# Patient Record
Sex: Female | Born: 1955 | Race: White | Hispanic: No | Smoking: Former smoker
Health system: Southern US, Community
[De-identification: ages and names within clinical notes are randomized; demographics above are authoritative.]

## PROBLEM LIST (undated history)

## (undated) DIAGNOSIS — R112 Nausea with vomiting, unspecified: Secondary | ICD-10-CM

## (undated) DIAGNOSIS — D649 Anemia, unspecified: Secondary | ICD-10-CM

## (undated) DIAGNOSIS — E039 Hypothyroidism, unspecified: Secondary | ICD-10-CM

## (undated) DIAGNOSIS — G4730 Sleep apnea, unspecified: Secondary | ICD-10-CM

## (undated) DIAGNOSIS — M1990 Unspecified osteoarthritis, unspecified site: Secondary | ICD-10-CM

## (undated) DIAGNOSIS — F329 Major depressive disorder, single episode, unspecified: Secondary | ICD-10-CM

## (undated) DIAGNOSIS — F32A Depression, unspecified: Secondary | ICD-10-CM

## (undated) DIAGNOSIS — Z9889 Other specified postprocedural states: Secondary | ICD-10-CM

## (undated) DIAGNOSIS — M5126 Other intervertebral disc displacement, lumbar region: Secondary | ICD-10-CM

## (undated) DIAGNOSIS — T4145XA Adverse effect of unspecified anesthetic, initial encounter: Secondary | ICD-10-CM

## (undated) DIAGNOSIS — Z8719 Personal history of other diseases of the digestive system: Secondary | ICD-10-CM

## (undated) DIAGNOSIS — E079 Disorder of thyroid, unspecified: Secondary | ICD-10-CM

## (undated) DIAGNOSIS — Z9289 Personal history of other medical treatment: Secondary | ICD-10-CM

## (undated) DIAGNOSIS — E785 Hyperlipidemia, unspecified: Secondary | ICD-10-CM

## (undated) DIAGNOSIS — T8859XA Other complications of anesthesia, initial encounter: Secondary | ICD-10-CM

## (undated) DIAGNOSIS — T7840XA Allergy, unspecified, initial encounter: Secondary | ICD-10-CM

## (undated) DIAGNOSIS — K219 Gastro-esophageal reflux disease without esophagitis: Secondary | ICD-10-CM

## (undated) DIAGNOSIS — R251 Tremor, unspecified: Secondary | ICD-10-CM

## (undated) DIAGNOSIS — F419 Anxiety disorder, unspecified: Secondary | ICD-10-CM

## (undated) HISTORY — PX: ABDOMINAL HYSTERECTOMY: SHX81

## (undated) HISTORY — DX: Allergy, unspecified, initial encounter: T78.40XA

## (undated) HISTORY — DX: Hyperlipidemia, unspecified: E78.5

## (undated) HISTORY — DX: Disorder of thyroid, unspecified: E07.9

## (undated) HISTORY — PX: CHOLECYSTECTOMY: SHX55

## (undated) HISTORY — PX: TUBAL LIGATION: SHX77

## (undated) HISTORY — DX: Sleep apnea, unspecified: G47.30

## (undated) HISTORY — DX: Tremor, unspecified: R25.1

## (undated) HISTORY — DX: Depression, unspecified: F32.A

## (undated) HISTORY — DX: Major depressive disorder, single episode, unspecified: F32.9

## (undated) HISTORY — PX: LIPOMA EXCISION: SHX5283

## (undated) HISTORY — PX: OOPHORECTOMY: SHX86

## (undated) HISTORY — DX: Anemia, unspecified: D64.9

## (undated) HISTORY — DX: Anxiety disorder, unspecified: F41.9

## (undated) HISTORY — DX: Personal history of other medical treatment: Z92.89

## (undated) HISTORY — DX: Other intervertebral disc displacement, lumbar region: M51.26

## (undated) HISTORY — PX: EYE SURGERY: SHX253

---

## 1998-01-22 ENCOUNTER — Other Ambulatory Visit: Admission: RE | Admit: 1998-01-22 | Discharge: 1998-01-22 | Payer: Self-pay | Admitting: Obstetrics and Gynecology

## 1998-06-26 ENCOUNTER — Ambulatory Visit (HOSPITAL_COMMUNITY): Admission: RE | Admit: 1998-06-26 | Discharge: 1998-06-26 | Payer: Self-pay | Admitting: Family Medicine

## 1998-06-26 ENCOUNTER — Encounter: Payer: Self-pay | Admitting: Family Medicine

## 1998-11-26 ENCOUNTER — Ambulatory Visit (HOSPITAL_COMMUNITY): Admission: RE | Admit: 1998-11-26 | Discharge: 1998-11-26 | Payer: Self-pay | Admitting: Gastroenterology

## 1998-11-26 ENCOUNTER — Encounter: Payer: Self-pay | Admitting: Gastroenterology

## 1998-11-28 ENCOUNTER — Other Ambulatory Visit: Admission: RE | Admit: 1998-11-28 | Discharge: 1998-11-28 | Payer: Self-pay | Admitting: Obstetrics and Gynecology

## 1998-12-16 ENCOUNTER — Ambulatory Visit (HOSPITAL_COMMUNITY): Admission: RE | Admit: 1998-12-16 | Discharge: 1998-12-17 | Payer: Self-pay | Admitting: General Surgery

## 1998-12-16 ENCOUNTER — Encounter: Payer: Self-pay | Admitting: General Surgery

## 1999-05-29 ENCOUNTER — Encounter: Admission: RE | Admit: 1999-05-29 | Discharge: 1999-05-29 | Payer: Self-pay | Admitting: Obstetrics and Gynecology

## 1999-09-02 ENCOUNTER — Ambulatory Visit (HOSPITAL_COMMUNITY): Admission: RE | Admit: 1999-09-02 | Discharge: 1999-09-02 | Payer: Self-pay | Admitting: Family Medicine

## 1999-09-02 ENCOUNTER — Encounter: Payer: Self-pay | Admitting: Family Medicine

## 1999-11-06 ENCOUNTER — Encounter: Payer: Self-pay | Admitting: Family Medicine

## 1999-11-06 ENCOUNTER — Ambulatory Visit: Admission: RE | Admit: 1999-11-06 | Discharge: 1999-11-06 | Payer: Self-pay | Admitting: Family Medicine

## 2000-02-27 ENCOUNTER — Other Ambulatory Visit: Admission: RE | Admit: 2000-02-27 | Discharge: 2000-02-27 | Payer: Self-pay | Admitting: Obstetrics and Gynecology

## 2000-05-31 ENCOUNTER — Encounter: Payer: Self-pay | Admitting: Obstetrics and Gynecology

## 2000-05-31 ENCOUNTER — Encounter: Admission: RE | Admit: 2000-05-31 | Discharge: 2000-05-31 | Payer: Self-pay | Admitting: Obstetrics and Gynecology

## 2000-10-10 ENCOUNTER — Ambulatory Visit (HOSPITAL_COMMUNITY): Admission: RE | Admit: 2000-10-10 | Discharge: 2000-10-10 | Payer: Self-pay | Admitting: Family Medicine

## 2000-10-10 ENCOUNTER — Encounter: Payer: Self-pay | Admitting: Family Medicine

## 2000-10-15 DIAGNOSIS — M5126 Other intervertebral disc displacement, lumbar region: Secondary | ICD-10-CM

## 2000-10-15 HISTORY — DX: Other intervertebral disc displacement, lumbar region: M51.26

## 2000-11-08 ENCOUNTER — Ambulatory Visit (HOSPITAL_COMMUNITY): Admission: RE | Admit: 2000-11-08 | Discharge: 2000-11-08 | Payer: Self-pay | Admitting: Neurosurgery

## 2000-11-08 ENCOUNTER — Encounter: Payer: Self-pay | Admitting: Neurosurgery

## 2000-11-29 ENCOUNTER — Encounter: Payer: Self-pay | Admitting: Neurosurgery

## 2000-11-29 ENCOUNTER — Encounter: Admission: RE | Admit: 2000-11-29 | Discharge: 2000-11-29 | Payer: Self-pay | Admitting: Neurosurgery

## 2000-12-13 ENCOUNTER — Encounter: Admission: RE | Admit: 2000-12-13 | Discharge: 2000-12-13 | Payer: Self-pay | Admitting: Neurosurgery

## 2000-12-13 ENCOUNTER — Encounter: Payer: Self-pay | Admitting: Neurosurgery

## 2001-04-08 ENCOUNTER — Other Ambulatory Visit: Admission: RE | Admit: 2001-04-08 | Discharge: 2001-04-08 | Payer: Self-pay | Admitting: Obstetrics and Gynecology

## 2001-05-31 ENCOUNTER — Encounter: Admission: RE | Admit: 2001-05-31 | Discharge: 2001-05-31 | Payer: Self-pay | Admitting: Obstetrics and Gynecology

## 2001-05-31 ENCOUNTER — Encounter: Payer: Self-pay | Admitting: Obstetrics and Gynecology

## 2002-05-12 ENCOUNTER — Other Ambulatory Visit: Admission: RE | Admit: 2002-05-12 | Discharge: 2002-05-12 | Payer: Self-pay | Admitting: Obstetrics and Gynecology

## 2002-06-02 ENCOUNTER — Encounter: Admission: RE | Admit: 2002-06-02 | Discharge: 2002-06-02 | Payer: Self-pay | Admitting: Obstetrics and Gynecology

## 2002-06-02 ENCOUNTER — Encounter: Payer: Self-pay | Admitting: Obstetrics and Gynecology

## 2002-09-07 ENCOUNTER — Emergency Department (HOSPITAL_COMMUNITY): Admission: EM | Admit: 2002-09-07 | Discharge: 2002-09-07 | Payer: Self-pay | Admitting: Emergency Medicine

## 2003-03-03 ENCOUNTER — Inpatient Hospital Stay (HOSPITAL_COMMUNITY): Admission: EM | Admit: 2003-03-03 | Discharge: 2003-03-05 | Payer: Self-pay | Admitting: Podiatry

## 2003-03-03 ENCOUNTER — Encounter: Payer: Self-pay | Admitting: *Deleted

## 2003-04-18 ENCOUNTER — Encounter: Payer: Self-pay | Admitting: Family Medicine

## 2003-04-18 LAB — CONVERTED CEMR LAB

## 2003-04-30 ENCOUNTER — Other Ambulatory Visit: Admission: RE | Admit: 2003-04-30 | Discharge: 2003-04-30 | Payer: Self-pay | Admitting: Obstetrics and Gynecology

## 2003-06-04 ENCOUNTER — Encounter: Admission: RE | Admit: 2003-06-04 | Discharge: 2003-06-04 | Payer: Self-pay | Admitting: Obstetrics and Gynecology

## 2003-06-04 ENCOUNTER — Encounter: Payer: Self-pay | Admitting: Obstetrics and Gynecology

## 2004-06-13 ENCOUNTER — Ambulatory Visit (HOSPITAL_COMMUNITY): Admission: RE | Admit: 2004-06-13 | Discharge: 2004-06-13 | Payer: Self-pay | Admitting: Obstetrics and Gynecology

## 2004-06-16 ENCOUNTER — Ambulatory Visit: Payer: Self-pay | Admitting: Family Medicine

## 2004-06-23 ENCOUNTER — Ambulatory Visit: Payer: Self-pay | Admitting: Family Medicine

## 2004-07-22 ENCOUNTER — Ambulatory Visit: Payer: Self-pay | Admitting: Family Medicine

## 2004-07-23 ENCOUNTER — Ambulatory Visit: Payer: Self-pay | Admitting: Internal Medicine

## 2004-08-05 ENCOUNTER — Ambulatory Visit: Payer: Self-pay | Admitting: Family Medicine

## 2004-11-11 ENCOUNTER — Other Ambulatory Visit: Admission: RE | Admit: 2004-11-11 | Discharge: 2004-11-11 | Payer: Self-pay | Admitting: Obstetrics and Gynecology

## 2005-02-05 ENCOUNTER — Ambulatory Visit: Payer: Self-pay | Admitting: Family Medicine

## 2005-03-17 HISTORY — PX: BLADDER SUSPENSION: SHX72

## 2005-04-01 ENCOUNTER — Observation Stay (HOSPITAL_COMMUNITY): Admission: RE | Admit: 2005-04-01 | Discharge: 2005-04-02 | Payer: Self-pay | Admitting: Obstetrics and Gynecology

## 2005-05-27 ENCOUNTER — Ambulatory Visit: Payer: Self-pay | Admitting: Family Medicine

## 2005-07-01 ENCOUNTER — Ambulatory Visit (HOSPITAL_COMMUNITY): Admission: RE | Admit: 2005-07-01 | Discharge: 2005-07-01 | Payer: Self-pay | Admitting: Obstetrics and Gynecology

## 2005-08-20 ENCOUNTER — Ambulatory Visit: Payer: Self-pay | Admitting: Family Medicine

## 2005-09-11 ENCOUNTER — Ambulatory Visit: Payer: Self-pay | Admitting: Family Medicine

## 2005-09-14 ENCOUNTER — Encounter: Admission: RE | Admit: 2005-09-14 | Discharge: 2005-09-14 | Payer: Self-pay | Admitting: General Surgery

## 2005-10-02 ENCOUNTER — Ambulatory Visit: Payer: Self-pay | Admitting: Family Medicine

## 2005-10-20 ENCOUNTER — Ambulatory Visit: Payer: Self-pay | Admitting: Family Medicine

## 2006-03-08 ENCOUNTER — Ambulatory Visit: Payer: Self-pay | Admitting: Family Medicine

## 2006-04-21 ENCOUNTER — Ambulatory Visit: Payer: Self-pay | Admitting: Family Medicine

## 2006-07-15 ENCOUNTER — Encounter: Admission: RE | Admit: 2006-07-15 | Discharge: 2006-07-15 | Payer: Self-pay | Admitting: Obstetrics and Gynecology

## 2007-02-02 ENCOUNTER — Telehealth: Payer: Self-pay | Admitting: Family Medicine

## 2007-02-03 ENCOUNTER — Encounter: Payer: Self-pay | Admitting: Family Medicine

## 2007-02-03 DIAGNOSIS — F329 Major depressive disorder, single episode, unspecified: Secondary | ICD-10-CM

## 2007-02-03 DIAGNOSIS — E039 Hypothyroidism, unspecified: Secondary | ICD-10-CM

## 2007-02-03 DIAGNOSIS — E785 Hyperlipidemia, unspecified: Secondary | ICD-10-CM

## 2007-02-03 DIAGNOSIS — G4700 Insomnia, unspecified: Secondary | ICD-10-CM

## 2007-02-03 DIAGNOSIS — K589 Irritable bowel syndrome without diarrhea: Secondary | ICD-10-CM

## 2007-02-15 ENCOUNTER — Ambulatory Visit: Payer: Self-pay | Admitting: Family Medicine

## 2007-02-16 ENCOUNTER — Encounter: Payer: Self-pay | Admitting: Family Medicine

## 2007-04-01 ENCOUNTER — Ambulatory Visit: Payer: Self-pay | Admitting: Internal Medicine

## 2007-04-01 DIAGNOSIS — B029 Zoster without complications: Secondary | ICD-10-CM | POA: Insufficient documentation

## 2007-04-05 ENCOUNTER — Telehealth (INDEPENDENT_AMBULATORY_CARE_PROVIDER_SITE_OTHER): Payer: Self-pay | Admitting: *Deleted

## 2007-04-27 ENCOUNTER — Ambulatory Visit: Payer: Self-pay | Admitting: Family Medicine

## 2007-04-29 ENCOUNTER — Encounter (INDEPENDENT_AMBULATORY_CARE_PROVIDER_SITE_OTHER): Payer: Self-pay | Admitting: *Deleted

## 2007-05-04 ENCOUNTER — Telehealth: Payer: Self-pay | Admitting: Family Medicine

## 2007-05-04 LAB — CONVERTED CEMR LAB
LDL Cholesterol: 98 mg/dL (ref 0–99)
TSH: 7.63 microintl units/mL — ABNORMAL HIGH (ref 0.35–5.50)
Total CHOL/HDL Ratio: 3.3

## 2007-06-13 ENCOUNTER — Encounter: Payer: Self-pay | Admitting: Family Medicine

## 2007-07-18 ENCOUNTER — Encounter: Admission: RE | Admit: 2007-07-18 | Discharge: 2007-07-18 | Payer: Self-pay | Admitting: Obstetrics and Gynecology

## 2007-07-21 ENCOUNTER — Ambulatory Visit: Payer: Self-pay | Admitting: Internal Medicine

## 2007-08-24 ENCOUNTER — Ambulatory Visit: Payer: Self-pay | Admitting: Family Medicine

## 2007-08-24 DIAGNOSIS — M79609 Pain in unspecified limb: Secondary | ICD-10-CM

## 2007-08-25 ENCOUNTER — Ambulatory Visit: Payer: Self-pay | Admitting: Family Medicine

## 2007-09-27 ENCOUNTER — Encounter: Payer: Self-pay | Admitting: Family Medicine

## 2007-09-28 ENCOUNTER — Ambulatory Visit: Payer: Self-pay | Admitting: Family Medicine

## 2007-09-28 DIAGNOSIS — H02409 Unspecified ptosis of unspecified eyelid: Secondary | ICD-10-CM | POA: Insufficient documentation

## 2007-10-31 ENCOUNTER — Telehealth: Payer: Self-pay | Admitting: Family Medicine

## 2007-11-11 ENCOUNTER — Encounter: Payer: Self-pay | Admitting: Family Medicine

## 2007-12-29 ENCOUNTER — Ambulatory Visit: Payer: Self-pay | Admitting: Family Medicine

## 2008-01-02 LAB — CONVERTED CEMR LAB
Free T4: 1.22 ng/dL (ref 0.89–1.80)
T3 Uptake Ratio: 33.6 % (ref 22.5–37.0)

## 2008-01-18 ENCOUNTER — Encounter: Payer: Self-pay | Admitting: Family Medicine

## 2008-01-23 ENCOUNTER — Encounter: Payer: Self-pay | Admitting: Family Medicine

## 2008-01-30 ENCOUNTER — Encounter: Payer: Self-pay | Admitting: Family Medicine

## 2008-02-10 ENCOUNTER — Telehealth: Payer: Self-pay | Admitting: Family Medicine

## 2008-02-14 ENCOUNTER — Ambulatory Visit: Payer: Self-pay | Admitting: Family Medicine

## 2008-03-13 ENCOUNTER — Telehealth (INDEPENDENT_AMBULATORY_CARE_PROVIDER_SITE_OTHER): Payer: Self-pay | Admitting: *Deleted

## 2008-03-14 ENCOUNTER — Ambulatory Visit: Payer: Self-pay | Admitting: Family Medicine

## 2008-03-14 DIAGNOSIS — M549 Dorsalgia, unspecified: Secondary | ICD-10-CM | POA: Insufficient documentation

## 2008-03-14 LAB — CONVERTED CEMR LAB
Bilirubin Urine: NEGATIVE
Ketones, urine, test strip: NEGATIVE
Mucus, UA: 0
Specific Gravity, Urine: 1.01
Urine crystals, microscopic: 0 /hpf
WBC, UA: 1 cells/hpf
Yeast, UA: 0
pH: 5

## 2008-03-19 ENCOUNTER — Encounter: Payer: Self-pay | Admitting: Family Medicine

## 2008-07-18 ENCOUNTER — Ambulatory Visit: Payer: Self-pay | Admitting: Family Medicine

## 2008-07-27 ENCOUNTER — Ambulatory Visit: Payer: Self-pay | Admitting: Licensed Clinical Social Worker

## 2008-08-07 ENCOUNTER — Ambulatory Visit: Payer: Self-pay | Admitting: Licensed Clinical Social Worker

## 2008-08-22 ENCOUNTER — Ambulatory Visit: Payer: Self-pay | Admitting: Family Medicine

## 2008-08-24 ENCOUNTER — Ambulatory Visit: Payer: Self-pay | Admitting: Licensed Clinical Social Worker

## 2008-08-30 ENCOUNTER — Telehealth (INDEPENDENT_AMBULATORY_CARE_PROVIDER_SITE_OTHER): Payer: Self-pay | Admitting: Internal Medicine

## 2008-09-07 ENCOUNTER — Ambulatory Visit: Payer: Self-pay | Admitting: Licensed Clinical Social Worker

## 2008-10-01 ENCOUNTER — Telehealth (INDEPENDENT_AMBULATORY_CARE_PROVIDER_SITE_OTHER): Payer: Self-pay | Admitting: *Deleted

## 2009-02-21 ENCOUNTER — Ambulatory Visit: Payer: Self-pay | Admitting: Family Medicine

## 2009-02-22 LAB — CONVERTED CEMR LAB
ALT: 17 units/L (ref 0–35)
BUN: 10 mg/dL (ref 6–23)
Calcium: 8.8 mg/dL (ref 8.4–10.5)
Chloride: 106 meq/L (ref 96–112)
Creatinine, Ser: 0.7 mg/dL (ref 0.4–1.2)
Glucose, Bld: 90 mg/dL (ref 70–99)
Potassium: 4.4 meq/L (ref 3.5–5.1)
Total CHOL/HDL Ratio: 5
VLDL: 27.2 mg/dL (ref 0.0–40.0)

## 2009-05-29 ENCOUNTER — Ambulatory Visit: Payer: Self-pay | Admitting: Family Medicine

## 2009-05-30 LAB — CONVERTED CEMR LAB
Total CHOL/HDL Ratio: 7
Triglycerides: 224 mg/dL — ABNORMAL HIGH (ref 0.0–149.0)

## 2009-06-11 ENCOUNTER — Ambulatory Visit: Payer: Self-pay | Admitting: Family Medicine

## 2009-06-12 ENCOUNTER — Telehealth (INDEPENDENT_AMBULATORY_CARE_PROVIDER_SITE_OTHER): Payer: Self-pay | Admitting: Internal Medicine

## 2009-07-16 ENCOUNTER — Ambulatory Visit: Payer: Self-pay | Admitting: Family Medicine

## 2009-07-16 ENCOUNTER — Encounter (INDEPENDENT_AMBULATORY_CARE_PROVIDER_SITE_OTHER): Payer: Self-pay | Admitting: Internal Medicine

## 2009-07-18 LAB — CONVERTED CEMR LAB
ALT: 19 units/L (ref 0–35)
AST: 19 units/L (ref 0–37)
HDL: 58 mg/dL (ref >39)
LDL Cholesterol: 93 mg/dL (ref 0–99)
Total CHOL/HDL Ratio: 2.9
VLDL: 15 mg/dL (ref 0–40)

## 2010-01-22 ENCOUNTER — Ambulatory Visit: Payer: Self-pay | Admitting: Family Medicine

## 2010-01-22 LAB — CONVERTED CEMR LAB
Cholesterol: 186 mg/dL (ref 0–200)
LDL Cholesterol: 110 mg/dL — ABNORMAL HIGH (ref 0–99)
Triglycerides: 106 mg/dL (ref 0.0–149.0)

## 2010-02-28 ENCOUNTER — Ambulatory Visit: Payer: Self-pay | Admitting: Family Medicine

## 2010-03-17 DIAGNOSIS — D649 Anemia, unspecified: Secondary | ICD-10-CM

## 2010-03-17 HISTORY — DX: Anemia, unspecified: D64.9

## 2010-03-31 ENCOUNTER — Encounter: Payer: Self-pay | Admitting: Family Medicine

## 2010-03-31 ENCOUNTER — Ambulatory Visit: Payer: Self-pay | Admitting: Family Medicine

## 2010-03-31 DIAGNOSIS — D649 Anemia, unspecified: Secondary | ICD-10-CM

## 2010-04-01 LAB — CONVERTED CEMR LAB
Basophils Relative: 1 % (ref 0–1)
Eosinophils Relative: 1 % (ref 0–5)
Lymphocytes Relative: 39 % (ref 12–46)
MCHC: 32.7 g/dL (ref 30.0–36.0)
Monocytes Absolute: 0.5 10*3/uL (ref 0.1–1.0)
Monocytes Relative: 8 % (ref 3–12)
Neutro Abs: 3.3 10*3/uL (ref 1.7–7.7)
Neutrophils Relative %: 52 % (ref 43–77)
Platelets: 300 10*3/uL (ref 150–400)
RDW: 12.3 % (ref 11.5–15.5)
WBC: 6.4 10*3/uL (ref 4.0–10.5)

## 2010-04-07 ENCOUNTER — Ambulatory Visit: Payer: Self-pay | Admitting: Family Medicine

## 2010-04-08 ENCOUNTER — Encounter (INDEPENDENT_AMBULATORY_CARE_PROVIDER_SITE_OTHER): Payer: Self-pay | Admitting: *Deleted

## 2010-04-29 ENCOUNTER — Encounter (INDEPENDENT_AMBULATORY_CARE_PROVIDER_SITE_OTHER): Payer: Self-pay | Admitting: *Deleted

## 2010-05-07 ENCOUNTER — Ambulatory Visit: Payer: Self-pay | Admitting: Family Medicine

## 2010-05-07 ENCOUNTER — Encounter: Payer: Self-pay | Admitting: Family Medicine

## 2010-05-08 ENCOUNTER — Ambulatory Visit: Payer: Self-pay | Admitting: Family Medicine

## 2010-05-22 ENCOUNTER — Encounter: Payer: Self-pay | Admitting: Internal Medicine

## 2010-05-27 ENCOUNTER — Encounter (INDEPENDENT_AMBULATORY_CARE_PROVIDER_SITE_OTHER): Payer: Self-pay | Admitting: *Deleted

## 2010-05-30 ENCOUNTER — Ambulatory Visit: Payer: Self-pay | Admitting: Internal Medicine

## 2010-06-11 ENCOUNTER — Telehealth: Payer: Self-pay | Admitting: Internal Medicine

## 2010-06-12 ENCOUNTER — Telehealth: Payer: Self-pay | Admitting: Family Medicine

## 2010-06-13 ENCOUNTER — Ambulatory Visit: Payer: Self-pay | Admitting: Internal Medicine

## 2010-06-13 LAB — HM COLONOSCOPY

## 2010-06-13 LAB — HM SIGMOIDOSCOPY

## 2010-06-16 ENCOUNTER — Encounter: Payer: Self-pay | Admitting: Internal Medicine

## 2010-09-07 ENCOUNTER — Encounter: Payer: Self-pay | Admitting: Obstetrics and Gynecology

## 2010-09-08 ENCOUNTER — Telehealth: Payer: Self-pay | Admitting: Family Medicine

## 2010-09-18 NOTE — Assessment & Plan Note (Signed)
Summary: ESTABH FROM BILLIE/DLO   Vital Signs:  Patient profile:   55 year old female Height:      63.25 inches Weight:      133.50 pounds BMI:     23.55 Temp:     98.3 degrees F oral Pulse rate:   80 / minute Pulse rhythm:   regular BP sitting:   104 / 60  (left arm) Cuff size:   large  Vitals Entered By: Delilah Shan CMA Kasheena Sambrano Dull) (February 28, 2010 11:25 AM) CC: Establish from BDB   History of Present Illness: Elevated Cholesterol: Using medications without problems:yes Muscle aches: no Other complaints: no  Hypothyroid.  needs refill of synthroid 112 micrograms.  due for labs.  No symptoms of overt under or over replacement.   depression/anxiety- stable on current meds.  symptoms increase when she has gone off the pristiq. Uses ativan as needed with good effect.  Doesn't need refills on these meds.   Problems Prior to Update: 1)  Back Pain  (ICD-724.5) 2)  Headache, L Sided Periorbital  (ICD-784.0) 3)  Unspecified Ptosis of Eyelid  (ICD-374.30) 4)  Leg Pain, Left  (ICD-729.5) 5)  Shingles  (ICD-053.9) 6)  Hx of Insomnia  (ICD-780.52) 7)  Hx of Ibs  (ICD-564.1) 8)  Hypothyroidism  (ICD-244.9) 9)  Hyperlipidemia  (ICD-272.4) 10)  Depression  (ICD-311)  Current Medications (verified): 1)  Synthroid 112 Mcg  Tabs (Levothyroxine Sodium) .... One By Mouth Every Day 2)  Ativan 2 Mg Tabs (Lorazepam) .... Take One By Mouth At Bedtime 3)  Pristiq 50 Mg Xr24h-Tab (Desvenlafaxine Succinate) .Marland Kitchen.. 1 Once Daily For Depresion By Mouth 4)  Calcium Carbonate-Vitamin D 600-400 Mg-Unit  Tabs (Calcium Carbonate-Vitamin D) .... Two Daily 5)  Multivitamins   Tabs (Multiple Vitamin) .... One Daily 6)  Flax   Oil (Flaxseed (Linseed)) .... Once Daily 7)  Simvastatin 20 Mg Tabs (Simvastatin) .... Take 1 Each Day  Allergies: 1)  ! * Evista 2)  Lipitor 3)  Paxil 4)  * Remeron 5)  Wellbutrin 6)  * Amitriptyline 7)  * Vytorin 8)  * Zegerid 9)  * Crestor  Past History:  Family  History: Last updated: 02/28/2010 Father: unknown Mother: HTN, DM, goiter, breast cancer, MI at age 29 Siblings: sister w/ breast ca  Social History: Last updated: 02/28/2010 Marital Status: Divorced Children: 2 Occupation  Research officer, political party at [employer] Former Smoker, quit  ~2006, was prev occ smoker From [place] Occ alcohol   Past Medical History: Depression Hyperlipidemia Hypothyroidism Gyn care per Dr. 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  Otherwise noncontributory.    Physical Exam  General:  GEN: nad, alert and oriented HEENT: mucous membranes moist NECK: supple w/o LA CV: rrr.  no murmur PULM: ctab, no inc wob ABD: soft, +bs EXT: no edema SKIN: no acute rash    Impression & Recommendations:  Problem # 1:  HYPOTHYROIDISM (ICD-244.9) Contact with labs.  Rx done today.   Her updated medication list for this problem includes:    Synthroid 112 Mcg Tabs (Levothyroxine sodium) ..... One by mouth every day  Orders: TLB-TSH (Thyroid Stimulating Hormone) (84443-TSH)  Problem #  2:  HYPERLIPIDEMIA (ICD-272.4) Stable,  continue current meds. Recent labs reviewed.  Her updated medication list for this problem includes:    Simvastatin 20 Mg Tabs (Simvastatin) .Marland Kitchen... Take 1 each day  Complete Medication List: 1)  Synthroid 112 Mcg Tabs (Levothyroxine sodium) .... One by mouth every day 2)  Ativan 2 Mg Tabs (Lorazepam) .... Take one by mouth at bedtime 3)  Pristiq 50 Mg Xr24h-tab (Desvenlafaxine succinate) .Marland Kitchen.. 1 once daily for depresion by mouth 4)  Calcium Carbonate-vitamin D 600-400 Mg-unit Tabs (Calcium carbonate-vitamin d) .... Two daily 5)   Multivitamins Tabs (Multiple vitamin) .... One daily 6)  Flax Oil (Flaxseed (linseed)) .... Once daily 7)  Simvastatin 20 Mg Tabs (Simvastatin) .... Take 1 each day  Patient Instructions: 1)  Please schedule a follow-up appointment as needed .  I would recheck your cholesterol and TSH in 12 months.  Don't change your meds.  I sent your synthroid to the pharmacy.  We'll contact you with your lab report.  Take care.  Prescriptions: SYNTHROID 112 MCG  TABS (LEVOTHYROXINE SODIUM) one by mouth every day  #90 x 3   Entered and Authorized by:   Crawford Givens MD   Signed by:   Crawford Givens MD on 02/28/2010   Method used:   Electronically to        [facility] [address] [phone].* (retail)       [address]       [place], [place]  [address]       Ph: [phone]       Fax: [phone]   RxID:   [id number]   Current Allergies (reviewed today): ! * EVISTA LIPITOR PAXIL * REMERON WELLBUTRIN * AMITRIPTYLINE * VYTORIN * ZEGERID * CRESTOR

## 2010-09-18 NOTE — Assessment & Plan Note (Signed)
Summary: DISCUSS ?LABWORK/CLE   Vital Signs:  Patient profile:   55 year old female Height:      63.25 inches Weight:      135 pounds BMI:     23.81 Temp:     98.1 degrees F oral Pulse rate:   76 / minute Pulse rhythm:   regular BP sitting:   112 / 60  (left arm) Cuff size:   regular  Vitals Entered By: Delilah Shan CMA Yaelis Scharfenberg Dull) (March 31, 2010 10:55 AM) CC: Discuss labs   History of Present Illness: Anemia- couldn't give blood last week due to anemia. Hgb 11.2 per patient.  Occ dizzy sensation, intermittent during last month.   H/o nosebleeds.  H/o hysterectomy.  No BRBPR.  No tar colored stools.  Fatigue noted by patient.  No other FH of anemia.   No aspirin, rare NSAIDS.  Never had colonoscopy.   Had been giving blood 2x/year.   Allergies: 1)  ! * Evista 2)  Lipitor 3)  Paxil 4)  * Remeron 5)  Wellbutrin 6)  * Amitriptyline 7)  * Vytorin 8)  * Zegerid 9)  * Crestor  Past History:  Past Medical History: Depression Hyperlipidemia Hypothyroidism Gyn care per Dr. Huntley Dec Anemia 03/2010  Review of Systems       See HPI.  Otherwise negative.    Physical Exam  General:  GEN: nad, alert and oriented HEENT: mucous membranes moist, tm wnl, nasal epithelium wnl NECK: supple w/o LA CV: rrr.  no murmur PULM: ctab, no inc wob ABD: soft, +bs EXT: no edema SKIN: no acute rash    Impression & Recommendations:  Problem # 1:  ANEMIA (ICD-285.9) New problem.  Will check labs and contact patient.  Check stool heme and refer to GI.  Would need colon CA screening even if she didn't have anemia.  Addendum- patient had mild episode of bleeding through the bandage after lab draw.  Quickly controlled and routine dressing applied with good effect.  tolerated well.  Orders: Specimen Handling ([address]) T-CBC w/Diff [phone]) T-Iron [phone]) T-Vitamin B12 [phone]) T- * Misc. Laboratory test [phone])  Complete Medication List: 1)  Synthroid 112 Mcg Tabs  (Levothyroxine sodium) .... One by mouth every day except 1/2 tab on sundays 2)  Ativan 2 Mg Tabs (Lorazepam) .... Take one by mouth at bedtime 3)  Pristiq 50 Mg Xr24h-tab (Desvenlafaxine succinate) .Marland Kitchen.. 1 once daily for depresion by mouth 4)  Calcium Carbonate-vitamin D 600-400 Mg-unit Tabs (Calcium carbonate-vitamin d) .... Two daily 5)  Multivitamins Tabs (Multiple vitamin) .... One daily 6)  Flax Oil (Flaxseed (linseed)) .... Once daily 7)  Simvastatin 20 Mg Tabs (Simvastatin) .... Take 1 each day  Other Orders: Gastroenterology Referral (GI)  Patient Instructions: 1)  See Shirlee Limerick about your referral before your leave today.   2)  We'll contact you with your lab report.   Current Allergies (reviewed today): ! * EVISTA LIPITOR PAXIL * REMERON WELLBUTRIN * AMITRIPTYLINE * VYTORIN * ZEGERID * CRESTOR

## 2010-09-18 NOTE — Letter (Signed)
Summary: [facility]: Immunoassay Fecal Occult Blood (iFOB) Order Form  [place] [facility]  [address] [place], [place] [address]   Phone: [phone]  Fax: [phone]      [facility]: Immunoassay Fecal Occult Blood (iFOB) Order Form   March 31, 2010 MRN: [id number]   Abigail Marks 08-13-1956   Physicican Name:_________duncan________________  Diagnosis Code:__________v76.49________________      Crawford Givens MD

## 2010-09-18 NOTE — Letter (Signed)
Summary: Patient Notice- Polyp Results  [facility]  [address] [place], [place] [address]   Phone: [phone]  Fax: [phone]        June 16, 2010 MRN: [id number]    Abigail Marks [address] [place], [place]  [address]    Dear Abigail Marks,  I am pleased to inform you that the colon polyp(s) removed during your recent colonoscopy was (were) found to be benign (no cancer detected) upon pathologic examination.The polyps were hyperplasctic ( not precancerous)  I recommend you have a repeat colonoscopy examination in 10_ years to look for recurrent polyps, as having colon polyps increases your risk for having recurrent polyps or even colon cancer in the future.  Should you develop new or worsening symptoms of abdominal pain, bowel habit changes or bleeding from the rectum or bowels, please schedule an evaluation with either your primary care physician or with me.  Additional information/recommendations:  __x No further action with gastroenterology is needed at this time. Please      follow-up with your primary care physician for your other healthcare      needs.  __ Please call [phone] to schedule a return visit to review your      situation.  __ Please keep your follow-up visit as already scheduled.  __ Continue treatment plan as outlined the day of your exam.  Please call us if you are having persistent problems or have questions about your condition that have not been fully answered at this time.  Sincerely,  Hart Carwin MD  This letter has been electronically signed by your physician.  Appended Document: Patient Notice- Polyp Results letter mailed

## 2010-09-18 NOTE — Letter (Signed)
Summary: [facility] Instructions  [facility]  [address] [place], [place] [address]   Phone: [phone]  Fax: [phone]       GLORIAJEAN OKUN    [birth year]    MRN: [id number]       Procedure Day /Date:  Friday 06/13/2010     Arrival Time: 8:00 am     Procedure Time: 9:00 am     Location of Procedure:                    _x _  [facility] (4th Floor)    PREPARATION FOR COLONOSCOPY WITH MIRALAX  Starting 5 days prior to your procedure Sunday 10/23 do not eat nuts, seeds, popcorn, corn, beans, peas,  salads, or any raw vegetables.  Do not take any fiber supplements (e.g. Metamucil, Citrucel, and Benefiber). ____________________________________________________________________________________________________   THE DAY BEFORE YOUR PROCEDURE         DATE: Thursday 10/27  1   Drink clear liquids the entire day-NO SOLID FOOD  2   Do not drink anything colored red or purple.  Avoid juices with pulp.  No orange juice.  3   Drink at least 64 oz. (8 glasses) of fluid/clear liquids during the day to prevent dehydration and help the prep work efficiently.  CLEAR LIQUIDS INCLUDE: Water Jello Ice Popsicles Tea (sugar ok, no milk/cream) Powdered fruit flavored drinks Coffee (sugar ok, no milk/cream) Gatorade Juice: apple, white grape, white cranberry  Lemonade Clear bullion, consomm, broth Carbonated beverages (any kind) Strained chicken noodle soup Hard Candy  4   Mix the entire bottle of Miralax with 64 oz. of Gatorade/Powerade in the morning and put in the refrigerator to chill.  5   At 3:00 pm take 2 Dulcolax/Bisacodyl tablets.  6   At 4:30 pm take one Reglan/Metoclopramide tablet.  7  Starting at 5:00 pm drink one 8 oz glass of the Miralax mixture every 15-20 minutes until you have finished drinking the entire 64 oz.  You should finish drinking prep around 7:30 or 8:00 pm.  8   If you are nauseated, you may take the 2nd Reglan/Metoclopramide  tablet at 6:30 pm.        9    At 8:00 pm take 2 more DULCOLAX/Bisacodyl tablets.     THE DAY OF YOUR PROCEDURE      DATE:  Friday 10/28  You may drink clear liquids until 7:00 am  (2 HOURS BEFORE PROCEDURE).   MEDICATION INSTRUCTIONS  Unless otherwise instructed, you should take regular prescription medications with a small sip of water as early as possible the morning of your procedure.   Additional medication instructions: n/a         OTHER INSTRUCTIONS  You will need a responsible adult at least 57 years of age to accompany you and drive you home.   This person must remain in the waiting room during your procedure.  Wear loose fitting clothing that is easily removed.  Leave jewelry and other valuables at home.  However, you may wish to bring a book to read or an iPod/MP3 player to listen to music as you wait for your procedure to start.  Remove all body piercing jewelry and leave at home.  Total time from sign-in until discharge is approximately 2-3 hours.  You should go home directly after your procedure and rest.  You can resume normal activities the day after your procedure.  The day of your procedure you should not:  Drive   Make legal decisions   Operate machinery   Drink alcohol   Return to work  You will receive specific instructions about eating, activities and medications before you leave.   The above instructions have been reviewed and explained to me by   Sherren Kerns RN  May 30, 2010 4:28 PM   I fully understand and can verbalize these instructions _____________________________ Date _______

## 2010-09-18 NOTE — Procedures (Signed)
Summary: Colonoscopy  Patient: [name] Note: All result statuses are Final unless otherwise noted.  Tests: (1) Colonoscopy (COL)   COL Colonoscopy           DONE     [facility]     [address]     [place], [place]  [address]           COLONOSCOPY PROCEDURE REPORT           PATIENT:  Abigail Marks, Abigail Marks  MR#:  [id number]     BIRTHDATE:  Jun 12, 1956, 53 yrs. old  GENDER:  female     ENDOSCOPIST:  Hedwig Morton. Juanda Chance, MD     REF. BY:  Crawford Givens, M.D.     PROCEDURE DATE:  06/13/2010     PROCEDURE:  Colonoscopy 19147     ASA CLASS:  Class I     INDICATIONS:  Routine Risk Screening     MEDICATIONS:   Versed 10 mg, Fentanyl 100 mcg           DESCRIPTION OF PROCEDURE:   After the risks benefits and     alternatives of the procedure were thoroughly explained, informed     consent was obtained.  Digital rectal exam was performed and     revealed no rectal masses.   The LB [id number] endoscope     was introduced through the anus and advanced to the cecum, which     was identified by both the appendix and ileocecal valve, without     limitations.  The quality of the prep was excellent, using     MiraLax.  The instrument was then slowly withdrawn as the colon     was fully examined.     <<PROCEDUREIMAGES>>           FINDINGS:  Two polyps were found. at 15 and 20 cm 2-3 mm sessile     polyps The polyps were removed using cold biopsy forceps (see     image5 and image6).  This was otherwise a normal examination of     the colon (see image7, image4, image3, image2, and image1).     Retroflexed views in the rectum revealed no abnormalities.    The     scope was then withdrawn from the patient and the procedure     completed.           COMPLICATIONS:  None     ENDOSCOPIC IMPRESSION:     1) Two polyps     2) Otherwise normal examination     RECOMMENDATIONS:     1) Await pathology results     2) High fiber diet.     REPEAT EXAM:  In 5 - 10 year(s) for.  recall  interval pending     polyp results           ______________________________     Hedwig Morton. Juanda Chance, MD           CC:           n.     eSIGNED:   Hedwig Morton. Brodie at 06/13/2010 09:29 AM           Berenice Bouton, [id number]  Note: An exclamation mark (!) indicates a result that was not dispersed into the flowsheet. Document Creation Date: 06/13/2010 9:30 AM _______________________________________________________________________  (1) Order result status: Final Collection or observation date-time: 06/13/2010 09:23 Requested date-time:  Receipt date-time:  Reported date-time:  Referring Physician:   Ordering Physician: Verlee Monte  Juanda Chance [phone]) Specimen Source:  Source: Abigail Marks Order Number: [id number] Lab site:   Appended Document: Colonoscopy     Procedures Next Due Date:    Colonoscopy: 06/2020

## 2010-09-18 NOTE — Miscellaneous (Signed)
Summary: previsit prep/rm  Clinical Lists Changes  Medications: Added new medication of MIRALAX   POWD (POLYETHYLENE GLYCOL 3350) As per prep  instructions. - Signed Added new medication of DULCOLAX 5 MG  TBEC (BISACODYL) Day before procedure take 2 at 3pm and 2 at 8pm. - Signed Added new medication of REGLAN 10 MG  TABS (METOCLOPRAMIDE HCL) As per prep instructions. - Signed Rx of MIRALAX   POWD (POLYETHYLENE GLYCOL 3350) As per prep  instructions.;  #255gm x 0;  Signed;  Entered by: Sherren Kerns RN;  Authorized by: Hart Carwin MD;  Method used: Electronically to [facility] [address] [phone].*, [address] [place], [place], [place]  [address], Ph: [phone], Fax: [phone] Rx of DULCOLAX 5 MG  TBEC (BISACODYL) Day before procedure take 2 at 3pm and 2 at 8pm.;  #4 x 0;  Signed;  Entered by: Sherren Kerns RN;  Authorized by: Hart Carwin MD;  Method used: Electronically to [facility] [address] [phone].*, [address] [place], [place], [place]  [address], Ph: [phone], Fax: [phone] Rx of REGLAN 10 MG  TABS (METOCLOPRAMIDE HCL) As per prep instructions.;  #2 x 0;  Signed;  Entered by: Sherren Kerns RN;  Authorized by: Hart Carwin MD;  Method used: Electronically to [facility] [address] [phone].*, [address] [place], [place], [place]  [address], Ph: [phone], Fax: [phone] Observations: Added new observation of ALLERGY REV: Done (05/30/2010 16:07)    Prescriptions: REGLAN 10 MG  TABS (METOCLOPRAMIDE HCL) As per prep instructions.  #2 x 0   Entered by:   Sherren Kerns RN   Authorized by:   Hart Carwin MD   Signed by:   Sherren Kerns RN on 05/30/2010   Method used:   Electronically to        [facility] [address] [phone].* (retail)       [address]       [place], [place]  [address]       Ph: [phone]       Fax: [phone]   RxID:   [id number] DULCOLAX 5 MG  TBEC (BISACODYL) Day before procedure take 2 at  3pm and 2 at 8pm.  #4 x 0   Entered by:   Sherren Kerns RN   Authorized by:   Hart Carwin MD   Signed by:   Sherren Kerns RN on 05/30/2010   Method used:   Electronically to        [facility] [address] [phone].* (retail)       [address]       [place], [place]  [address]       Ph: [phone]       Fax: [phone]   RxID:   [id number] MIRALAX   POWD (POLYETHYLENE GLYCOL 3350) As per prep  instructions.  #255gm x 0   Entered by:   Sherren Kerns RN   Authorized by:   Hart Carwin MD   Signed by:   Sherren Kerns RN on 05/30/2010   Method used:   Electronically to        [facility] [address] [phone].* (retail)       [address]       [place], [place]  [address]       Ph: [phone]       Fax: [phone]   RxID:   [id number]

## 2010-09-18 NOTE — Letter (Signed)
Summary: Pre Visit Letter Revised  [facility]  [address] [place], [place] [address]   Phone: [phone]  Fax: [phone]        05/22/2010 MRN: [id number]  Abigail Marks [address] [place], [place]  [address]                              Procedure Date:  10-28 9am Welcome to the Gastroenterology Division at [facility].    You are scheduled to see a nurse for your pre-procedure visit on 05-30-10 at 4:30pm on the 3rd floor at [facility], [address]  We ask that you try to arrive at our office 15 minutes prior to your appointment time to allow for check-in.  Please take a minute to review the attached form.  If you answer "Yes" to one or more of the questions on the first page, we ask that you call the person listed at your earliest opportunity.  If you answer "No" to all of the questions, please complete the rest of the form and bring it to your appointment.    Your nurse visit will consist of discussing your medical and surgical history, your immediate family medical history, and your medications.   If you are unable to list all of your medications on the form, please bring the medication bottles to your appointment and we will list them.  We will need to be aware of both prescribed and over the counter drugs.  We will need to know exact dosage information as well.    Please be prepared to read and sign documents such as consent forms, a financial agreement, and acknowledgement forms.  If necessary, and with your consent, a friend or relative is welcome to sit-in on the nurse visit with you.  Please bring your insurance card so that we may make a copy of it.  If your insurance requires a referral to see a specialist, please bring your referral form from your primary care physician.  No co-pay is required for this nurse visit.     If you cannot keep your appointment, please call [phone] to cancel or reschedule prior to your  appointment date.  This allows [place] the opportunity to schedule an appointment for another patient in need of care.    Thank you for choosing  Gastroenterology for your medical needs.  We appreciate the opportunity to care for you.  Please visit [place] at our website  to learn more about our practice.  Sincerely, The Gastroenterology Division

## 2010-09-18 NOTE — Letter (Signed)
Summary: [place] No Show Letter   at [facility]  [address] [place], [place] [address]   Phone: [phone]  Fax: [phone]    04/29/2010 MRN: [id number]  Abigail Marks [address] [place], [place]  [address]   Dear [name],   Our records indicate that you missed your scheduled appointment with ______Lab_______________ on ___9-9-11_________.  Please contact this office to reschedule your appointment as soon as possible.  It is important that you keep your scheduled appointments with your physician, so we can provide you the best care possible.  Please be advised that there may be a charge for "no show" appointments.    Sincerely,    at [facility]

## 2010-09-18 NOTE — Progress Notes (Signed)
Summary: RESEND PREP  ---- Converted from flag ---- ---- 06/11/2010 8:47 AM, Vallarie Mare wrote: please resend prep... pharmacy verified ------------------------------  Phone Note From Pharmacy   Caller: [facility] [address] [phone].* Call For: PREVISIT  Request: Resend Prescription Summary of Call: Miralax Rx sent Initial call taken by: Wyona Almas RN,  June 11, 2010 2:18 PM    New/Updated Medications: MIRALAX   POWD (POLYETHYLENE GLYCOL 3350) As per prep  instructions. METOCLOPRAMIDE HCL 10 MG  TABS (METOCLOPRAMIDE HCL) As per prep instructions. DULCOLAX 5 MG  TBEC (BISACODYL) Day before procedure take 2 at 3pm and 2 at 8pm. Prescriptions: DULCOLAX 5 MG  TBEC (BISACODYL) Day before procedure take 2 at 3pm and 2 at 8pm.  #4 x 0   Entered by:   Wyona Almas RN   Authorized by:   Hart Carwin MD   Signed by:   Wyona Almas RN on 06/11/2010   Method used:   Electronically to        [facility] [address] [phone].* (retail)       [address]       [place], [place]  [address]       Ph: [phone]       Fax: [phone]   RxID:   [id number] METOCLOPRAMIDE HCL 10 MG  TABS (METOCLOPRAMIDE HCL) As per prep instructions.  #2 x 0   Entered by:   Wyona Almas RN   Authorized by:   Hart Carwin MD   Signed by:   Wyona Almas RN on 06/11/2010   Method used:   Electronically to        [facility] [address] [phone].* (retail)       [address]       [place], [place]  [address]       Ph: [phone]       Fax: [phone]   RxID:   [id number] MIRALAX   POWD (POLYETHYLENE GLYCOL 3350) As per prep  instructions.  #255gm x 0   Entered by:   Wyona Almas RN   Authorized by:   Hart Carwin MD   Signed by:   Wyona Almas RN on 06/11/2010   Method used:   Electronically to        [facility] [address] [phone].* (retail)       [address]       [place], [place]  [address]      Ph: [phone]       Fax: [phone]   RxID:   [id number]

## 2010-09-18 NOTE — Assessment & Plan Note (Signed)
Summary: ??shingles/alc   Vital Signs:  Patient profile:   55 year old female Height:      63.25 inches Weight:      136 pounds BMI:     23.99 Temp:     97.5 degrees F oral Pulse rate:   80 / minute Pulse rhythm:   regular BP sitting:   112 / 80  (left arm) Cuff size:   regular  Vitals Entered ByMelody Comas (May 08, 2010 3:51 PM) CC: shingles   History of Present Illness: H/o singles, now with similar symptoms, itching and rash on dermatomal distribution on R arm.  No FCNAV currently.  antecedent uri symptoms.   Allergies: 1)  ! * Evista 2)  Lipitor 3)  Paxil 4)  * Remeron 5)  Wellbutrin 6)  * Amitriptyline 7)  * Vytorin 8)  * Zegerid 9)  * Crestor  Review of Systems       See HPI.  Otherwise negative.    Physical Exam  General:  GEN: nad, alert and oriented HEENT: mucous membranes moist, tm wnl NECK: supple w/o LA SKIN: red lesion w/o crust in dermatomal distribution on R posterior arm.    Impression & Recommendations:  Problem # 1:  SHINGLES (ICD-053.9) Presumed shingles.  tx with valtrex as before and follow up as needed.  She agrees.   Complete Medication List: 1)  Synthroid 112 Mcg Tabs (Levothyroxine sodium) .... One by mouth every day except 1/2 tab on "sundays 2)  Ativan 2 Mg Tabs (Lorazepam) .... Take one by mouth at bedtime 3)  Pristiq 50 Mg Xr24h-tab (Desvenlafaxine succinate) .... 1 once daily for depresion by mouth 4)  Calcium Carbonate-vitamin D 600-400 Mg-unit Tabs (Calcium carbonate-vitamin d) .... Two daily 5)  Multivitamins Tabs (Multiple vitamin) .... One daily 6)  Flax Oil (Flaxseed (linseed)) .... Once daily 7)  Simvastatin 20 Mg Tabs (Simvastatin) .... Take 1 each day 8)  Brain Boost  .... Take one by mouth daily 9)  Valtrex 1 Gm Tabs (Valacyclovir hcl) .... 1 by mouth three times a day x7d  Patient Instructions: 1)  Take the valtrex and let us know if you aren't getting better.  Take care.  Prescriptions: VALTREX 1 GM  TABS (VALACYCLOVIR HCL) 1 by mouth three times a day x7d  #21 x 1   Entered and Authorized by:   Graham Duncan MD   Signed by:   Graham Duncan MD on 05/08/2010   Method used:   Electronically to        [facility]  [id number] (retail)       [address]       [place], [place]  [address]       Ph: [phone]       Fax: [phone]   RxID:   [id number]   Current Allergies (reviewed today): ! * EVISTA LIPITOR PAXIL * REMERON WELLBUTRIN * AMITRIPTYLINE * VYTORIN * ZEGERID * CRESTOR

## 2010-09-18 NOTE — Letter (Signed)
Summary: Results Follow up Letter  [place] at [facility]  [address] [place], [place] [address]   Phone: [phone]  Fax: [phone]    04/08/2010 MRN: [id number]    Abigail Marks [address] [place], [place]  [address]    Dear [name],  The following are the results of your recent test(s):  Test         Result    Pap Smear:        Normal _____  Not Normal _____ Comments: ______________________________________________________ Cholesterol: LDL(Bad cholesterol):         Your goal is less than:         HDL (Good cholesterol):       Your goal is more than: Comments:  ______________________________________________________ Mammogram:        Normal _____  Not Normal _____ Comments:  ___________________________________________________________________ Hemoccult:        Normal ___X__  Not normal _______ Comments:    _____________________________________________________________________ Other Tests:    We routinely do not discuss normal results over the telephone.  If you desire a copy of the results, or you have any questions about this information we can discuss them at your next office visit.   Sincerely,   Dwana Curd. Para March, M.D.  [facility]

## 2010-09-18 NOTE — Progress Notes (Signed)
Summary: wants something to help with stress  Phone Note Call from Patient   Caller: Patient Call For: Crawford Givens MD Summary of Call: Patient says that she has alot of stress in her life right now. Her mother passed away 3 weeks ago and is having a hard time dealing with that. She is asking if she could get something called in to help her through this. [facility]ses [facility] [address].  Initial call taken by: Melody Comas,  September 08, 2010 11:26 AM  Follow-up for Phone Call        Please extend my concern re: patient and family.  See how much ativan she has.  She can take that two-three times a day as needed wtih sedation caution.  If she doesn't have any left, please call in 30, 1 rf.  thanks.  Follow-up by: Crawford Givens MD,  September 08, 2010 2:07 PM  Additional Follow-up for Phone Call Additional follow up Details #1::        Left message on voicemail  to return call.   Lugene Fuquay CMA (AAMA)  September 08, 2010 2:45 PM   Only has 2 left.  Needs new Rx. called in.  Medication phoned to pharmacy. Lugene Fuquay CMA (AAMA)  September 08, 2010 3:13 PM     New/Updated Medications: ATIVAN 2 MG TABS (LORAZEPAM) Take one by mouth  two or three times a day as needed wtih sedation caution. Prescriptions: ATIVAN 2 MG TABS (LORAZEPAM) Take one by mouth  two or three times a day as needed wtih sedation caution.  #30 x 1   Entered by:   Delilah Shan CMA (AAMA)   Authorized by:   Crawford Givens MD   Signed by:   Delilah Shan CMA (AAMA) on 09/08/2010   Method used:   Telephoned to ...       [facility]  [place] [id number] (retail)       [address]       [place], [place]  [address]       Ph: [phone]       Fax: [phone]   RxID:   [id number]

## 2010-09-18 NOTE — Progress Notes (Signed)
Summary: wants to go back to prozac  Phone Note Call from Patient Call back at Home Phone [phone]   Caller: Patient Call For: Dr. Para March Summary of Call: Pt is asking if she can change from pristiq back to prozac.  She says the pristiq is not really helping her and the prozac is less costly.  [facility] [address]. Initial call taken by: Lowella Petties CMA, AAMA,  June 12, 2010 9:15 AM  Follow-up for Phone Call        sent.  Follow-up by: Crawford Givens MD,  June 12, 2010 1:22 PM    New/Updated Medications: PROZAC 20 MG CAPS (FLUOXETINE HCL) 1 by mouth qday.  stop pristiq. Prescriptions: PROZAC 20 MG CAPS (FLUOXETINE HCL) 1 by mouth qday.  stop pristiq.  #90 x 3   Entered and Authorized by:   Crawford Givens MD   Signed by:   Crawford Givens MD on 06/12/2010   Method used:   Electronically to        [facility] [address] [phone].* (retail)       [address]       [place], [place]  [address]       Ph: [phone]       Fax: [phone]   RxID:   [id number]

## 2010-10-07 ENCOUNTER — Ambulatory Visit (INDEPENDENT_AMBULATORY_CARE_PROVIDER_SITE_OTHER): Payer: PRIVATE HEALTH INSURANCE | Admitting: Family Medicine

## 2010-10-07 ENCOUNTER — Encounter: Payer: Self-pay | Admitting: Family Medicine

## 2010-10-07 DIAGNOSIS — F329 Major depressive disorder, single episode, unspecified: Secondary | ICD-10-CM

## 2010-10-14 NOTE — Assessment & Plan Note (Signed)
Summary: DISCUS MEDICATION/CLE   [facility]   Vital Signs:  Patient profile:   55 year old female Height:      63.25 inches Weight:      140.50 pounds BMI:     24.78 Temp:     98.2 degrees F oral Pulse rate:   84 / minute Pulse rhythm:   regular BP sitting:   130 / 66  (left arm) Cuff size:   regular  Vitals Entered By: Delilah Shan CMA Duncan Dull) (October 07, 2010 11:53 AM) CC: Discuss medications   History of Present Illness: Mother died 2010-09-06.  Daughter moved in with patient.  2 recent deaths of friends. Pt's ex-son in law is trying to get custody of her grandkids.  Still trying to work.  Not sleeping, trouble staying asleep. Trouble concentrating.  Fatigue, wants to sleep. No SI/HI. +Hope.  Initially had some help with ativan, less so now.  Some help during the day.  Lack on interest in seeing friends.  Some days are better than others.  In counseling.  Crying at home and work.    Allergies: 1)  ! * Evista 2)  Lipitor 3)  Paxil 4)  * Remeron 5)  Wellbutrin 6)  * Amitriptyline 7)  * Vytorin 8)  * Zegerid 9)  * Crestor  Past History:  Past Medical History: Last updated: 03/31/2010 Depression Hyperlipidemia Hypothyroidism Gyn care per Dr. Huntley Dec Anemia 03/2010  Family History: Reviewed history from 02/28/2010 and no changes required. Father: unknown Mother: HTN, DM, goiter, breast cancer, MI at age 32, died 09/16/2010 Siblings: sister w/ breast ca  Social History: Reviewed history from 02/28/2010 and no changes required. Marital Status: Divorced Children: 2 Occupation  Research officer, political party at [employer] Former Smoker, quit  ~2006, was prev occ smoker From [facility] Occ alcohol   Physical Exam  General:  tearful but appropriate speech, able to regain composure no apparent distress mucous membranes moist regular rate and rhythm   Impression & Recommendations:  Problem # 1:  DEPRESSION (ICD-311) >25 min spent with patient, at least half of which was spent  on counseling ZO:XWRU.  MDD with grief component/adjustment reaction.  No SI/HI. Okay for outpatient follow up.  I would increase the SSRI for now, continue the ativan, and continue counseling.  She will call back is symptoms are progressive.  At this point she doesn't appear to need to be out on FLMA, but this may be a possibility in the future.  She said she would stay in contact as needed.   Her updated medication list for this problem includes:    Ativan 2 Mg Tabs (Lorazepam) .Marland Kitchen... Take one by mouth  two or three times a day as needed wtih sedation caution.    Prozac 20 Mg Caps (Fluoxetine hcl) .Marland Kitchen... 1-2 by mouth qday.  stop pristiq.  Complete Medication List: 1)  Synthroid 112 Mcg Tabs (Levothyroxine sodium) .... One by mouth every day except 1/2 tab on sundays 2)  Ativan 2 Mg Tabs (Lorazepam) .... Take one by mouth  two or three times a day as needed wtih sedation caution. 3)  Prozac 20 Mg Caps (Fluoxetine hcl) .Marland Kitchen.. 1-2 by mouth qday.  stop pristiq. 4)  Calcium Carbonate-vitamin D 600-400 Mg-unit Tabs (Calcium carbonate-vitamin d) .... Two daily 5)  Multivitamins Tabs (Multiple vitamin) .... One daily 6)  Flax Oil (Flaxseed (linseed)) .... Once daily 7)  Simvastatin 20 Mg Tabs (Simvastatin) .... Take 1 each day  Patient Instructions: 1)  Keep  going with counseling and let me know if you are not making progress.  Take 2 prozac a day for now and use the ativan as needed.  Take care.   Prescriptions: ATIVAN 2 MG TABS (LORAZEPAM) Take one by mouth  two or three times a day as needed wtih sedation caution.  #30 x 2   Entered and Authorized by:   Crawford Givens MD   Signed by:   Crawford Givens MD on 10/07/2010   Method used:   Print then Give to Patient   RxID:   [id number] PROZAC 20 MG CAPS (FLUOXETINE HCL) 1-2 by mouth qday.  stop pristiq.  #60 x 12   Entered and Authorized by:   Crawford Givens MD   Signed by:   Crawford Givens MD on 10/07/2010   Method used:   Print then Give to  Patient   RxID:   [id number]    Orders Added: 1)  Est. Patient Level IV [284[address]]    Current Allergies (reviewed today): ! * EVISTA LIPITOR PAXIL * REMERON WELLBUTRIN * AMITRIPTYLINE * VYTORIN * ZEGERID * CRESTOR

## 2010-10-20 ENCOUNTER — Telehealth: Payer: Self-pay | Admitting: Family Medicine

## 2010-10-28 NOTE — Progress Notes (Signed)
Summary: ??when to follow  up  Phone Note Call from Patient Call back at Home Phone [phone]   Caller: Patient Call For: Crawford Givens MD Summary of Call: Patient says that she spoke with you while she was in [place] going through her mothers things and she says that she was so upset that she doesn't remember when you told her to follow up with you. Please advise. Initial call taken by: Melody Comas,  October 20, 2010 10:30 AM  Follow-up for Phone Call        I would like her to either call back or follow up in about 2 weeks.  If she needs to talk between now and then, please let me know.  I appreciate her effort and I did not want to upset her on the phone.  thanks. Crawford Givens MD  October 20, 2010 10:34 AM   Left message on voicemail  in detail.  Personalized VM. Lugene Fuquay CMA (AAMA)  October 20, 2010 11:30 AM

## 2010-10-29 ENCOUNTER — Encounter: Payer: Self-pay | Admitting: Family Medicine

## 2010-10-29 ENCOUNTER — Telehealth: Payer: Self-pay | Admitting: Family Medicine

## 2010-10-30 ENCOUNTER — Telehealth: Payer: Self-pay | Admitting: Family Medicine

## 2010-11-04 ENCOUNTER — Encounter: Payer: Self-pay | Admitting: Family Medicine

## 2010-11-04 ENCOUNTER — Telehealth: Payer: Self-pay | Admitting: *Deleted

## 2010-11-04 NOTE — Telephone Encounter (Signed)
Patient notified.  Appointment made for 11/05/2010.  Patient has medication to last until appt but says it isn't working.  Will provide work note tomorrow at [employer]

## 2010-11-04 NOTE — Progress Notes (Signed)
Summary: wants letter to go back to work  Phone Note Call from Patient Call back at [employer] [phone]   Caller: Patient Summary of Call: Pt is asking if she can get a letter releasing her to go back to work on tuesday.  Please call her when ready and she will pick up, (will be out of town tomorrow through [place]). Initial call taken by: Lowella Petties CMA, AAMA,  October 30, 2010 12:45 PM  Follow-up for Phone Call        see prev note.  thanks. Crawford Givens MD  October 30, 2010 1:58 PM   Mailed 10/29/2010 per previous instructions from Dr. Para March.  Patient Advised.  Follow-up by: Delilah Shan CMA Niva Murren Dull),  October 30, 2010 2:45 PM

## 2010-11-04 NOTE — Progress Notes (Signed)
Summary: wants letter to go back to work  Phone Note Call from Patient Call back at [employer] [phone]   Caller: Patient Summary of Call: Pt has been out of work on [employer] and she would like to go back on 3/20.  She will need letter releasing her.  She is ok, as far as she can tell, to go back.           Lowella Petties CMA, AAMA  October 29, 2010 12:28 PM   Follow-up for Phone Call        see note. Please mail to patient.  Follow-up by: Crawford Givens MD,  October 29, 2010 12:31 PM     Appended Document: wants letter to go back to work Letter mailed 10/29/2010.

## 2010-11-04 NOTE — Letter (Signed)
Summary: Generic Letter  [place] at [facility]  [address] [place], [place] [address]   Phone: [phone]  Fax: [phone]    10/29/2010  Abigail Marks [address] [place], [place]  [address]  [place]  To whom it may concern,   Please allow this patient to go back to work on 11/04/10.  Thank you.    Sincerely,   Crawford Givens MD

## 2010-11-04 NOTE — Telephone Encounter (Signed)
Pt is asking to be seen either today or tomorrow, she was supposed to go back to work today but her anxiety is high at the thought of going back to work.  Please advise on what to do.

## 2010-11-05 ENCOUNTER — Ambulatory Visit (INDEPENDENT_AMBULATORY_CARE_PROVIDER_SITE_OTHER): Payer: PRIVATE HEALTH INSURANCE | Admitting: Family Medicine

## 2010-11-05 ENCOUNTER — Encounter: Payer: Self-pay | Admitting: Family Medicine

## 2010-11-05 VITALS — BP 118/90 | HR 80 | Temp 97.9°F | Ht 63.25 in | Wt 136.0 lb

## 2010-11-05 DIAGNOSIS — F329 Major depressive disorder, single episode, unspecified: Secondary | ICD-10-CM

## 2010-11-05 NOTE — Assessment & Plan Note (Addendum)
Cont current meds and fu in 2 weeks.  Out of work in meantime-extend the [employer].  Continue counseling.  No SI/HI, okay for outpatient fu. >25 min spent with patient, at least half of which was spent on counseling OZ:HYQM. I wouldn't change meds given prev intolerance to paxil, TCA, and wellbutrin.  She agrees with plan. Letter given to patient by MD.

## 2010-11-05 NOTE — Progress Notes (Deleted)
  Subjective:    Patient ID: Abigail Marks, female    DOB: [birth year], 55 y.o.   MRN: [id number]  HPI    Review of Systems     Objective:   Physical Exam        Assessment & Plan:

## 2010-11-05 NOTE — Progress Notes (Signed)
Prev notes reviewed.  Continues to have:  Depressed Mood: yes  Sleep decreased/ Insomnia:  yes Interest decreased in activities:yes Guilt or worthlessness:no Energy decreased:yes Concentration difficulties:yes Appetite disturbance or weight loss: yes Psychomotor retardation/agitation:no Suicidal thoughts:no  Continues with counseling and is on SSRI.  Using prn BZD.  Has had to deal with mult deaths recently and custody issues for grandchildren.   PMH and SH reviewed  ROS: See HPI, otherwise noncontributory.  Meds, vitals, and allergies reviewed.   GEN: nad, alert and oriented, tearful, judgement intact, speech fluent Remainder of exam deferred.

## 2010-11-05 NOTE — Patient Instructions (Signed)
Don't change your meds.  Stay in counseling and come back for an appointment in 2 weeks.  Call me in the meantime if needed.

## 2010-11-10 ENCOUNTER — Other Ambulatory Visit: Payer: Self-pay | Admitting: Family Medicine

## 2010-11-10 NOTE — Telephone Encounter (Signed)
Rx called to pharmacy as instructed. 

## 2010-11-19 ENCOUNTER — Encounter: Payer: Self-pay | Admitting: Family Medicine

## 2010-11-19 ENCOUNTER — Ambulatory Visit (INDEPENDENT_AMBULATORY_CARE_PROVIDER_SITE_OTHER): Payer: PRIVATE HEALTH INSURANCE | Admitting: Family Medicine

## 2010-11-19 DIAGNOSIS — F329 Major depressive disorder, single episode, unspecified: Secondary | ICD-10-CM

## 2010-11-19 NOTE — Assessment & Plan Note (Addendum)
Improved but not resolved.  No SI/HI.  Appears reasonable to try return to work next week.  No change in meds other than trying to taper of lorazepam gradually, 1/2 tab at a time.  She'll call back with update and when she needs a refill.  She agrees with plan.

## 2010-11-19 NOTE — Progress Notes (Signed)
Grief/depression:  Still in counseling.  Asking about dec in meds.  Out of work currently.  She is improving, but not back to baseline.  She wants to go back to to work.  No SI/HI.  Still with insomnia.  Tearful.    Meds, vitals, and allergies reviewed.   ROS: See HPI.  Otherwise, noncontributory.  nad but tearful, though affect is improved compared to prev.  Judgement intact, speech fluent.  No tremor.

## 2010-11-19 NOTE — Patient Instructions (Signed)
Try to gradually cut back on the lorazepam.  Take 1/2 tab in the morning, 1 tab midday and 1 tab at night as needed.  Let me know when you need a refill or if you have other concerns.  Keep up your work with counseling.  Take care.

## 2010-11-20 ENCOUNTER — Encounter: Payer: Self-pay | Admitting: Family Medicine

## 2010-12-09 ENCOUNTER — Other Ambulatory Visit: Payer: Self-pay | Admitting: Family Medicine

## 2010-12-09 NOTE — Telephone Encounter (Signed)
Please call in

## 2010-12-10 NOTE — Telephone Encounter (Signed)
Medication phoned to pharmacy.  

## 2010-12-17 ENCOUNTER — Other Ambulatory Visit: Payer: Self-pay | Admitting: Orthopedic Surgery

## 2010-12-17 DIAGNOSIS — R531 Weakness: Secondary | ICD-10-CM

## 2010-12-17 DIAGNOSIS — M25512 Pain in left shoulder: Secondary | ICD-10-CM

## 2010-12-21 ENCOUNTER — Other Ambulatory Visit: Payer: PRIVATE HEALTH INSURANCE

## 2010-12-26 ENCOUNTER — Ambulatory Visit
Admission: RE | Admit: 2010-12-26 | Discharge: 2010-12-26 | Disposition: A | Discharge status: Home / Self Care | Payer: PRIVATE HEALTH INSURANCE | Source: Ambulatory Visit | Attending: Orthopedic Surgery | Admitting: Orthopedic Surgery

## 2010-12-26 DIAGNOSIS — M25512 Pain in left shoulder: Secondary | ICD-10-CM

## 2010-12-26 DIAGNOSIS — R531 Weakness: Secondary | ICD-10-CM

## 2011-01-02 NOTE — Op Note (Signed)
NAME:  Abigail Marks, Abigail Marks              ACCOUNT NO.:  [id number]   MEDICAL RECORD NO.:  [id number]          PATIENT TYPE:  OBV   LOCATION:  9399                          FACILITY:  [facility]   PHYSICIAN:  Guy Sandifer. Henderson Cloud, M.D. DATE OF BIRTH:  1956-01-22   DATE OF PROCEDURE:  04/01/2005  DATE OF DISCHARGE:                                 OPERATIVE REPORT   PREOPERATIVE DIAGNOSIS:  Stress urinary incontinence.   POSTOPERATIVE DIAGNOSIS:  Stress urinary incontinence.   OPERATION/PROCEDURE:  Mid urethral Lynx sling.   SURGEON:  Guy Sandifer. Henderson Cloud, M.D.   ANESTHESIA:  General endotracheal intubation.   ESTIMATED BLOOD LOSS:  Less than 100 mL.   INDICATIONS AND CONSENT:  This patient is a 55 year old, married white  female, G2, P2, status post hysterectomy and subsequent bilateral salpingo-  oophorectomy with stress urinary incontinence.  Details have been dictated  in history and physical.  Mid urethral sling is discussed preoperatively.  Potential risks and complications have been reviewed preoperatively  including but not limited to infection, organ damage, bleeding requiring  transfusion of blood products and possible transfusion reaction, HIV and  hepatitis acquisition, DVT, PE, pneumonia, fistula formation, erosion,  postoperative dyspareunia, recurrent stress incontinence or inability to  void with prolonged self-catheterization.  All questions have been answered  and consent is signed on the chart.   DESCRIPTION OF PROCEDURE:  The patient was taken to the operating room where  she was identified, placed in the dorsal supine position and general  anesthesia is induced via endotracheal intubation.  She was then placed in  the dorsal lithotomy position, prepped abdominally and vaginally and draped  in a sterile fashion.  Suburethral vaginal mucosa is injected with 0.5%  Marcaine with 1:200,000 epinephrine.  The midline suprapubically is marked  and the incision sites one to two  fingerbreadths lateral to the midline at  superior pubic margin are marked and then injected with 0.5% lidocaine with  epinephrine as well. The vaginal mucosa has been taken down in the midline  under the urethra and dissected sharply and bluntly bilaterally up to the  pubic ramus.  Foley catheter was placed in the bladder.  After making the  suprapubic incisions lateral to the midline bilaterally, the needle supplied  with the [place] system is then placed retropubically bilaterally and exited  laterally to the urethra bilaterally. Then using a cystoscope with a 70-  degree lens, inspection of the bladder is undertaken.  There is no evidence  of bladder perforation, foreign body.  A good ureteral puff is noted  bilaterally.  The cystoscope is then withdrawn and the bladder is drained.  The [place] sling is then placed on the needles bilaterally and the needles are  then withdrawn, placing the sling in place.  The blue tab was cut and the  sheath is removed.  Tensioning is done very carefully with more than one  width of the Kelly clamp providing space between the urethra and the sling.  Excess sling material is cut at the skin level suprapubically.  The vaginal  mucosa is reapproximated with a running locking 2-0 Monocryl  suture.  Cystoscope is then reinserted and a Banano catheter is placed suprapubically  under direct visualization.  The  Banano catheter is sutured in place with 0 permanent suture and 3-0 Monocryl  simple suture was used on each of the bilateral suprapubic incisions to  close them as well.  All counts correct.  The patient is awakened and taken  to the recovery room in stable condition.      Guy Sandifer Henderson Cloud, M.D.  Electronically Signed     JET/MEDQ  D:  04/01/2005  T:  04/01/2005  Job:  [id number]

## 2011-01-02 NOTE — Discharge Summary (Signed)
NAME:  Abigail Marks, Abigail Marks              ACCOUNT NO.:  [id number]   MEDICAL RECORD NO.:  [id number]          PATIENT TYPE:  OBV   LOCATION:  9311                          FACILITY:  [facility]   PHYSICIAN:  Guy Sandifer. Henderson Cloud, M.D. DATE OF BIRTH:  11-Dec-1955   DATE OF ADMISSION:  04/01/2005  DATE OF DISCHARGE:  04/02/2005                                 DISCHARGE SUMMARY   ADMISSION DIAGNOSIS:  Stress urinary incontinence.   DISCHARGE DIAGNOSIS:  Same.   PROCEDURE:  On 04/01/05, mid urethral links sling.   REASON FOR ADMISSION:  This patient is a 55 year old single white female G2  P2 status post hysterectomy and BSO with complaints of incontinence of  urine.  She is admitted for a mid urethral sling.   HOSPITAL COURSE:  The patient is admitted to the hospital and undergoes the  above procedure.  She is observed overnight for pain management.  On the day  of discharge, she is voiding well with 5-10 mL residual urine volumes.  Vital signs are stable.  She is afebrile.  White count 12.7, hemoglobin  10.8.  The suprapubic banana catheter is removed intact.   CONDITION ON DISCHARGE:  Good.   DIET:  Regular as tolerated.   ACTIVITY:  No heavy lifting.  No operation of automobiles.  No vaginal  entry.  She is to call the office problems including but not limited to  temperature of 101 degrees, persistent nausea or vomiting, heavy vaginal  bleeding, or increasing pain.   MEDICATIONS:  1.  Macrobid #10 1 p.o. b.i.d.  2. Percocet 5/325 mg 1-2 p.o. q.6h. p.r.n.      3. Multivitamin daily.   DISCHARGE INSTRUCTIONS:  Double voiding instructions were also given.  Followup is in the office in one to two weeks.      Guy Sandifer Henderson Cloud, M.D.  Electronically Signed     JET/MEDQ  D:  04/02/2005  T:  04/02/2005  Job:  [id number]

## 2011-01-02 NOTE — H&P (Signed)
NAME:  Abigail Marks, Abigail Marks                        ACCOUNT NO.:  [id number]   MEDICAL RECORD NO.:  [id number]                   PATIENT TYPE:  INP   LOCATION:  0362                                 FACILITY:  [facility]   PHYSICIAN:  Sean A. Everardo All, M.D. [facility]           DATE OF BIRTH:  1956-06-29   DATE OF ADMISSION:  03/03/2003  DATE OF DISCHARGE:                                HISTORY & PHYSICAL   REASON FOR ADMISSION:  Fever.   HISTORY OF PRESENT ILLNESS:  The patient is a 55 year old woman with three  days of moderate dyspnea on exertion.  There was associated pain diffusely  throughout the head, chills, and a cough of nonproductive quality.  She also  has some associated numbness and pain of the left arm and diaphoresis upon  minimal exertion.  Finally, she has some palpitations upon minimal exertion.   PAST MEDICAL HISTORY:  1. Hypothyroidism.  2. Insomnia.  3. Dyslipidemia.   MEDICATIONS:  1. Synthroid 125 mcg daily.  2. Ativan 1 mg q.h.s.  3. Crestor 10 mg q.h.s.   SOCIAL HISTORY:  The patient is a Engineer, civil (consulting).  She is married.  Her husband is  with her.   FAMILY HISTORY:  No one else at home is ill.   REVIEW OF SYSTEMS:  Denies the following - vomiting, chest pain, loss of  consciousness, dysuria, sore throat, ear pain, rectal bleeding, hematuria,  abdominal pain, and skin rash.   PHYSICAL EXAMINATION:  VITAL SIGNS:  Blood pressure 144/76, heart rate 114,  respiratory rate 20, temperature 101.1.  GENERAL:  In no distress.  SKIN:  Not diaphoretic.  HEENT:  Head is atraumatic.  Sclerae are anicteric.  Pharynx clear.  Tympanic membranes with no erythema.  NECK:  Supple.  CHEST:  Clear to auscultation.  CARDIOVASCULAR:  No JVD, no edema.  Regular rhythm.  No murmur.  Pedal  pulses are intact.  ABDOMEN:  Soft, nontender.  No hepatosplenomegaly, no mass.  BREAST/GYNECOLOGIC/RECTAL:  Not done at this time due to patient's  condition.  EXTREMITIES:  No obvious deformity of the  joints.  NEUROLOGIC:  Alert, well-oriented.  Cranial nerves appear to be intact.  The  patient moves all fours.   LABORATORY DATA:  A CBC, BMET, CPK, and troponin are all normal.  Electrocardiogram shows sinus tachycardia with a question of left atrial  enlargement.  Chest x-ray with borderline cardiomegaly.    IMPRESSION:  1. Fever of uncertain etiology.  2. Left arm symptoms of uncertain etiology.  3. Other chronic medical problems as noted.  4. In view of her history of thyroid supplementation, TSH should be verified     with her tachycardia.   PLAN:  1. Blood cultures.  2. Antibiotics.  3. Check CPK's.  4. Symptomatic therapy.  5. Check TSH.  6. I discussed code status with the patient.  She states she wants a full     code, but  would not want to be started nor maintained on artificial life     support systems if there was not a reasonable chance of a functional     recovery.                                               Sean A. Everardo All, M.D. [facility]    SAE/MEDQ  D:  03/03/2003  T:  03/03/2003  Job:  [id number]

## 2011-01-02 NOTE — Discharge Summary (Signed)
NAME:  Abigail Marks, Abigail Marks                        ACCOUNT NO.:  [id number]   MEDICAL RECORD NO.:  [id number]                   PATIENT TYPE:  INP   LOCATION:  0362                                 FACILITY:  [facility]   PHYSICIAN:  Rene Paci, M.D. [facility]          DATE OF BIRTH:  [birth year]   DATE OF ADMISSION:  03/03/2003  DATE OF DISCHARGE:  03/05/2003                                 DISCHARGE SUMMARY   DISCHARGE DIAGNOSES:  1. Fever, unclear etiology, improved.  2. Chest pain, left arm tingling, and dyspnea on exertion.  Cardiac enzymes     negative x3.  Telemetry unremarkable.  No evidence of cardiac etiology.  3. History of hypothyroidism with suppressed TSH.  4. History of hyperlipidemia.  5. Insomnia.   DISCHARGE MEDICATIONS:  1. Tequin 400 mg p.o. daily x4 additional days to complete seven day course.  2. The patient is instructed to hold her Synthroid previously dosed 125 mcg     daily for the next two to three weeks until a repeat TSH can be performed     by her primary care physician and dose adjustments made accordingly.  3. Other medications are prior to admission, include Ativan 1 mg p.o. q.h.s.  4. Crestor 10 mg p.o. q.h.s.   HOSPITAL FOLLOWUP:  Scheduled with her primary care physician, Marne A.  Tower, M.D. [facility] for Friday, July 23 at 12 noon to monitor resolution of her  fever and chest symptoms.  The patient is reminded to return to the  emergency room should her fever persist or she develop any new symptoms such  as headache, photophobia, abdominal pain, vomiting, diarrhea, or increasing  chest discomfort.   CONDITION ON DISCHARGE:  Stable and improved.   DISPOSITION:  Home.   HOSPITAL COURSE:  Problem 1 - FEVER WITH CHEST DISCOMFORT:  The patient is a  pleasant 55 year old woman with three days' history of dyspnea on exertion  associated with subjective fever.  She had had mild head pain and a  nonproductive cough with increasing tingling of the left arm.   Because of  the left arm symptoms she sought evaluation in the emergency room on the day  of admission.  Her initial EKG and cardiac enzymes were negative but for  concern of possible anginal equivalent patient was admitted to telemetry for  rule out enzymes and monitoring.  She did have a notable fever of 101.1 on  presentation and was begun empirically on antibiotics, though no infectious  source could be identified.  The patient had a negative chest x-ray, normal  O2 saturations, no GI symptoms, negative UA, and no CNS signs or symptoms to  suggest meningitis or epidural abscess.  The patient's fevers had resolved  within 48 hours and she was changed from IV Rocephin and azithromycin to  oral Tequin which she has tolerated well.  The patient, as stated above, is  reminded to return to the emergency room  should fevers recur while on Tequin  or localization of symptoms occur.  She is to see her primary care physician  later this week to ensure resolution of these symptoms.  Also, as her  cardiac work-up here was negative she is felt to be stable, but likely in  need of an outpatient Cardiolite or stress test at some future date.   Problem 2 - HISTORY OF HYPOTHYROIDISM WITH SUPPRESSED TSH:  The patient's  TSH was found to be 0.005 on admission indicating over treatment.  Synthroid  was held and will be held for the next two to three weeks until a repeat TSH  can be monitored.  She will likely because of her history of hypothyroidism  need to be resumed on some form of replacement, though the dose is to be  determined pending her TSH results.   DISCHARGE LABORATORIES:  TSH 0.005.  CK-MB and troponin I negative x3.  White count 8.0, hemoglobin 12.1, platelet count 225,000.  Normal  differential.  Sodium 147, potassium 4.1, chloride 110, bicarbonate 29, BUN  11, creatinine 0.8, calcium 8.9, glucose 94.                                               Rene Paci, M.D. [facility]    VL/MEDQ   D:  03/05/2003  T:  03/05/2003  Job:  [id number]

## 2011-01-02 NOTE — H&P (Signed)
NAME:  Abigail Marks, Abigail Marks NO.:  [id number]   MEDICAL RECORD NO.:  [id number]           PATIENT TYPE:   LOCATION:                                 FACILITY:   PHYSICIAN:  Guy Sandifer. Henderson Cloud, M.D.      DATE OF BIRTH:   DATE OF ADMISSION:  04/01/2005  DATE OF DISCHARGE:                                HISTORY & PHYSICAL   CHIEF COMPLAINT:  Leaking urine.   HISTORY OF PRESENT ILLNESS:  This patient is a 55 year old married single  white female G2, P2 status post hysterectomy and subsequent BSO who has  leaking of urine with any kind of bearing down or coughing.  Urodynamic  evaluation is consistent with stress urinary incontinence.  Options have  been discussed.  She is being admitted for a mid urethral sling.  Potential  risks and complications have been reviewed preoperatively.   PAST MEDICAL HISTORY:  1.  History of hypothyroidism.  2.  History of cervical dysplasia.  3.  Osteopenia.  4.  Dyslipidemia.  5.  Depression.   PAST SURGICAL HISTORY:  1.  Total vaginal hysterectomy.  2.  Laparoscopic bilateral salpingo-oophorectomy.  3.  Tubal ligation.   FAMILY HISTORY:  Breast cancer in her sister and maternal aunts.  Diabetes  in mother.  Hypertension in mother.  Heart disease mother and sister.  Hypothyroidism in mother.   OBSTETRICAL HISTORY:  Vaginal delivery x2.   MEDICATIONS:  1.  Wellbutrin 100 mg b.i.d.  2.  Vytorin 10/20 half pill daily.  3.  Evista 60 mg daily.  4.  Synthroid 0.125 mg daily.   No known drug allergies.   SOCIAL HISTORY:  Drinks alcohol social basis.  Denies tobacco or drug abuse.   REVIEW OF SYSTEMS:  NEUROLOGIC:  Denies headache.  CARDIAC:  Denies chest  pain.  PULMONARY:  Denies shortness of breath.  GASTROINTESTINAL:  Denies  recent changes in bowel habits.   PHYSICAL EXAMINATION:  VITAL SIGNS:  Height 5 feet 3-1/2 inches, weight 150  pounds, blood pressure 120/80.  HEENT:  Without thyromegaly.  LUNGS:  Clear to  auscultation.  HEART:  Regular rate and rhythm.  BACK:  Without CVA tenderness.  BREASTS:  Without mass, retraction, or discharge.  ABDOMEN:  Soft, nontender without masses.  PELVIC:  Vulva, vagina without lesion.  Adnexa nontender without masses.  EXTREMITIES:  Grossly within normal limits.  NEUROLOGIC:  Grossly within normal limits.   ASSESSMENT:  Stress urinary incontinence.   PLAN:  Mid urethral sling.      Guy Sandifer Henderson Cloud, M.D.  Electronically Signed     JET/MEDQ  D:  03/30/2005  T:  03/30/2005  Job:  [id number]

## 2011-01-05 ENCOUNTER — Encounter: Payer: Self-pay | Admitting: Family Medicine

## 2011-01-05 ENCOUNTER — Ambulatory Visit (INDEPENDENT_AMBULATORY_CARE_PROVIDER_SITE_OTHER): Payer: PRIVATE HEALTH INSURANCE | Admitting: Family Medicine

## 2011-01-05 DIAGNOSIS — R05 Cough: Secondary | ICD-10-CM

## 2011-01-05 DIAGNOSIS — E039 Hypothyroidism, unspecified: Secondary | ICD-10-CM

## 2011-01-05 MED ORDER — AZITHROMYCIN 250 MG PO TABS: 250.0000 mg | ORAL_TABLET | Freq: Every day | ORAL | Status: AC

## 2011-01-05 MED ORDER — LEVOTHYROXINE SODIUM 112 MCG PO TABS: ORAL_TABLET | ORAL | Status: DC

## 2011-01-05 MED ORDER — HYDROCODONE-HOMATROPINE 5-1.5 MG/5ML PO SYRP: 5.0000 mL | ORAL_SOLUTION | Freq: Four times a day (QID) | ORAL | Status: AC | PRN

## 2011-01-05 NOTE — Assessment & Plan Note (Signed)
Start abx for presumed bronchitis.  Okay for outpatient fu.  Nontoxic.  D/w pt options about cough tx, she declined SABA due to potential side effects and this is reasonable.  Sedation caution on cough syrup.  She understood. Fu prn.

## 2011-01-05 NOTE — Progress Notes (Signed)
duration of symptoms:2 weeks, chest symptoms not relenting.   Rhinorrhea: no Sweat and chills: yes congestion:no ear pain:no sore throat:no cough:yes myalgias:yes other concerns: initially with NAV then progressed to cough, no help with mucinex.  Some sputum this AM, green.  No sick contacts.    ROS: See HPI.  Otherwise negative.    Meds, vitals, and allergies reviewed.   nad initially but then tearful, then regains composures ncat Tm wnl  Nasal and oral exam wnl Neck supple rrr Coarse BS but o/w lung exam wnl.  Ext well perfused.

## 2011-01-05 NOTE — Patient Instructions (Signed)
Drink plenty of fluids, start the zithromax today and use the cough medicine as needed.  It can make you drowsy.  This should gradually improve.  Take care.  Let us know if you have other concerns.

## 2011-01-14 ENCOUNTER — Telehealth: Payer: Self-pay | Admitting: *Deleted

## 2011-01-14 MED ORDER — LORAZEPAM 2 MG PO TABS: ORAL_TABLET | ORAL | Status: DC

## 2011-01-14 NOTE — Telephone Encounter (Signed)
Please call in rx.  If patient is continuing to have troubles, please have her come in so we can talk.  Thanks.
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
## 2011-01-14 NOTE — Telephone Encounter (Signed)
I called the patient and she is at home, awaiting word about her coworkers.  She has family at home with her. She was prev doing well, tapered off lorazepam.  She'll use it again in the meantime and call back prn.  She agrees with the plan.

## 2011-01-14 NOTE — Telephone Encounter (Signed)
Called in by MD>

## 2011-02-02 ENCOUNTER — Ambulatory Visit: Payer: PRIVATE HEALTH INSURANCE | Admitting: Family Medicine

## 2011-02-06 ENCOUNTER — Other Ambulatory Visit: Payer: Self-pay | Admitting: Family Medicine

## 2011-02-06 NOTE — Telephone Encounter (Signed)
Rx called to pharmacy

## 2011-02-06 NOTE — Telephone Encounter (Signed)
Please call in.  Thanks.   

## 2011-03-05 ENCOUNTER — Other Ambulatory Visit: Payer: Self-pay | Admitting: Family Medicine

## 2011-03-05 DIAGNOSIS — E039 Hypothyroidism, unspecified: Secondary | ICD-10-CM

## 2011-03-05 DIAGNOSIS — D649 Anemia, unspecified: Secondary | ICD-10-CM

## 2011-03-05 DIAGNOSIS — E7800 Pure hypercholesterolemia, unspecified: Secondary | ICD-10-CM

## 2011-03-05 NOTE — Telephone Encounter (Signed)
Is it okay to refill this medication? When is patient due for lab work? Please verify dispense amount and number of refills.

## 2011-03-05 NOTE — Telephone Encounter (Signed)
rx sent.  Have the 9/12 OV changed to a CPE and then get lab visit ahead of time.  Thanks.

## 2011-03-06 ENCOUNTER — Other Ambulatory Visit: Payer: Self-pay | Admitting: *Deleted

## 2011-03-06 MED ORDER — SIMVASTATIN 20 MG PO TABS: ORAL_TABLET | ORAL | Status: DC

## 2011-03-09 NOTE — Telephone Encounter (Signed)
Please send her a certified letter about calling into schedule lab appointment before the CPE.  Thanks.

## 2011-03-09 NOTE — Telephone Encounter (Signed)
Unable to reach patient by telephone to schedule lab appointment because telephone numbers in the chart are incorrect.  Appointment scheduled on 05/08/11  changed to a CPE 30 minute appointment.

## 2011-03-10 ENCOUNTER — Encounter: Payer: Self-pay | Admitting: *Deleted

## 2011-03-10 ENCOUNTER — Telehealth: Payer: Self-pay | Admitting: *Deleted

## 2011-03-10 NOTE — Telephone Encounter (Signed)
Patient says that she has had a migraine for 3 days. She has taken tylenol, Excedrin migraine, and Ibuprofen and nothing is helping.  She is asking if there is anything that you could recommend that she could try.

## 2011-03-10 NOTE — Telephone Encounter (Signed)
I called pt.  She had a migraine over the weekend.  No nausea.  She is some better now.  She has had migraine prev, but it was 20+ years ago.  We talked about options.  She can't take benadryl for sleep.  She usually takes ativan at night. I would use that, try to get some sleep, and avoid triggers- esp since she is some better.  She agrees.

## 2011-03-10 NOTE — Telephone Encounter (Signed)
Letter mailed to patient - as instructed.

## 2011-03-10 NOTE — Telephone Encounter (Signed)
I called pt at work number and then at [phone].  I couldn't get through but I did LMOVM.  I'll try to call her again later.

## 2011-03-16 ENCOUNTER — Other Ambulatory Visit: Payer: Self-pay | Admitting: Family Medicine

## 2011-03-16 NOTE — Telephone Encounter (Signed)
Please call in

## 2011-03-17 ENCOUNTER — Other Ambulatory Visit: Payer: Self-pay | Admitting: *Deleted

## 2011-03-17 NOTE — Telephone Encounter (Signed)
rx called into pharmacy

## 2011-03-25 ENCOUNTER — Encounter: Payer: Self-pay | Admitting: Family Medicine

## 2011-03-25 ENCOUNTER — Ambulatory Visit (INDEPENDENT_AMBULATORY_CARE_PROVIDER_SITE_OTHER): Payer: PRIVATE HEALTH INSURANCE | Admitting: Family Medicine

## 2011-03-25 VITALS — BP 124/64 | HR 56 | Temp 98.8°F | Wt 139.2 lb

## 2011-03-25 DIAGNOSIS — R259 Unspecified abnormal involuntary movements: Secondary | ICD-10-CM

## 2011-03-25 DIAGNOSIS — R251 Tremor, unspecified: Secondary | ICD-10-CM

## 2011-03-25 MED ORDER — LEVOTHYROXINE SODIUM 112 MCG PO TABS: ORAL_TABLET | ORAL | Status: DC

## 2011-03-25 NOTE — Patient Instructions (Signed)
We'll contact you with your lab report. Take care.  Glad to see you.  

## 2011-03-25 NOTE — Progress Notes (Signed)
Mood- "I'm doing okay."  She's doing okay at work.  Still taking ativan at night and it helps her sleep.  Mood is improved.    She noted hand tremors.  This has happened before when her thyroid medicine needed adjustment.  It's not an intention tremor.  Only in hands.  L>R hand sx.  She noted it going on for about 3 weeks.  She's noted a change in concentration and she just doesn't feel like her recall is as sharp as it normally is.  Feeling well o/w.   Meds, vitals, and allergies reviewed.   ROS: See HPI.  Otherwise, noncontributory.  GEN: nad, alert and oriented HEENT: mucous membranes moist NECK: supple w/o LA CV: rrr PULM: ctab, no inc wob ABD: soft, +bs EXT: no edema SKIN: no acute rash CN 2-12 wnl B, S/S/DTR wnl x4 Minimal tremor in hands noted.

## 2011-03-25 NOTE — Assessment & Plan Note (Signed)
Check TSH and adjust meds as needed.  Will investigate further if TSH wnl.  She agrees.

## 2011-03-26 LAB — TSH: TSH: 0.464 u[IU]/mL (ref 0.350–4.500)

## 2011-03-26 LAB — T4, FREE: Free T4: 1.59 ng/dL (ref 0.80–1.80)

## 2011-04-21 ENCOUNTER — Other Ambulatory Visit: Payer: Self-pay | Admitting: Family Medicine

## 2011-04-22 NOTE — Telephone Encounter (Signed)
Medication phoned to pharmacy.  

## 2011-04-22 NOTE — Telephone Encounter (Signed)
Please call in

## 2011-05-04 ENCOUNTER — Other Ambulatory Visit: Payer: PRIVATE HEALTH INSURANCE

## 2011-05-05 ENCOUNTER — Other Ambulatory Visit: Payer: PRIVATE HEALTH INSURANCE

## 2011-05-07 ENCOUNTER — Other Ambulatory Visit (INDEPENDENT_AMBULATORY_CARE_PROVIDER_SITE_OTHER): Payer: PRIVATE HEALTH INSURANCE

## 2011-05-07 DIAGNOSIS — E7800 Pure hypercholesterolemia, unspecified: Secondary | ICD-10-CM

## 2011-05-07 DIAGNOSIS — D649 Anemia, unspecified: Secondary | ICD-10-CM

## 2011-05-07 LAB — COMPREHENSIVE METABOLIC PANEL
ALT: 17 U/L (ref 0–35)
CO2: 31 mEq/L (ref 19–32)
Chloride: 103 mEq/L (ref 96–112)
GFR: 80.99 mL/min (ref >60.00)
Sodium: 141 mEq/L (ref 135–145)
Total Bilirubin: 0.5 mg/dL (ref 0.3–1.2)
Total Protein: 6.9 g/dL (ref 6.0–8.3)

## 2011-05-07 LAB — CBC WITH DIFFERENTIAL/PLATELET
Basophils Absolute: 0 10*3/uL (ref 0.0–0.1)
Lymphocytes Relative: 36.8 % (ref 12.0–46.0)
Lymphs Abs: 2.1 10*3/uL (ref 0.7–4.0)
Monocytes Relative: 9.6 % (ref 3.0–12.0)
Platelets: 274 10*3/uL (ref 150.0–400.0)
RDW: 12.7 % (ref 11.5–14.6)

## 2011-05-07 LAB — LDL CHOLESTEROL, DIRECT: Direct LDL: 134.9 mg/dL

## 2011-05-07 LAB — LIPID PANEL: VLDL: 18.6 mg/dL (ref 0.0–40.0)

## 2011-05-08 ENCOUNTER — Ambulatory Visit: Payer: PRIVATE HEALTH INSURANCE | Admitting: Family Medicine

## 2011-05-08 ENCOUNTER — Encounter: Payer: PRIVATE HEALTH INSURANCE | Admitting: Family Medicine

## 2011-05-26 ENCOUNTER — Ambulatory Visit (INDEPENDENT_AMBULATORY_CARE_PROVIDER_SITE_OTHER): Payer: PRIVATE HEALTH INSURANCE | Admitting: Family Medicine

## 2011-05-26 ENCOUNTER — Encounter: Payer: Self-pay | Admitting: Family Medicine

## 2011-05-26 DIAGNOSIS — R51 Headache: Secondary | ICD-10-CM

## 2011-05-26 DIAGNOSIS — G4700 Insomnia, unspecified: Secondary | ICD-10-CM

## 2011-05-26 MED ORDER — TEMAZEPAM 7.5 MG PO CAPS: 7.5000 mg | ORAL_CAPSULE | Freq: Every evening | ORAL | Status: DC | PRN

## 2011-05-26 NOTE — Assessment & Plan Note (Signed)
Change to temazepam and continue sleep hygiene.  She'll call back with update.  She may have a nocturnal limb movement contribution and we can consider working this up if sx continue.  She agrees to call back as needed.

## 2011-05-26 NOTE — Assessment & Plan Note (Signed)
With episodic sx related to weather change, then sinus sx are a likely cause.  Use nasal saline and fu prn.  She agrees.

## 2011-05-26 NOTE — Patient Instructions (Signed)
Try the temazepam at night; stop the ativan.  See if that helps with sleep.   Use nasal saline and see if that helps with the headaches.  Take care.

## 2011-05-26 NOTE — Progress Notes (Signed)
Inc in HA frequency (frontal, inc with weather change, during the day) recently and fatigued.    Insomnia- trouble getting to sleep and staying asleep.  She's worked on Physiological scientist.  Ends up tossing and turning.  Busy with work.  Mood is okay, but she's just tired.  Ativan isn't helping with sleep.  No help with otc sleep meds.  Intolerant of trazodone and ambien.    Meds, vitals, and allergies reviewed.   ROS: See HPI.  Otherwise, noncontributory.  nad ncat Frontal sinus ttp x2 (mildly) but TM wnl, nasal and OP exam wnl.  Neck supple rrr Affect wnl, speech and judgement wnl

## 2011-06-08 ENCOUNTER — Telehealth: Payer: Self-pay | Admitting: *Deleted

## 2011-06-08 ENCOUNTER — Encounter: Payer: Self-pay | Admitting: Family Medicine

## 2011-06-08 MED ORDER — ESZOPICLONE 2 MG PO TABS: 2.0000 mg | ORAL_TABLET | Freq: Every evening | ORAL | Status: DC | PRN

## 2011-06-08 NOTE — Telephone Encounter (Signed)
Stop temazepam.  Med list updated.  She didn't tolerate ambien or trazodone.  She can try lunesta.  Please call in and have her call back with update.  Thanks.

## 2011-06-08 NOTE — Telephone Encounter (Signed)
Patient advised. Medication phoned to pharmacy.  

## 2011-06-08 NOTE — Telephone Encounter (Signed)
Pt states she was given temazepam to help her sleep but this isnt helping her at all.  She says she is having terrible nightmares.  [facility] [address], in case you want to change her to something else.

## 2011-06-09 ENCOUNTER — Telehealth: Payer: Self-pay | Admitting: *Deleted

## 2011-06-09 NOTE — Telephone Encounter (Signed)
Prior auth needed for [employer], approval given over the phone.  Approval letter placed on doctor's desk for signature and scanning.  Pharmacy has been advised.

## 2011-06-09 NOTE — Telephone Encounter (Signed)
Noted  

## 2011-10-12 ENCOUNTER — Other Ambulatory Visit: Payer: Self-pay | Admitting: Family Medicine

## 2011-10-12 NOTE — Telephone Encounter (Signed)
Patient called to report symptoms of N/V/D.  She has no complaints of a fever but she did have a nose bleed this morning but it has stopped and hasn't started back.  Patient is requesting a Rx for nausea medication.  [facility] [address]

## 2011-10-12 NOTE — Telephone Encounter (Signed)
Received refill request electronically from pharmacy. Is it okay to refill medication? 

## 2011-10-13 MED ORDER — ONDANSETRON HCL 4 MG PO TABS: 4.0000 mg | ORAL_TABLET | Freq: Three times a day (TID) | ORAL | Status: AC | PRN

## 2011-10-13 NOTE — Telephone Encounter (Signed)
Medication phoned to pharmacy.  Pt advised. 

## 2011-10-13 NOTE — Telephone Encounter (Signed)
Please call in lunesta.  zofran sent, have pt f/u if NVD continue after using the med.  Thanks.

## 2011-10-14 NOTE — Progress Notes (Signed)
Addended by: Liane Comber C on: 10/14/2011 11:01 AM   Modules accepted: Orders

## 2011-10-22 ENCOUNTER — Ambulatory Visit (INDEPENDENT_AMBULATORY_CARE_PROVIDER_SITE_OTHER): Payer: PRIVATE HEALTH INSURANCE | Admitting: Family Medicine

## 2011-10-22 ENCOUNTER — Encounter: Payer: Self-pay | Admitting: Family Medicine

## 2011-10-22 DIAGNOSIS — F329 Major depressive disorder, single episode, unspecified: Secondary | ICD-10-CM

## 2011-10-22 MED ORDER — DESVENLAFAXINE SUCCINATE ER 50 MG PO TB24: 50.0000 mg | ORAL_TABLET | Freq: Every day | ORAL | Status: DC

## 2011-10-22 MED ORDER — FLUOXETINE HCL 10 MG PO CAPS: 10.0000 mg | ORAL_CAPSULE | Freq: Every day | ORAL | Status: DC

## 2011-10-22 NOTE — Progress Notes (Signed)
Depressed Mood: yes Insomnia is some better, using less lunesta.  Interest decreased in activities: yes Guilt or worthlessness: yes Energy decreased:yes Concentration difficulties: yes Weight increased recently.  Psychomotor retardation/agitation:no Suicidal thoughts:no   Mood is worse over last few months.    Busy with work.  She's trying to work through family situation, it's been a year since her mother died.  She's still going up to [place] to see her step father.  She was in counseling before, but isn't now.  It helped at the time.  She is occ anxious.    She thought the prozac helped some prev, more with the anxiety than depression.  She is open to return to counseling.  Pristiq had helped more prev.  She'd like to try this again.    PMH and SH reviewed  Meds, vitals, and allergies reviewed.   ROS: See HPI.  Otherwise negative.    NAD, tearful but regains composure.  Speech fluent Judgement intact Affect flat NCAT RRR CTAB No tremor

## 2011-10-22 NOTE — Patient Instructions (Signed)
Take 10mg  of prozac for 10 days, no meds for 4 days, then start pristiq.  Call me with an update at the end of the month, sooner if needed.  Take care.

## 2011-10-23 ENCOUNTER — Encounter: Payer: Self-pay | Admitting: Family Medicine

## 2011-10-23 NOTE — Assessment & Plan Note (Signed)
Taper prozac, change to pristiq and then call back as needed.  She agrees.  No SI/HI.  Okay for outpatient f/u.  We discussed f/u with counseling.  She agrees with plan.

## 2011-12-14 ENCOUNTER — Other Ambulatory Visit: Payer: Self-pay | Admitting: *Deleted

## 2011-12-14 MED ORDER — SIMVASTATIN 20 MG PO TABS: ORAL_TABLET | ORAL | Status: DC

## 2011-12-30 ENCOUNTER — Telehealth: Payer: Self-pay | Admitting: Family Medicine

## 2011-12-30 NOTE — Telephone Encounter (Signed)
Will see tomorrow

## 2011-12-30 NOTE — Telephone Encounter (Signed)
Caller: Hiroko/Patient; PCP: Crawford Givens Clelia Croft); CB#: [phone];  Call regarding B/P 172/104 L arm at 1040;  BP  160/90 on  12/29/11. Notes intermittent nosebleeds and daily headache for past 1.5 weeks.  Mild weakness, wooziness 12/30/11. Mild nausea present.  No hx of HTN.  LMP/hysterectomy.  Stared on [employer] in past 2 months.  Notes 10 lb wt gain over past 3-4 months.  Advised to see MD withNo appt remain for 12/30/11 with any MD.  Appt scheduled for 12/31/11 at 1130 with Dr Para March.

## 2011-12-31 ENCOUNTER — Encounter: Payer: Self-pay | Admitting: Family Medicine

## 2011-12-31 ENCOUNTER — Ambulatory Visit (INDEPENDENT_AMBULATORY_CARE_PROVIDER_SITE_OTHER): Payer: PRIVATE HEALTH INSURANCE | Admitting: Family Medicine

## 2011-12-31 VITALS — BP 142/84 | HR 90 | Temp 98.5°F | Wt 142.0 lb

## 2011-12-31 DIAGNOSIS — R030 Elevated blood-pressure reading, without diagnosis of hypertension: Secondary | ICD-10-CM

## 2011-12-31 DIAGNOSIS — R51 Headache: Secondary | ICD-10-CM

## 2011-12-31 DIAGNOSIS — F329 Major depressive disorder, single episode, unspecified: Secondary | ICD-10-CM

## 2011-12-31 NOTE — Progress Notes (Signed)
Elevated BPs.  160-170s/90-100s on outside checks, all checks were done at work.  BP is lower today.  She is still working at hospice and it isn't easy work.  Not on BP meds.    She's had some HAs recently, per patient thought to be due to allergies and/or weather changes.  Pain is R frontal. She had vision changes in B eye, having to work harder to focus.  H/o migraines in past, usually rarely.  Was nauseated with the HA.  Phonophobia with the HA.  Taking tylenol or ibuprofen for HA.  Had an isolated nose bleed last week.  Overall, she doesn't feel well- I asked for clarification.  She feels 'foggy' and this happened since starting pristiq. Pristiq helped with her mood prev, but she isn't clear if has had any effect recently.  She was switched to that in 4/13.    PMH and SH reviewed  ROS: See HPI, otherwise noncontributory.  Meds, vitals, and allergies reviewed.   nad ncat Tm wnl Nasal and OP exam wnl Neck supple No LA rrr ctab CN 2-12 wnl B, S/S/DTR wnl x4

## 2011-12-31 NOTE — Patient Instructions (Addendum)
Stop the pristiq for now. Call back next week with an update on the 'foggy' feeling and the headaches.  If you have allergy symptoms, then take plain claritin 10mg  a day.  Start back gently with exercise.

## 2012-01-01 DIAGNOSIS — R030 Elevated blood-pressure reading, without diagnosis of hypertension: Secondary | ICD-10-CM | POA: Insufficient documentation

## 2012-01-01 NOTE — Assessment & Plan Note (Signed)
Unclear if this was work stress related.  Will follow.

## 2012-01-01 NOTE — Assessment & Plan Note (Signed)
Will make one change at a time, will stop pristiq for now.  See remainder of plan.  Mood okay, No SI/Hi.  If she continues to feel 'foggy' would need further f/u and consider stopping simva.  She understood.

## 2012-01-01 NOTE — Assessment & Plan Note (Signed)
Likely migraine, possible triggered by seasonal allergies thought with unremarkable HEENT exam today.  Will follow clinically.  Will make one change at a time, will stop pristiq for now.  See remainder of plan.

## 2012-01-12 ENCOUNTER — Other Ambulatory Visit: Payer: Self-pay | Admitting: Family Medicine

## 2012-01-12 NOTE — Telephone Encounter (Signed)
Electronic refill request

## 2012-01-12 NOTE — Telephone Encounter (Signed)
Prev stopped.

## 2012-01-15 ENCOUNTER — Telehealth: Payer: Self-pay | Admitting: *Deleted

## 2012-01-15 MED ORDER — FLUOXETINE HCL (PMDD) 10 MG PO TABS: 10.0000 mg | ORAL_TABLET | Freq: Every day | ORAL | Status: DC

## 2012-01-15 NOTE — Telephone Encounter (Signed)
Patient calling to let you know that her BP did well for the last couple of weeks.  She wants to see if you would put her back on the Prozac 10 or 20 mg, (whichever you think would be best) since you took her off the Pristiq.  [facility] on [address], [place].

## 2012-01-15 NOTE — Telephone Encounter (Signed)
I would start back on prozac 10mg  a day.  If that isn't helping her mood, then notify us.  Thanks.  rx sent.

## 2012-01-15 NOTE — Telephone Encounter (Signed)
Patient advised.

## 2012-01-18 ENCOUNTER — Other Ambulatory Visit: Payer: Self-pay | Admitting: Family Medicine

## 2012-06-01 ENCOUNTER — Other Ambulatory Visit: Payer: Self-pay | Admitting: Family Medicine

## 2012-06-01 NOTE — Telephone Encounter (Signed)
Ok to refill 

## 2012-06-02 NOTE — Telephone Encounter (Signed)
Please call in

## 2012-06-02 NOTE — Telephone Encounter (Signed)
Medication phoned to pharmacy.  

## 2012-06-28 ENCOUNTER — Other Ambulatory Visit: Payer: Self-pay | Admitting: Family Medicine

## 2012-07-22 ENCOUNTER — Other Ambulatory Visit: Payer: Self-pay | Admitting: Family Medicine

## 2012-07-22 NOTE — Telephone Encounter (Signed)
Electronic refill request.  No appt in last 6 months sent to provider according to refill protocol.   Please advise.

## 2012-07-24 NOTE — Telephone Encounter (Signed)
Sent. Thanks.   

## 2012-10-04 ENCOUNTER — Other Ambulatory Visit: Payer: Self-pay | Admitting: Family Medicine

## 2012-10-04 NOTE — Telephone Encounter (Signed)
Electronic refill request.  Please advise. 

## 2012-10-05 NOTE — Telephone Encounter (Signed)
Please call in.  Needs CPE this spring.  Thanks.

## 2012-10-06 NOTE — Telephone Encounter (Signed)
Patient advised.  Rx phoned to pharmacy. 

## 2012-11-07 ENCOUNTER — Telehealth: Payer: Self-pay | Admitting: *Deleted

## 2012-11-07 NOTE — Telephone Encounter (Signed)
Abigail Marks took a call from the patient stating that her BP is 180/80 and she is having some slight chest tightness.  Wants appt today.  No appts available.  Please advise.

## 2012-11-07 NOTE — Telephone Encounter (Signed)
Patient advised.

## 2012-11-07 NOTE — Telephone Encounter (Signed)
To ER due to chest sx.

## 2012-12-09 ENCOUNTER — Other Ambulatory Visit: Payer: Self-pay | Admitting: Family Medicine

## 2012-12-09 DIAGNOSIS — E7800 Pure hypercholesterolemia, unspecified: Secondary | ICD-10-CM

## 2012-12-09 DIAGNOSIS — E039 Hypothyroidism, unspecified: Secondary | ICD-10-CM

## 2012-12-16 ENCOUNTER — Other Ambulatory Visit (INDEPENDENT_AMBULATORY_CARE_PROVIDER_SITE_OTHER): Payer: PRIVATE HEALTH INSURANCE

## 2012-12-16 DIAGNOSIS — E7800 Pure hypercholesterolemia, unspecified: Secondary | ICD-10-CM

## 2012-12-16 DIAGNOSIS — E039 Hypothyroidism, unspecified: Secondary | ICD-10-CM

## 2012-12-16 LAB — TSH: TSH: 2.88 u[IU]/mL (ref 0.35–5.50)

## 2012-12-19 LAB — LIPID PANEL
Cholesterol: 209 mg/dL — ABNORMAL HIGH (ref 0–200)
HDL: 67.5 mg/dL (ref >39.00)
Total CHOL/HDL Ratio: 3
Triglycerides: 88 mg/dL (ref 0.0–149.0)
VLDL: 17.6 mg/dL (ref 0.0–40.0)

## 2012-12-19 LAB — COMPREHENSIVE METABOLIC PANEL
AST: 31 U/L (ref 0–37)
Alkaline Phosphatase: 71 U/L (ref 39–117)
BUN: 20 mg/dL (ref 6–23)
Glucose, Bld: 96 mg/dL (ref 70–99)
Potassium: 3.8 mEq/L (ref 3.5–5.1)
Total Bilirubin: 0.5 mg/dL (ref 0.3–1.2)

## 2012-12-20 ENCOUNTER — Other Ambulatory Visit: Payer: Self-pay | Admitting: Family Medicine

## 2012-12-23 ENCOUNTER — Encounter: Payer: Self-pay | Admitting: Family Medicine

## 2012-12-23 ENCOUNTER — Ambulatory Visit (INDEPENDENT_AMBULATORY_CARE_PROVIDER_SITE_OTHER): Payer: PRIVATE HEALTH INSURANCE | Admitting: Family Medicine

## 2012-12-23 VITALS — BP 132/80 | HR 71 | Temp 98.1°F | Ht 63.5 in | Wt 135.0 lb

## 2012-12-23 DIAGNOSIS — E785 Hyperlipidemia, unspecified: Secondary | ICD-10-CM

## 2012-12-23 DIAGNOSIS — F3289 Other specified depressive episodes: Secondary | ICD-10-CM

## 2012-12-23 DIAGNOSIS — F329 Major depressive disorder, single episode, unspecified: Secondary | ICD-10-CM

## 2012-12-23 DIAGNOSIS — K589 Irritable bowel syndrome without diarrhea: Secondary | ICD-10-CM

## 2012-12-23 DIAGNOSIS — Z0000 Encounter for general adult medical examination without abnormal findings: Secondary | ICD-10-CM

## 2012-12-23 DIAGNOSIS — E039 Hypothyroidism, unspecified: Secondary | ICD-10-CM

## 2012-12-23 DIAGNOSIS — Z23 Encounter for immunization: Secondary | ICD-10-CM

## 2012-12-23 MED ORDER — SIMVASTATIN 20 MG PO TABS: 20.0000 mg | ORAL_TABLET | ORAL | Status: DC

## 2012-12-23 MED ORDER — DICYCLOMINE HCL 20 MG PO TABS: 20.0000 mg | ORAL_TABLET | Freq: Four times a day (QID) | ORAL | Status: DC

## 2012-12-23 NOTE — Progress Notes (Signed)
CPE- See plan.  Routine anticipatory guidance given to patient.  See health maintenance. Pelvic exam, breast, mammogram, and stool cards all through Dr. Phineas [facility] clinic. GYN clinic.  All up to date per patient.  Tetanus shot 2014.   Flu shot encouraged, done at work.  Diet and exercise d/w pt.  Encouraged more exercise.   Living will d/w pt.  Abigail Marks designated if incapacitated.  She can drop off a copy here and we can scan it.    Depression: discussed. Mood is stable. Still with some insomnia, helped with meds.  No SI/HI.  Busy with work.  Focus and concentration is fair.  Not tearful.    Hypothyroid.  No neck mass, dysphagia.  Compliant with meds.  Labs discussed.   Elevated Cholesterol: Using medications without problems:yes Muscle aches: some, better if taken QOD Diet compliance:partial, discussed Exercise:discussed  She'll have BM 30 min after eating.  This isn't new. It tends to be a loose BM with gas and bloating. No help with OTC meds/fiber.  No blood in stool. Some cramping. Never on rx meds for this.  Known dx of IBS.    PMH and SH reviewed  Meds, vitals, and allergies reviewed.   ROS: See HPI.  Otherwise negative.    GEN: nad, alert and oriented HEENT: mucous membranes moist NECK: supple w/o LA CV: rrr. PULM: ctab, no inc wob ABD: soft, +bs EXT: no edema SKIN: no acute rash

## 2012-12-23 NOTE — Patient Instructions (Addendum)
Try the bentyl for now and let me know if that doesn't help.   Take care. Glad to see you. I would get a flu shot each fall.

## 2012-12-25 DIAGNOSIS — Z0000 Encounter for general adult medical examination without abnormal findings: Secondary | ICD-10-CM | POA: Insufficient documentation

## 2012-12-25 NOTE — Assessment & Plan Note (Signed)
Reasonable control with high HDL, continue as is.  She agrees.

## 2012-12-25 NOTE — Assessment & Plan Note (Signed)
Routine anticipatory guidance given to patient.  See health maintenance. Pelvic exam, breast, mammogram, and stool cards all through Dr. Phineas Real clinic. GYN clinic.  All up to date per patient.  Tetanus shot 2014.   Flu shot encouraged, done at work.  Diet and exercise d/w pt.  Encouraged more exercise.   Living will d/w pt.  Vanessa Sweger designated if incapacitated.  She can drop off a copy here and we can scan it.

## 2012-12-25 NOTE — Assessment & Plan Note (Signed)
Add on bentyl and she'll notify us if not improved.  Longstanding and no red flag sx o/w by history.  Benign abd exam today.

## 2012-12-25 NOTE — Assessment & Plan Note (Signed)
Controlled, continue as is. She agrees.  

## 2012-12-25 NOTE — Assessment & Plan Note (Signed)
tsh wnl, continue as is.  She agrees.

## 2013-02-06 ENCOUNTER — Other Ambulatory Visit: Payer: Self-pay | Admitting: Family Medicine

## 2013-03-02 ENCOUNTER — Other Ambulatory Visit: Payer: Self-pay | Admitting: Obstetrics and Gynecology

## 2013-05-23 ENCOUNTER — Other Ambulatory Visit: Payer: Self-pay | Admitting: Family Medicine

## 2013-08-07 ENCOUNTER — Ambulatory Visit (INDEPENDENT_AMBULATORY_CARE_PROVIDER_SITE_OTHER): Payer: PRIVATE HEALTH INSURANCE | Admitting: Family Medicine

## 2013-08-07 ENCOUNTER — Encounter: Payer: Self-pay | Admitting: Family Medicine

## 2013-08-07 VITALS — BP 122/84 | HR 92 | Temp 98.5°F | Wt 143.5 lb

## 2013-08-07 DIAGNOSIS — R05 Cough: Secondary | ICD-10-CM

## 2013-08-07 MED ORDER — HYDROCODONE-HOMATROPINE 5-1.5 MG/5ML PO SYRP: 5.0000 mL | ORAL_SOLUTION | Freq: Three times a day (TID) | ORAL | Status: DC | PRN

## 2013-08-07 MED ORDER — AZITHROMYCIN 250 MG PO TABS: ORAL_TABLET | ORAL | Status: DC

## 2013-08-07 NOTE — Assessment & Plan Note (Signed)
Likely non flu virus.  Nontoxic.  Would hold abx for now, use if prolonged sx and sputum prod continues.  Hycodan prn cough.  Sedation caution.  F/u prn.  She agrees.

## 2013-08-07 NOTE — Progress Notes (Signed)
Pre-visit discussion using our clinic review tool. No additional management support is needed unless otherwise documented below in the visit note.  Sx started about 3 days ago. Started with cough, then likely fever treated with tylenol.  "I feel bad."  Congestion in chest, taking mucinex w/o much help.  No ear pain.  No wheeze.  Some rhinorrhea with likely resultant ST.  Cough with green sputum, recently noted.  Not SOB.  Cough comes in fits, worse at night.  Former smoker. Possible sick contacts.  Had a flu shot 06/2013. No diffuse aches.    Meds, vitals, and allergies reviewed.   ROS: See HPI.  Otherwise, noncontributory.  GEN: nad, alert and oriented HEENT: mucous membranes moist, tm w/o erythema, nasal exam w/o erythema, clear discharge noted,  OP with cobblestoning, sinuses not ttp B NECK: supple w/o LA CV: rrr.   PULM: ctab, no inc wob EXT: no edema SKIN: no acute rash

## 2013-08-07 NOTE — Patient Instructions (Signed)
Drink plenty of fluids, take tylenol as needed, and gargle with warm salt water for your throat.  Use the cough syrup for now (it can make you drowsy) and then start the antibiotics in a few days if not better. This should gradually improve.  Take care.  Let us know if you have other concerns.

## 2013-08-08 ENCOUNTER — Other Ambulatory Visit: Payer: Self-pay | Admitting: Family Medicine

## 2013-08-18 ENCOUNTER — Telehealth: Payer: Self-pay

## 2013-08-18 MED ORDER — HYDROCODONE-HOMATROPINE 5-1.5 MG/5ML PO SYRP: 5.0000 mL | ORAL_SOLUTION | Freq: Three times a day (TID) | ORAL | Status: DC | PRN

## 2013-08-18 NOTE — Telephone Encounter (Signed)
Pt left v/m; was seen on 08/07/13; pt has finished z pak and hycodan; still having prod cough with clear thick phlegm ans slight wheezing,head and chest congestion and now her ears are popping and trouble hearing out of both ears; ears feel plugged up. No fever,CPor SOB. Pt request med to help with cough and congestion and ears feeling plugged up. [place] Pt request cb.

## 2013-08-18 NOTE — Telephone Encounter (Signed)
I refilled hycodan- she may need it for a while longer Also get mucinex otc to take bid for congestion Nasal saline may also help F/u next week if no further imp

## 2013-08-18 NOTE — Telephone Encounter (Signed)
Left voicemail requesting pt to call office back 

## 2013-08-22 NOTE — Telephone Encounter (Signed)
Pt notified Rx ready for pick up and to try the mucinex OTC and nasal saline, and to f/u if no improvement

## 2013-08-22 NOTE — Telephone Encounter (Signed)
Left detailed message on voicemail and asked patient to call for appt if needed.

## 2013-08-22 NOTE — Telephone Encounter (Signed)
Pt left v/m checking on printed prescription for Hycodan; pt request cb.

## 2013-08-31 ENCOUNTER — Telehealth: Payer: Self-pay | Admitting: *Deleted

## 2013-08-31 NOTE — Telephone Encounter (Signed)
Appointment scheduled.

## 2013-08-31 NOTE — Telephone Encounter (Signed)
Did she end up filling the zpack antibiotic?  Any sputum or fever, or is it a dry cough? If persistent prolonged cough despite abx and feeling ill, would suggest she come in for evaluation

## 2013-08-31 NOTE — Telephone Encounter (Signed)
Patient states she has seen Dr. Damita Dunnings on 08/07/13 and still has the cough that makes her chest hurt.  Patient is asking if she needs to come back in to be seen or at least have a CXR?  Please advise.

## 2013-09-01 ENCOUNTER — Ambulatory Visit (INDEPENDENT_AMBULATORY_CARE_PROVIDER_SITE_OTHER)
Admission: RE | Admit: 2013-09-01 | Discharge: 2013-09-01 | Disposition: A | Discharge status: Home / Self Care | Payer: PRIVATE HEALTH INSURANCE | Source: Ambulatory Visit | Attending: Internal Medicine | Admitting: Internal Medicine

## 2013-09-01 ENCOUNTER — Ambulatory Visit (INDEPENDENT_AMBULATORY_CARE_PROVIDER_SITE_OTHER): Payer: PRIVATE HEALTH INSURANCE | Admitting: Internal Medicine

## 2013-09-01 ENCOUNTER — Encounter: Payer: Self-pay | Admitting: Internal Medicine

## 2013-09-01 VITALS — BP 118/68 | HR 75 | Temp 98.1°F | Wt 142.5 lb

## 2013-09-01 DIAGNOSIS — R059 Cough, unspecified: Secondary | ICD-10-CM

## 2013-09-01 DIAGNOSIS — R05 Cough: Secondary | ICD-10-CM

## 2013-09-01 MED ORDER — HYDROCODONE-HOMATROPINE 5-1.5 MG/5ML PO SYRP: 5.0000 mL | ORAL_SOLUTION | Freq: Three times a day (TID) | ORAL | Status: DC | PRN

## 2013-09-01 MED ORDER — BENZONATATE 200 MG PO CAPS: 200.0000 mg | ORAL_CAPSULE | Freq: Two times a day (BID) | ORAL | Status: DC | PRN

## 2013-09-01 NOTE — Patient Instructions (Signed)

## 2013-09-01 NOTE — Progress Notes (Signed)
HPI  Pt presents to the clinic today with c/o cough. This started around 08/05/13. She was seen for the same by Dr. Damita Dunnings 12/23. It was felt that she had a viral illness at that time, but she had taken a zpack at that time- she did get better at some time. She does report the cough is productive of thick clear mucous. She does have occasional shortness of breath. She is not sure if she is running fevers but she is having night sweats.She has tried Mucinex, Tylenol, Pharmacist, hospital with no relief. She has no history of allergies or asthma. She has had sick contacts. She denies s/s of reflux.  Review of Systems      Past Medical History  Diagnosis Date  . Depression   . Hyperlipidemia   . Anemia 03/2010  . Herniated lumbar intervertebral disc 10/2000    Injections  . History of MRI of brain and brain stem 03/2003    Normal  . Thyroid disease     Hypothyroidism s/p radioactive ablation    Family History  Problem Relation Age of Onset  . Diabetes Mother   . Hypertension Mother   . Cancer Mother     breast  . Heart disease Mother     MI  . Breast cancer Mother   . Cancer Sister     Breast  . Breast cancer Sister   . Colon cancer Neg Hx     History   Social History  . Marital Status: Single    Spouse Name: N/A    Number of Children: 2  . Years of Education: N/A   Occupational History  . 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 Smoking status: Former Smoker    Quit date: 08/17/2004  . Smokeless tobacco: Not on file     Comment: Previously an occasional smoker  . Alcohol Use: Yes     Comment: Occasional  . Drug Use: No  . Sexual Activity: Not on file   Other Topics Concern  . Not on file   Social History Narrative   From [place].   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 Atorvastatin     REACTION: muscle pain  . Bupropion Hcl     REACTION: decreased  BP  . Ezetimibe-Simvastatin     REACTION: nausea  .  Mirtazapine     REACTION: swelling  . Omeprazole-Sodium Bicarbonate     REACTION: GI upset  . Paroxetine     REACTION: felt bad  . Raloxifene     REACTION: leg pain  . Rosuvastatin     REACTION: increased ALT  . Temazepam     nightmares  . Trazodone And Nefazodone     nightmares  . Zolpidem Tartrate     intolerant     Constitutional: Positive headache, fatigue and fever. Denies fever or abrupt weight changes.  HEENT:  Denies eye redness, eye pain, pressure behind the eyes, facial pain, nasal congestion, ear pain, ringing in the ears, wax buildup, runny nose or bloody nose. Respiratory: Positive cough. Denies difficulty breathing or shortness of breath.  Cardiovascular: Denies chest pain, chest tightness, palpitations or swelling in the hands or feet.   No other specific complaints in a complete review of systems (except as listed in HPI above).  Objective:   BP 118/68  Pulse 75  Temp(Src) 98.1 F (36.7 C) (Oral)  Wt 142 lb 8 oz (  64.638 kg)  SpO2 98% Wt Readings from Last 3 Encounters:  09/01/13 142 lb 8 oz (64.638 kg)  08/07/13 143 lb 8 oz (65.091 kg)  12/23/12 135 lb (61.236 kg)     General: Appears her stated age, well developed, well nourished in NAD. HEENT: Head: normal shape and size; Eyes: sclera white, no icterus, conjunctiva pink, PERRLA and EOMs intact; Ears: Tm's gray and intact, normal light reflex; Nose: mucosa pink and moist, septum midline; Throat/Mouth: . Teeth present, mucosa erythematous and moist, no exudate noted, no lesions or ulcerations noted.  Neck: Neck supple, trachea midline. No massses, lumps or thyromegaly present.  Cardiovascular: Normal rate and rhythm. S1,S2 noted.  No murmur, rubs or gallops noted. No JVD or BLE edema. No carotid bruits noted. Pulmonary/Chest: Normal effort and positive vesicular breath sounds. No respiratory distress. No wheezes, rales or ronchi noted.      Assessment & Plan:   Cough:  She has failed a zpack and  Hycodan Will check chest xray to make sure we are not missing something eRx for tessalon pearles  No indication for abx at this time If chest xray normal, consider treatment for reflux versus allergies  RTC as needed or if symptoms persist.

## 2013-09-01 NOTE — Progress Notes (Signed)
Pre-visit discussion using our clinic review tool. No additional management support is needed unless otherwise documented below in the visit note.  

## 2014-02-11 ENCOUNTER — Other Ambulatory Visit: Payer: Self-pay | Admitting: Family Medicine

## 2014-02-12 NOTE — Telephone Encounter (Signed)
Sent, schedule a CPE.  Thanks.

## 2014-02-12 NOTE — Telephone Encounter (Signed)
Electronic refill request. Last office visit:   12/23/12 except for 2 acute visits for cough.  Last Filled:   30 tablet 5 RF on   08/08/2013.  Please advise.

## 2014-02-13 NOTE — Telephone Encounter (Signed)
Pt is aware and has made a lab and a CPE appt for August

## 2014-03-15 ENCOUNTER — Other Ambulatory Visit: Payer: Self-pay | Admitting: Family Medicine

## 2014-03-15 ENCOUNTER — Telehealth: Payer: Self-pay | Admitting: Family Medicine

## 2014-03-15 DIAGNOSIS — E039 Hypothyroidism, unspecified: Secondary | ICD-10-CM

## 2014-03-15 MED ORDER — LEVOTHYROXINE SODIUM 112 MCG PO TABS: ORAL_TABLET | ORAL | Status: DC

## 2014-03-15 NOTE — Telephone Encounter (Signed)
Pt calling in because she had labs ordered and ran by GYN and the TSH and Cholosterol came back elevaetd at 10. She requested that the labs be faxed to you. Wants to know if you received Dr Tomblin's faxed lab results. She wanted to know if her meds need to be adjusted? Thank you

## 2014-03-15 NOTE — Telephone Encounter (Signed)
Usually the ordering MD will address the labs.  If GYN didn't address, and if she had been compliant with 1 tablet by mouth daily except 1/2 tablet by mouth on Fridays (132mcg/tab), then increase her dose to 1 tablet by mouth daily every day.  Recheck tsh in 8 weeks. Thanks.  Orders are in.

## 2014-03-15 NOTE — Telephone Encounter (Signed)
Patient advised.  Lab appt scheduled.  

## 2014-03-28 ENCOUNTER — Other Ambulatory Visit: Payer: PRIVATE HEALTH INSURANCE

## 2014-04-04 ENCOUNTER — Encounter (INDEPENDENT_AMBULATORY_CARE_PROVIDER_SITE_OTHER): Payer: Self-pay

## 2014-04-04 ENCOUNTER — Ambulatory Visit (INDEPENDENT_AMBULATORY_CARE_PROVIDER_SITE_OTHER): Payer: PRIVATE HEALTH INSURANCE | Admitting: Family Medicine

## 2014-04-04 ENCOUNTER — Encounter: Payer: Self-pay | Admitting: Family Medicine

## 2014-04-04 VITALS — BP 108/68 | HR 62 | Temp 98.1°F | Ht 63.25 in | Wt 147.8 lb

## 2014-04-04 DIAGNOSIS — E039 Hypothyroidism, unspecified: Secondary | ICD-10-CM

## 2014-04-04 DIAGNOSIS — Z0000 Encounter for general adult medical examination without abnormal findings: Secondary | ICD-10-CM

## 2014-04-04 DIAGNOSIS — Z7189 Other specified counseling: Secondary | ICD-10-CM

## 2014-04-04 DIAGNOSIS — E785 Hyperlipidemia, unspecified: Secondary | ICD-10-CM

## 2014-04-04 NOTE — Progress Notes (Signed)
Pre visit review using our clinic review tool, if applicable. No additional management support is needed unless otherwise documented below in the visit note.  CPE- See plan.  Routine anticipatory guidance given to patient.  See health maintenance. Pap per gyn DXA done per gyn.   Mammogram done last month per gyn.  Up to date.  Living will d/w pt.  Abigail Marks would be designated if patient were incapacitated.  Tetanus 2014 Flu shot at work.  PNA not due yet.  Shingles not due to until 58.   Diet and exercise d/w pt.  Encouraged both.    Hypothyroidism.  TSH is due for later in the fall.  She feels slow, tired, foggy.  No neck mass, no dysphagia.   Elevated Cholesterol: Using medications without problems:yes Muscle aches: no Diet compliance:yes Exercise:yes She is back on the daily dose of statin.  Tolerating current dose, lipids were up at gyn clinic when on lower dose and then she went back to the daily dose.  Will need f/u labs later.  D/w pt.     PMH and SH reviewed  Meds, vitals, and allergies reviewed.   ROS: See HPI.  Otherwise negative.    GEN: nad, alert and oriented HEENT: mucous membranes moist NECK: supple w/o LA, no tmg CV: rrr. PULM: ctab, no inc wob ABD: soft, +bs EXT: no edema SKIN: no acute rash

## 2014-04-04 NOTE — Patient Instructions (Addendum)
Check with your insurance to see if they will cover the shingles shot at 66.   Recheck lipids and TSH later this fall.  I would get a flu shot each fall.   Take care.  Glad to see you.

## 2014-04-05 DIAGNOSIS — Z7189 Other specified counseling: Secondary | ICD-10-CM | POA: Insufficient documentation

## 2014-04-05 NOTE — Assessment & Plan Note (Signed)
Recheck TSH later in the fall. D/w pt.  Continue as is for now.

## 2014-04-05 NOTE — Assessment & Plan Note (Signed)
Recheck lipids later in the fall. D/w pt.  Continue as is for now.

## 2014-04-05 NOTE — Assessment & Plan Note (Addendum)
Routine anticipatory guidance given to patient.  See health maintenance. Pap per gyn DXA done per gyn.   Mammogram done last month per gyn.  Up to date.  Living will d/w pt.  Abigail Marks would be designated if patient were incapacitated.  Tetanus 2014 Flu shot at work.  PNA not due yet.  Shingles not due to until 58.   Diet and exercise d/w pt.  Encouraged both.

## 2014-04-10 ENCOUNTER — Other Ambulatory Visit: Payer: Self-pay | Admitting: Family Medicine

## 2014-04-12 ENCOUNTER — Encounter: Payer: Self-pay | Admitting: Family Medicine

## 2014-04-12 LAB — GLUCOSE (CC13)
ALT: 26
AST: 22 U/L
CREATININE: 0.76
Cholesterol: 235 mg/dL — AB (ref 0–200)
Glucose: 88
HDL: 58 mg/dL (ref 35–70)
LDL (calc): 147
TSH: 10.224
Triglycerides: 149
Vit D, 25-Hydroxy: 42

## 2014-04-16 ENCOUNTER — Other Ambulatory Visit: Payer: Self-pay | Admitting: Family Medicine

## 2014-05-09 ENCOUNTER — Other Ambulatory Visit (INDEPENDENT_AMBULATORY_CARE_PROVIDER_SITE_OTHER): Payer: PRIVATE HEALTH INSURANCE

## 2014-05-09 DIAGNOSIS — E039 Hypothyroidism, unspecified: Secondary | ICD-10-CM

## 2014-05-09 DIAGNOSIS — E785 Hyperlipidemia, unspecified: Secondary | ICD-10-CM

## 2014-05-09 LAB — LIPID PANEL
CHOL/HDL RATIO: 5
Cholesterol: 212 mg/dL — ABNORMAL HIGH (ref 0–200)
HDL: 44.3 mg/dL (ref >39.00)
NONHDL: 167.7
Triglycerides: 244 mg/dL — ABNORMAL HIGH (ref 0.0–149.0)
VLDL: 48.8 mg/dL — ABNORMAL HIGH (ref 0.0–40.0)

## 2014-05-09 LAB — LDL CHOLESTEROL, DIRECT: LDL DIRECT: 144.8 mg/dL

## 2014-05-09 LAB — TSH: TSH: 8.25 u[IU]/mL — ABNORMAL HIGH (ref 0.35–4.50)

## 2014-05-10 ENCOUNTER — Other Ambulatory Visit: Payer: Self-pay | Admitting: Family Medicine

## 2014-05-10 ENCOUNTER — Other Ambulatory Visit: Payer: PRIVATE HEALTH INSURANCE

## 2014-05-10 DIAGNOSIS — E039 Hypothyroidism, unspecified: Secondary | ICD-10-CM

## 2014-05-10 MED ORDER — LEVOTHYROXINE SODIUM 112 MCG PO TABS: 112.0000 ug | ORAL_TABLET | Freq: Every day | ORAL | Status: DC

## 2014-06-12 ENCOUNTER — Other Ambulatory Visit: Payer: Self-pay | Admitting: Family Medicine

## 2014-06-12 NOTE — Telephone Encounter (Signed)
Sent. Thanks.   

## 2014-06-12 NOTE — Telephone Encounter (Signed)
Received refill request electronically. See allergy/contraindication. Is it okay to refill medication? 

## 2014-07-05 ENCOUNTER — Encounter: Payer: Self-pay | Admitting: Family Medicine

## 2014-07-05 ENCOUNTER — Ambulatory Visit (INDEPENDENT_AMBULATORY_CARE_PROVIDER_SITE_OTHER): Payer: PRIVATE HEALTH INSURANCE | Admitting: Family Medicine

## 2014-07-05 VITALS — BP 118/74 | HR 64 | Temp 98.2°F | Wt 147.5 lb

## 2014-07-05 DIAGNOSIS — K219 Gastro-esophageal reflux disease without esophagitis: Secondary | ICD-10-CM

## 2014-07-05 MED ORDER — FAMOTIDINE 10 MG PO TABS: 10.0000 mg | ORAL_TABLET | Freq: Two times a day (BID) | ORAL | Status: DC

## 2014-07-05 MED ORDER — SUCRALFATE 1 GM/10ML PO SUSP: 1.0000 g | Freq: Three times a day (TID) | ORAL | Status: DC

## 2014-07-05 NOTE — Patient Instructions (Signed)
Cut out the peppermint and cut back on caffeine.  Use pepcid twice a day, carafate up to 4 times a day if needed.  You'll likely be able to wean off the meds after you stop the peppermint.  If not better, then notify me.  Take care.  Glad to see you.

## 2014-07-05 NOTE — Progress Notes (Signed)
Pre visit review using our clinic review tool, if applicable. No additional management support is needed unless otherwise documented below in the visit note.  She was asking about a hiatal hernia.  "It feels like something is stuck (in the stomach, not in the chest) when I eat."  Sx started about 1 weeks ago, more reflux sx in the meantime.  Had been back in the gym, doing more sit ups.   She has had more acidic tasting material in her mouth.  More indigestion, "everything I eat makes my stomach burn."   Hasn't been on H2 blocker.  TUMS helped some with the burning.  Didn't tolerate PPI prev.    No FCNAVD. No rash.  No blood in stool.    1999 imaging with: SMALL HIATAL HERNIA WITH MINIMAL SPONTANEOUS AND INDUCIBLE GE REFLUX.  Meds, vitals, and allergies reviewed.   ROS: See HPI.  Otherwise, noncontributory.  nad ncat Mmm OP wnl Neck supple, no LA rrr ctab Abd soft, slightly ttp near the epigastrum but not ttp o/w, no rebound, normal BS

## 2014-07-06 DIAGNOSIS — K219 Gastro-esophageal reflux disease without esophagitis: Secondary | ICD-10-CM | POA: Insufficient documentation

## 2014-07-06 NOTE — Assessment & Plan Note (Signed)
Has acidic taste in mouth. Has been eating more peppermint.  She'll cut that out, along with caffeine.  Can try H2 blocker in the meantime, carafate prn.   She agrees..  She'll try to wean off meds when improved.  We didn't re-image given her benign exam and her prev imaging.

## 2014-07-11 ENCOUNTER — Other Ambulatory Visit (INDEPENDENT_AMBULATORY_CARE_PROVIDER_SITE_OTHER): Payer: PRIVATE HEALTH INSURANCE

## 2014-07-11 DIAGNOSIS — E039 Hypothyroidism, unspecified: Secondary | ICD-10-CM

## 2014-07-11 LAB — TSH: TSH: 2.55 u[IU]/mL (ref 0.35–4.50)

## 2014-08-17 LAB — HM MAMMOGRAPHY

## 2014-10-30 ENCOUNTER — Other Ambulatory Visit: Payer: Self-pay

## 2014-10-30 NOTE — Telephone Encounter (Signed)
Pt left v/m; pt will be going out of town on 11/01/14 and pt went to get synthroid refilled and was advised by pharmacy that pt was too early to get refill; pt said the instructions were incorrect; pt said should be take 1 tab daily except 1 1/2 tabs on Tuesdays. Pt request new instructions sent to [facility] [address]. Pt request cb when refilled. 07/13/14 was last TSH.

## 2014-10-31 MED ORDER — LEVOTHYROXINE SODIUM 112 MCG PO TABS: 112.0000 ug | ORAL_TABLET | Freq: Every day | ORAL | Status: DC

## 2014-10-31 NOTE — Telephone Encounter (Signed)
Patient notified as instructed by telephone and verbalized understanding. 

## 2014-10-31 NOTE — Telephone Encounter (Signed)
Sent. Thanks.   

## 2014-11-19 ENCOUNTER — Telehealth: Payer: Self-pay | Admitting: Family Medicine

## 2014-11-19 DIAGNOSIS — K219 Gastro-esophageal reflux disease without esophagitis: Secondary | ICD-10-CM

## 2014-11-19 NOTE — Telephone Encounter (Signed)
Left detailed message on voicemail.  

## 2014-11-19 NOTE — Telephone Encounter (Signed)
Ordered. Thanks.  Notify her that we are backed up on referrals, but we are working on them as quickly as possible.

## 2014-11-19 NOTE — Telephone Encounter (Signed)
Pt would like referral to a GI doctor, she prefers Dr. Vira Agar @ [facility] in [place]. Pt requests a c/b. Thank you.

## 2014-11-21 ENCOUNTER — Telehealth: Payer: Self-pay | Admitting: Internal Medicine

## 2014-11-21 NOTE — Telephone Encounter (Signed)
Spoke with Rosaria Ferries and scheduled patient on 11/30/14 at 2:45 PM with Lori Hvozdovic, PA-C.

## 2014-11-22 ENCOUNTER — Ambulatory Visit
Admit: 2014-11-22 | Disposition: A | Discharge status: Home / Self Care | Payer: Self-pay | Attending: Unknown Physician Specialty | Admitting: Unknown Physician Specialty

## 2014-11-30 ENCOUNTER — Ambulatory Visit: Payer: PRIVATE HEALTH INSURANCE | Admitting: Physician Assistant

## 2015-01-21 ENCOUNTER — Telehealth: Payer: Self-pay | Admitting: *Deleted

## 2015-01-21 NOTE — Telephone Encounter (Signed)
Left detailed message on voicemail concerning scheduling mammogram.  Phone number to [facility] provided.

## 2015-01-29 NOTE — Telephone Encounter (Signed)
Erroneous encounter

## 2015-04-24 ENCOUNTER — Other Ambulatory Visit: Payer: Self-pay | Admitting: Family Medicine

## 2015-04-24 NOTE — Telephone Encounter (Signed)
Please scheduled CPE as instructed. 

## 2015-04-24 NOTE — Telephone Encounter (Signed)
Received refill request electronically Last CPE 04/04/14 See allergy/contraindication Is it okay to refill?

## 2015-04-24 NOTE — Telephone Encounter (Signed)
Sent. Thanks.  Needs CPE scheduled.   

## 2015-04-25 NOTE — Telephone Encounter (Signed)
Spoke with pt  She stated she was traveling and would call Monday to schedule

## 2015-06-23 ENCOUNTER — Other Ambulatory Visit: Payer: Self-pay | Admitting: Family Medicine

## 2015-06-24 NOTE — Telephone Encounter (Signed)
Received refill request electronically Last office visit 09/04/13 Has appointment scheduled 07/22/15 See allergy/contraindication Is it okay to refill?

## 2015-06-25 NOTE — Telephone Encounter (Signed)
Sent. Would continue.  Thanks.  

## 2015-06-28 ENCOUNTER — Encounter: Payer: PRIVATE HEALTH INSURANCE | Admitting: Family Medicine

## 2015-07-04 ENCOUNTER — Other Ambulatory Visit: Payer: Self-pay | Admitting: Family Medicine

## 2015-07-05 NOTE — Telephone Encounter (Signed)
Electronically refill request  Last prescribed on 06/12/2014. Last seen on 07/05/2014. CPE schedule on 07/22/2015.

## 2015-07-07 ENCOUNTER — Other Ambulatory Visit: Payer: Self-pay | Admitting: Family Medicine

## 2015-07-07 DIAGNOSIS — E785 Hyperlipidemia, unspecified: Secondary | ICD-10-CM

## 2015-07-07 DIAGNOSIS — E039 Hypothyroidism, unspecified: Secondary | ICD-10-CM

## 2015-07-07 NOTE — Telephone Encounter (Signed)
Sent, thanks

## 2015-07-17 ENCOUNTER — Other Ambulatory Visit: Payer: Self-pay | Admitting: Family Medicine

## 2015-07-17 ENCOUNTER — Other Ambulatory Visit (INDEPENDENT_AMBULATORY_CARE_PROVIDER_SITE_OTHER): Payer: Managed Care, Other (non HMO)

## 2015-07-17 DIAGNOSIS — E039 Hypothyroidism, unspecified: Secondary | ICD-10-CM

## 2015-07-17 DIAGNOSIS — E785 Hyperlipidemia, unspecified: Secondary | ICD-10-CM | POA: Diagnosis not present

## 2015-07-17 DIAGNOSIS — R5383 Other fatigue: Secondary | ICD-10-CM | POA: Diagnosis not present

## 2015-07-17 LAB — CBC WITH DIFFERENTIAL/PLATELET
BASOS PCT: 0.6 % (ref 0.0–3.0)
Basophils Absolute: 0 10*3/uL (ref 0.0–0.1)
EOS ABS: 0.1 10*3/uL (ref 0.0–0.7)
Eosinophils Relative: 2.2 % (ref 0.0–5.0)
HCT: 39.5 % (ref 36.0–46.0)
Hemoglobin: 12.9 g/dL (ref 12.0–15.0)
LYMPHS ABS: 2.6 10*3/uL (ref 0.7–4.0)
Lymphocytes Relative: 39 % (ref 12.0–46.0)
MCHC: 32.7 g/dL (ref 30.0–36.0)
MCV: 93.8 fl (ref 78.0–100.0)
MONO ABS: 0.7 10*3/uL (ref 0.1–1.0)
Monocytes Relative: 10 % (ref 3.0–12.0)
NEUTROS ABS: 3.2 10*3/uL (ref 1.4–7.7)
NEUTROS PCT: 48.2 % (ref 43.0–77.0)
PLATELETS: 233 10*3/uL (ref 150.0–400.0)
RBC: 4.2 Mil/uL (ref 3.87–5.11)
RDW: 12.9 % (ref 11.5–15.5)
WBC: 6.5 10*3/uL (ref 4.0–10.5)

## 2015-07-17 LAB — COMPREHENSIVE METABOLIC PANEL
ALT: 30 U/L (ref 0–35)
AST: 23 U/L (ref 0–37)
Albumin: 4.3 g/dL (ref 3.5–5.2)
Alkaline Phosphatase: 70 U/L (ref 39–117)
BUN: 16 mg/dL (ref 6–23)
CALCIUM: 9.4 mg/dL (ref 8.4–10.5)
CHLORIDE: 104 meq/L (ref 96–112)
CO2: 31 meq/L (ref 19–32)
Creatinine, Ser: 0.78 mg/dL (ref 0.40–1.20)
GFR: 79.82 mL/min (ref >60.00)
Glucose, Bld: 101 mg/dL — ABNORMAL HIGH (ref 70–99)
Potassium: 4.2 mEq/L (ref 3.5–5.1)
SODIUM: 142 meq/L (ref 135–145)
Total Bilirubin: 0.4 mg/dL (ref 0.2–1.2)
Total Protein: 7.4 g/dL (ref 6.0–8.3)

## 2015-07-17 LAB — LIPID PANEL
CHOL/HDL RATIO: 3
Cholesterol: 181 mg/dL (ref 0–200)
HDL: 52.5 mg/dL (ref >39.00)
LDL CALC: 108 mg/dL — AB (ref 0–99)
NONHDL: 128.1
TRIGLYCERIDES: 103 mg/dL (ref 0.0–149.0)
VLDL: 20.6 mg/dL (ref 0.0–40.0)

## 2015-07-17 LAB — TSH: TSH: 1.75 u[IU]/mL (ref 0.35–4.50)

## 2015-07-18 ENCOUNTER — Encounter: Payer: Self-pay | Admitting: Family Medicine

## 2015-07-22 ENCOUNTER — Other Ambulatory Visit: Payer: Self-pay | Admitting: Family Medicine

## 2015-07-22 ENCOUNTER — Encounter: Payer: Self-pay | Admitting: Family Medicine

## 2015-07-22 ENCOUNTER — Ambulatory Visit (INDEPENDENT_AMBULATORY_CARE_PROVIDER_SITE_OTHER): Payer: Managed Care, Other (non HMO) | Admitting: Family Medicine

## 2015-07-22 VITALS — BP 130/76 | HR 84 | Temp 98.5°F | Wt 150.8 lb

## 2015-07-22 DIAGNOSIS — R251 Tremor, unspecified: Secondary | ICD-10-CM | POA: Diagnosis not present

## 2015-07-22 DIAGNOSIS — F32A Depression, unspecified: Secondary | ICD-10-CM

## 2015-07-22 DIAGNOSIS — E785 Hyperlipidemia, unspecified: Secondary | ICD-10-CM

## 2015-07-22 DIAGNOSIS — Z119 Encounter for screening for infectious and parasitic diseases, unspecified: Secondary | ICD-10-CM

## 2015-07-22 DIAGNOSIS — E039 Hypothyroidism, unspecified: Secondary | ICD-10-CM

## 2015-07-22 DIAGNOSIS — Z0000 Encounter for general adult medical examination without abnormal findings: Secondary | ICD-10-CM

## 2015-07-22 DIAGNOSIS — F329 Major depressive disorder, single episode, unspecified: Secondary | ICD-10-CM

## 2015-07-22 MED ORDER — LEVOTHYROXINE SODIUM 112 MCG PO TABS: 112.0000 ug | ORAL_TABLET | Freq: Every day | ORAL | Status: DC

## 2015-07-22 MED ORDER — SIMVASTATIN 20 MG PO TABS: 20.0000 mg | ORAL_TABLET | Freq: Every day | ORAL | Status: DC

## 2015-07-22 MED ORDER — METOPROLOL TARTRATE 25 MG PO TABS: 12.5000 mg | ORAL_TABLET | Freq: Two times a day (BID) | ORAL | Status: DC | PRN

## 2015-07-22 MED ORDER — FLUOXETINE HCL 10 MG PO TABS: 10.0000 mg | ORAL_TABLET | Freq: Every day | ORAL | Status: DC

## 2015-07-22 NOTE — Assessment & Plan Note (Signed)
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  D/w pt about options.  Can try low dose BB vs refer to neuro vs both.  She opts for BB trial, starting with 12.5mg  metoprolol qd and uptitrated as needed.  She'll update me.

## 2015-07-22 NOTE — Assessment & Plan Note (Signed)
TSH wnl, continue as is.  D/w pt.

## 2015-07-22 NOTE — Patient Instructions (Addendum)
Check with your insurance to see if they will cover the shingles shot. Go to the lab on the way out.  We'll contact you with your lab report.  Try the metoprolol for the tremor and update me as needed.  We can set you up with Dr. Carles Collet if needed.  Try to exercise more.  Take care.  Glad to see you.

## 2015-07-22 NOTE — Assessment & Plan Note (Signed)
Continue as is with SSRI and she'll update me after starting BB.  Okay for outpatient f/u.  No SI/HI.

## 2015-07-22 NOTE — Progress Notes (Signed)
Pre visit review using our clinic review tool, if applicable. No additional management support is needed unless otherwise documented below in the visit note.  CPE- See plan.  Routine anticipatory guidance given to patient.  See health maintenance. Pap per gyn, Dr. Gertie Fey in [place] [facility] DXA done per gyn  Mammogram done 2016 per gyn. Up to date.  Living will d/w pt. Abigail Marks would be designated if patient were incapacitated.  Tetanus 2014 Flu shot at work 2016 PNA not due yet.  Shingles d/w pt.   Diet and exercise d/w pt. Encouraged both. "I'm not doing well with either." HIV done prev at GYN clinic.  Done about 6 years ago.   Pt opts in for HCV screening.  D/w pt re: routine screening.    Elevated Cholesterol: Using medications without problems:yes Muscle aches: no Diet compliance: see above Exercise: see above.   Hypothyroid.  TSH wnl, no lumps or masses in the neck.   More tremor recently.  B hands, R>L.  When trying to hold an object, use a fork.  No visible sx when resting her hands on her lap.  No chin tremor.  No specific gait change but some balance changes with walking, can be to either side.  Stride length is still normal.  No falls.   Tremor gradually getting worse for 2-3 years.  Handwriting is affected.  She hasn't had etoh to notice a difference.  occ tingling in the feet, she attributed that to be positional- not progressive.  Mother and sister both had tremors, mother had parkinson but sister doesn't.  Sx are bothersome.  Worse when she is in a stressful situation.    Mood was fair but affect by the tremor, worried/annoyed about the tremor. No ADE on SSRI.   PMH and SH reviewed  Meds, vitals, and allergies reviewed.   ROS: See HPI.  Otherwise negative.    GEN: nad, alert and oriented HEENT: mucous membranes moist NECK: supple w/o LA CV: rrr. PULM: ctab, no inc wob ABD: soft, +bs EXT: no edema CN 2-12 wnl B, S/S/DTR wnl  B Cerebellar testing wnl but faint B hand tremor when holding an object Gait wnl, normal arm swing and stride distance.

## 2015-07-22 NOTE — Assessment & Plan Note (Signed)
Pap per gyn, Dr. Gertie Fey in [place] [facility] DXA done per gyn  Mammogram done 2016 per gyn. Up to date.  Living will d/w pt. Abigail Marks would be designated if patient were incapacitated.  Tetanus 2014 Flu shot at work 2016 PNA not due yet.  Shingles d/w pt.   Diet and exercise d/w pt. Encouraged both. "I'm not doing well with either." HIV done prev at GYN clinic.  Done about 6 years ago.   Pt opts in for HCV screening.  D/w pt re: routine screening.

## 2015-07-22 NOTE — Assessment & Plan Note (Signed)
Reasonable control, needs work on diet and exercise for sugar.  She agrees.  Labs d/w pt.

## 2015-07-23 LAB — HEPATITIS C ANTIBODY: HCV AB: NEGATIVE

## 2015-07-25 ENCOUNTER — Other Ambulatory Visit: Payer: Self-pay | Admitting: Family Medicine

## 2015-10-22 ENCOUNTER — Encounter: Payer: Self-pay | Admitting: *Deleted

## 2015-11-01 ENCOUNTER — Ambulatory Visit: Payer: Managed Care, Other (non HMO) | Admitting: Certified Registered Nurse Anesthetist

## 2015-11-01 ENCOUNTER — Ambulatory Visit
Admission: RE | Admit: 2015-11-01 | Discharge: 2015-11-01 | Disposition: A | Discharge status: Home / Self Care | Payer: Managed Care, Other (non HMO) | Source: Ambulatory Visit | Attending: Ophthalmology | Admitting: Ophthalmology

## 2015-11-01 ENCOUNTER — Encounter
Admission: RE | Disposition: A | Discharge status: Home / Self Care | Payer: Self-pay | Source: Ambulatory Visit | Attending: Ophthalmology

## 2015-11-01 DIAGNOSIS — H268 Other specified cataract: Secondary | ICD-10-CM | POA: Diagnosis present

## 2015-11-01 DIAGNOSIS — D649 Anemia, unspecified: Secondary | ICD-10-CM | POA: Insufficient documentation

## 2015-11-01 DIAGNOSIS — R001 Bradycardia, unspecified: Secondary | ICD-10-CM | POA: Diagnosis not present

## 2015-11-01 DIAGNOSIS — K219 Gastro-esophageal reflux disease without esophagitis: Secondary | ICD-10-CM | POA: Insufficient documentation

## 2015-11-01 DIAGNOSIS — Z90710 Acquired absence of both cervix and uterus: Secondary | ICD-10-CM | POA: Insufficient documentation

## 2015-11-01 DIAGNOSIS — Z9049 Acquired absence of other specified parts of digestive tract: Secondary | ICD-10-CM | POA: Diagnosis not present

## 2015-11-01 DIAGNOSIS — E7800 Pure hypercholesterolemia, unspecified: Secondary | ICD-10-CM | POA: Insufficient documentation

## 2015-11-01 DIAGNOSIS — E039 Hypothyroidism, unspecified: Secondary | ICD-10-CM | POA: Insufficient documentation

## 2015-11-01 DIAGNOSIS — M810 Age-related osteoporosis without current pathological fracture: Secondary | ICD-10-CM | POA: Diagnosis not present

## 2015-11-01 DIAGNOSIS — K449 Diaphragmatic hernia without obstruction or gangrene: Secondary | ICD-10-CM | POA: Diagnosis not present

## 2015-11-01 DIAGNOSIS — F329 Major depressive disorder, single episode, unspecified: Secondary | ICD-10-CM | POA: Diagnosis not present

## 2015-11-01 DIAGNOSIS — Z888 Allergy status to other drugs, medicaments and biological substances status: Secondary | ICD-10-CM | POA: Diagnosis not present

## 2015-11-01 DIAGNOSIS — M1990 Unspecified osteoarthritis, unspecified site: Secondary | ICD-10-CM | POA: Insufficient documentation

## 2015-11-01 DIAGNOSIS — H25042 Posterior subcapsular polar age-related cataract, left eye: Secondary | ICD-10-CM | POA: Insufficient documentation

## 2015-11-01 DIAGNOSIS — Z87891 Personal history of nicotine dependence: Secondary | ICD-10-CM | POA: Insufficient documentation

## 2015-11-01 DIAGNOSIS — H2512 Age-related nuclear cataract, left eye: Secondary | ICD-10-CM | POA: Diagnosis not present

## 2015-11-01 DIAGNOSIS — R251 Tremor, unspecified: Secondary | ICD-10-CM | POA: Diagnosis not present

## 2015-11-01 HISTORY — DX: Other complications of anesthesia, initial encounter: T88.59XA

## 2015-11-01 HISTORY — DX: Unspecified osteoarthritis, unspecified site: M19.90

## 2015-11-01 HISTORY — DX: Gastro-esophageal reflux disease without esophagitis: K21.9

## 2015-11-01 HISTORY — DX: Hypothyroidism, unspecified: E03.9

## 2015-11-01 HISTORY — PX: CATARACT EXTRACTION W/PHACO: SHX586

## 2015-11-01 HISTORY — DX: Nausea with vomiting, unspecified: R11.2

## 2015-11-01 HISTORY — DX: Personal history of other diseases of the digestive system: Z87.19

## 2015-11-01 HISTORY — DX: Other specified postprocedural states: Z98.890

## 2015-11-01 HISTORY — DX: Adverse effect of unspecified anesthetic, initial encounter: T41.45XA

## 2015-11-01 SURGERY — PHACOEMULSIFICATION, CATARACT, WITH IOL INSERTION
Anesthesia: Monitor Anesthesia Care | Laterality: Left

## 2015-11-01 MED ORDER — TETRACAINE HCL 0.5 % OP SOLN
OPHTHALMIC | Status: AC
  Filled 2015-11-01: qty 2

## 2015-11-01 MED ORDER — CYCLOPENTOLATE HCL 2 % OP SOLN
OPHTHALMIC | Status: AC
  Administered 2015-11-01: 1 [drp] via OPHTHALMIC
  Filled 2015-11-01: qty 2

## 2015-11-01 MED ORDER — MOXIFLOXACIN HCL 0.5 % OP SOLN
1.0000 [drp] | OPHTHALMIC | Status: AC | PRN
  Administered 2015-11-01 (×3): 1 [drp] via OPHTHALMIC

## 2015-11-01 MED ORDER — BUPIVACAINE HCL (PF) 0.75 % IJ SOLN
INTRAMUSCULAR | Status: AC
  Filled 2015-11-01: qty 10

## 2015-11-01 MED ORDER — FENTANYL CITRATE (PF) 100 MCG/2ML IJ SOLN: 25.0000 ug | INTRAMUSCULAR | Status: DC | PRN

## 2015-11-01 MED ORDER — EPINEPHRINE HCL 1 MG/ML IJ SOLN
INTRAMUSCULAR | Status: AC
  Filled 2015-11-01: qty 2

## 2015-11-01 MED ORDER — SODIUM CHLORIDE 0.9 % IV SOLN
INTRAVENOUS | Status: DC
  Administered 2015-11-01: 06:00:00 via INTRAVENOUS

## 2015-11-01 MED ORDER — POVIDONE-IODINE 5 % OP SOLN
OPHTHALMIC | Status: DC | PRN
  Administered 2015-11-01: 1 via OPHTHALMIC

## 2015-11-01 MED ORDER — CARBACHOL 0.01 % IO SOLN
INTRAOCULAR | Status: DC | PRN
  Administered 2015-11-01: 0.5 mL via INTRAOCULAR

## 2015-11-01 MED ORDER — CYCLOPENTOLATE HCL 2 % OP SOLN
1.0000 [drp] | OPHTHALMIC | Status: AC | PRN
  Administered 2015-11-01 (×4): 1 [drp] via OPHTHALMIC

## 2015-11-01 MED ORDER — CEFUROXIME OPHTHALMIC INJECTION 1 MG/0.1 ML
INJECTION | OPHTHALMIC | Status: AC
  Filled 2015-11-01: qty 0.1

## 2015-11-01 MED ORDER — POVIDONE-IODINE 5 % OP SOLN
OPHTHALMIC | Status: AC
  Filled 2015-11-01: qty 30

## 2015-11-01 MED ORDER — LIDOCAINE HCL (PF) 4 % IJ SOLN
INTRAOCULAR | Status: DC | PRN
  Administered 2015-11-01: .5 mL via OPHTHALMIC

## 2015-11-01 MED ORDER — NA CHONDROIT SULF-NA HYALURON 40-17 MG/ML IO SOLN
INTRAOCULAR | Status: AC
  Filled 2015-11-01: qty 1

## 2015-11-01 MED ORDER — LIDOCAINE HCL (PF) 4 % IJ SOLN
INTRAMUSCULAR | Status: DC | PRN
  Administered 2015-11-01: 5 mL via OPHTHALMIC

## 2015-11-01 MED ORDER — HYALURONIDASE HUMAN 150 UNIT/ML IJ SOLN
INTRAMUSCULAR | Status: AC
  Filled 2015-11-01: qty 1

## 2015-11-01 MED ORDER — NA CHONDROIT SULF-NA HYALURON 40-17 MG/ML IO SOLN
INTRAOCULAR | Status: DC | PRN
  Administered 2015-11-01: 1 mL via INTRAOCULAR

## 2015-11-01 MED ORDER — TETRACAINE HCL 0.5 % OP SOLN
OPHTHALMIC | Status: DC | PRN
  Administered 2015-11-01: 2 [drp] via OPHTHALMIC

## 2015-11-01 MED ORDER — MIDAZOLAM HCL 2 MG/2ML IJ SOLN
INTRAMUSCULAR | Status: DC | PRN
  Administered 2015-11-01 (×2): 1 mg via INTRAVENOUS

## 2015-11-01 MED ORDER — MOXIFLOXACIN HCL 0.5 % OP SOLN
OPHTHALMIC | Status: AC
  Administered 2015-11-01: 1 [drp] via OPHTHALMIC
  Filled 2015-11-01: qty 3

## 2015-11-01 MED ORDER — ALFENTANIL 500 MCG/ML IJ INJ
INJECTION | INTRAMUSCULAR | Status: DC | PRN
  Administered 2015-11-01: 300 ug via INTRAVENOUS
  Administered 2015-11-01 (×2): 100 ug via INTRAVENOUS

## 2015-11-01 MED ORDER — ONDANSETRON HCL 4 MG/2ML IJ SOLN
INTRAMUSCULAR | Status: DC | PRN
  Administered 2015-11-01: 4 mg via INTRAVENOUS

## 2015-11-01 MED ORDER — CEFUROXIME OPHTHALMIC INJECTION 1 MG/0.1 ML
INJECTION | OPHTHALMIC | Status: DC | PRN
  Administered 2015-11-01: 0.1 mL via INTRACAMERAL

## 2015-11-01 MED ORDER — MOXIFLOXACIN HCL 0.5 % OP SOLN
OPHTHALMIC | Status: DC | PRN
  Administered 2015-11-01: 2 [drp] via OPHTHALMIC

## 2015-11-01 MED ORDER — EPINEPHRINE HCL 1 MG/ML IJ SOLN
INTRAMUSCULAR | Status: DC | PRN
  Administered 2015-11-01: 250 mL via OPHTHALMIC

## 2015-11-01 MED ORDER — ONDANSETRON HCL 4 MG/2ML IJ SOLN: 4.0000 mg | Freq: Once | INTRAMUSCULAR | Status: DC | PRN

## 2015-11-01 MED ORDER — PHENYLEPHRINE HCL 10 % OP SOLN
1.0000 [drp] | OPHTHALMIC | Status: AC | PRN
  Administered 2015-11-01 (×4): 1 [drp] via OPHTHALMIC

## 2015-11-01 MED ORDER — PHENYLEPHRINE HCL 10 % OP SOLN
OPHTHALMIC | Status: AC
  Administered 2015-11-01: 1 [drp] via OPHTHALMIC
  Filled 2015-11-01: qty 5

## 2015-11-01 MED ORDER — LIDOCAINE HCL (PF) 4 % IJ SOLN
INTRAMUSCULAR | Status: AC
  Filled 2015-11-01: qty 5

## 2015-11-01 SURGICAL SUPPLY — 30 items
CANNULA ANT/CHMB 27GA (MISCELLANEOUS) ×3 IMPLANT
CORD BIP STRL DISP 12FT (MISCELLANEOUS) ×3 IMPLANT
CUP MEDICINE 2OZ PLAST GRAD ST (MISCELLANEOUS) ×3 IMPLANT
DRAPE XRAY CASSETTE 23X24 (DRAPES) ×3 IMPLANT
ERASER HMR WETFIELD 18G (MISCELLANEOUS) ×3 IMPLANT
GLOVE BIO SURGEON STRL SZ8 (GLOVE) ×3 IMPLANT
GLOVE SURG LX 6.5 MICRO (GLOVE) ×2
GLOVE SURG LX 8.0 MICRO (GLOVE) ×2
GLOVE SURG LX STRL 6.5 MICRO (GLOVE) ×1 IMPLANT
GLOVE SURG LX STRL 8.0 MICRO (GLOVE) ×1 IMPLANT
GOWN STRL REUS W/ TWL LRG LVL3 (GOWN DISPOSABLE) ×1 IMPLANT
GOWN STRL REUS W/ TWL XL LVL3 (GOWN DISPOSABLE) ×1 IMPLANT
GOWN STRL REUS W/TWL LRG LVL3 (GOWN DISPOSABLE) ×2
GOWN STRL REUS W/TWL XL LVL3 (GOWN DISPOSABLE) ×2
LENS IOL ACRSF IQ ULTRA 23.0 (Intraocular Lens) ×1 IMPLANT
LENS IOL ACRYSOF IQ 23.0 (Intraocular Lens) ×3 IMPLANT
PACK CATARACT (MISCELLANEOUS) ×3 IMPLANT
PACK CATARACT DINGLEDEIN LX (MISCELLANEOUS) ×3 IMPLANT
PACK EYE AFTER SURG (MISCELLANEOUS) ×3 IMPLANT
SHLD EYE VISITEC  UNIV (MISCELLANEOUS) ×3 IMPLANT
SOL BSS BAG (MISCELLANEOUS) ×3
SOL PREP PVP 2OZ (MISCELLANEOUS)
SOLUTION BSS BAG (MISCELLANEOUS) ×1 IMPLANT
SOLUTION PREP PVP 2OZ (MISCELLANEOUS) IMPLANT
SUT SILK 5-0 (SUTURE) ×3 IMPLANT
SYR 3ML LL SCALE MARK (SYRINGE) ×3 IMPLANT
SYR 5ML LL (SYRINGE) ×3 IMPLANT
SYR TB 1ML 27GX1/2 LL (SYRINGE) ×3 IMPLANT
WATER STERILE IRR 1000ML POUR (IV SOLUTION) ×3 IMPLANT
WIPE NON LINTING 3.25X3.25 (MISCELLANEOUS) ×3 IMPLANT

## 2015-11-01 NOTE — Anesthesia Postprocedure Evaluation (Signed)
Anesthesia Post Note  Patient: Abigail Marks  Procedure(s) Performed: Procedure(s) (LRB): CATARACT EXTRACTION PHACO AND INTRAOCULAR LENS PLACEMENT (IOC) (Left)  Patient location during evaluation: Short Stay Anesthesia Type: MAC Level of consciousness: awake and alert and oriented Pain management: satisfactory to patient Vital Signs Assessment: post-procedure vital signs reviewed and stable Respiratory status: respiratory function stable Cardiovascular status: stable Anesthetic complications: no    Last Vitals:  Filed Vitals:   11/01/15 0607  BP: 136/54  Pulse: 47  Temp: 37 C  Resp: 16    Last Pain: There were no vitals filed for this visit.               Blima Singer

## 2015-11-01 NOTE — Transfer of Care (Signed)
Immediate Anesthesia Transfer of Care Note  Patient: Abigail Marks  Procedure(s) Performed: Procedure(s) with comments: CATARACT EXTRACTION PHACO AND INTRAOCULAR LENS PLACEMENT (IOC) (Left) - [place]            1:05 AP%        23 CDE       28.79       fluid casette lot # [id number]  exp5/31/2018  Patient Location: Short Stay  Anesthesia Type:MAC  Level of Consciousness: awake, alert  and oriented  Airway & Oxygen Therapy: Patient Spontanous Breathing  Post-op Assessment: Report given to RN and Post -op Vital signs reviewed and stable  Post vital signs: Reviewed and stable  Last Vitals:  Filed Vitals:   11/01/15 0607  BP: 136/54  Pulse: 47  Temp: 37 C  Resp: 16    Complications: No apparent anesthesia complications

## 2015-11-01 NOTE — Anesthesia Preprocedure Evaluation (Signed)
Anesthesia Evaluation  Patient identified by MRN, date of birth, ID band Patient awake    History of Anesthesia Complications (+) PONV  Airway Mallampati: II       Dental  (+) Teeth Intact   Pulmonary Recent URI , former smoker,     + decreased breath sounds      Cardiovascular Exercise Tolerance: Good  Rhythm:Regular Rate:Bradycardia     Neuro/Psych    GI/Hepatic Neg liver ROS, hiatal hernia, GERD  ,  Endo/Other  Hypothyroidism   Renal/GU negative Renal ROS     Musculoskeletal   Abdominal   Peds  Hematology  (+) anemia ,   Anesthesia Other Findings   Reproductive/Obstetrics                             Anesthesia Physical Anesthesia Plan  ASA: II  Anesthesia Plan: MAC   Post-op Pain Management:    Induction: Intravenous  Airway Management Planned: Natural Airway and Nasal Cannula  Additional Equipment:   Intra-op Plan:   Post-operative Plan:   Informed Consent: I have reviewed the patients History and Physical, chart, labs and discussed the procedure including the risks, benefits and alternatives for the proposed anesthesia with the patient or authorized representative who has indicated his/her understanding and acceptance.     Plan Discussed with: CRNA  Anesthesia Plan Comments:         Anesthesia Quick Evaluation

## 2015-11-01 NOTE — Op Note (Signed)
Date of Surgery: 11/01/2015 Date of Dictation: 11/01/2015 8:23 AM Pre-operative Diagnosis:  Nuclear Sclerotic Cataract and Posterior Subcapsular Cataract left Eye Post-operative Diagnosis: same Procedure performed: Extra-capsular Cataract Extraction (ECCE) with placement of a posterior chamber intraocular lens (IOL) left Eye IOL:  Implant Name Type Inv. Item Serial No. Manufacturer Lot No. LRB No. Used  LENS IOL ACRYSOF IQ 23.0 - [id number] Intraocular Lens LENS IOL ACRYSOF IQ 23.0 [id number] ALCON   Left 1   Anesthesia: 2% Lidocaine and 4% Marcaine in a 50/50 mixture with 10 unites/ml of Hylenex given as a peribulbar Anesthesiologist: Anesthesiologist: Gijsbertus F Boston Service, MD CRNA: Demetrius Charity, CRNA Complications: none Estimated Blood Loss: less than 1 ml  Description of procedure:  The patient was given anesthesia and sedation via intravenous access. The patient was then prepped and draped in the usual fashion. A 25-gauge needle was bent for initiating the capsulorhexis. A 5-0 silk suture was placed through the conjunctiva superior and inferiorly to serve as bridle sutures. Hemostasis was obtained at the superior limbus using an eraser cautery. A partial thickness groove was made at the anterior surgical limbus with a 64 Beaver blade and this was dissected anteriorly with an Avaya. The anterior chamber was entered at 10 o'clock with a 1.0 mm paracentesis knife and through the lamellar dissection with a 2.6 mm Alcon keratome. Epi-Shugarcaine 0.5 CC [9 cc BSS Plus (Alcon), 3 cc 4% preservative-free lidocaine (Hospira) and 4 cc 1:1000 preservative-free, bisulfite-free epinephrine] was injected into the anterior chamber via the paracentesis tract. Epi-Shugarcaine 0.5 CC [9 cc BSS Plus (Alcon), 3 cc 4% preservative-free lidocaine (Hospira) and 4 cc 1:1000 preservative-free, bisulfite-free epinephrine] was injected into the anterior chamber via the paracentesis tract. DiscoVisc  was injected to replace the aqueous and a continuous tear curvilinear capsulorhexis was performed using a bent 25-gauge needle.  Balance salt on a syringe was used to perform hydro-dissection and phacoemulsification was carried out using a divide and conquer technique. Procedure(s) with comments: CATARACT EXTRACTION PHACO AND INTRAOCULAR LENS PLACEMENT (IOC) (Left) - [place]            1:05 AP%        23 CDE       28.79       fluid casette lot # [id number]  exp5/31/2018. Irrigation/aspiration was used to remove the residual cortex and the capsular bag was inflated with DiscoVisc. The intraocular lens was inserted into the capsular bag using a pre-loaded UltraSert Delivery System. Irrigation/aspiration was used to remove the residual DiscoVisc. The wound was inflated with balanced salt and checked for leaks. None were found. Miostat was injected via the paracentesis track and 0.1 ml of cefuroxime containing 1 mg of drug  was injected via the paracentesis track. The wound was checked for leaks again and none were found.   The bridal sutures were removed and two drops of Vigamox were placed on the eye. An eye shield was placed to protect the eye and the patient was discharged to the recovery area in good condition.   Khaleef Ruby MD

## 2015-11-01 NOTE — Interval H&P Note (Signed)
History and Physical Interval Note:  11/01/2015 7:24 AM  Abigail Marks  has presented today for surgery, with the diagnosis of CATARACT  The various methods of treatment have been discussed with the patient and family. After consideration of risks, benefits and other options for treatment, the patient has consented to  Procedure(s): CATARACT EXTRACTION PHACO AND INTRAOCULAR LENS PLACEMENT ([place]) (Left) as a surgical intervention .  The patient's history has been reviewed, patient examined, no change in status, stable for surgery.  I have reviewed the patient's chart and labs.  Questions were answered to the patient's satisfaction.     Trenesha Alcaide

## 2015-11-01 NOTE — Anesthesia Procedure Notes (Signed)
Procedure Name: MAC Performed by: Kiran Lapine Pre-anesthesia Checklist: Patient identified, Suction available, Emergency Drugs available, Patient being monitored and Timeout performed Oxygen Delivery Method: Nasal cannula       

## 2015-11-01 NOTE — H&P (Signed)
See scanned note.

## 2015-11-01 NOTE — Discharge Instructions (Signed)
Eye Surgery Discharge Instructions  Expect mild scratchy sensation or mild soreness. DO NOT RUB YOUR EYE!  The day of surgery:  Minimal physical activity, but bed rest is not required  No reading, computer work, or close hand work  No bending, lifting, or straining.  May watch TV  For 24 hours:  No driving, legal decisions, or alcoholic beverages  Safety precautions  Eat anything you prefer: It is better to start with liquids, then soup then solid foods.  _____ Eye patch should be worn until postoperative exam tomorrow.  ____ Solar shield eyeglasses should be worn for comfort in the sunlight/patch while sleeping  Resume all regular medications including aspirin or Coumadin if these were discontinued prior to surgery. You may shower, bathe, shave, or wash your hair. Tylenol may be taken for mild discomfort.  Call your doctor if you experience significant pain, nausea, or vomiting, fever > 101 or other signs of infection. [phone] or [phone] Specific instructions:  Follow-up Information    Follow up with Estill Cotta, MD On 11/02/2015.   Specialty:  Ophthalmology   Why:  9:00   Contact information:   [address]   [place] [address] [phone]

## 2015-11-08 ENCOUNTER — Encounter: Payer: Self-pay | Admitting: *Deleted

## 2015-11-10 NOTE — H&P (Signed)
See scanned note.

## 2015-11-11 ENCOUNTER — Ambulatory Visit: Payer: Managed Care, Other (non HMO) | Admitting: Anesthesiology

## 2015-11-11 ENCOUNTER — Ambulatory Visit
Admission: RE | Admit: 2015-11-11 | Discharge: 2015-11-11 | Disposition: A | Discharge status: Home / Self Care | Payer: Managed Care, Other (non HMO) | Source: Ambulatory Visit | Attending: Ophthalmology | Admitting: Ophthalmology

## 2015-11-11 ENCOUNTER — Encounter: Payer: Self-pay | Admitting: *Deleted

## 2015-11-11 ENCOUNTER — Encounter
Admission: RE | Disposition: A | Discharge status: Home / Self Care | Payer: Self-pay | Source: Ambulatory Visit | Attending: Ophthalmology

## 2015-11-11 DIAGNOSIS — E7800 Pure hypercholesterolemia, unspecified: Secondary | ICD-10-CM | POA: Diagnosis not present

## 2015-11-11 DIAGNOSIS — Z9842 Cataract extraction status, left eye: Secondary | ICD-10-CM | POA: Diagnosis not present

## 2015-11-11 DIAGNOSIS — M1990 Unspecified osteoarthritis, unspecified site: Secondary | ICD-10-CM | POA: Insufficient documentation

## 2015-11-11 DIAGNOSIS — Z9049 Acquired absence of other specified parts of digestive tract: Secondary | ICD-10-CM | POA: Diagnosis not present

## 2015-11-11 DIAGNOSIS — K219 Gastro-esophageal reflux disease without esophagitis: Secondary | ICD-10-CM | POA: Insufficient documentation

## 2015-11-11 DIAGNOSIS — M810 Age-related osteoporosis without current pathological fracture: Secondary | ICD-10-CM | POA: Diagnosis not present

## 2015-11-11 DIAGNOSIS — R001 Bradycardia, unspecified: Secondary | ICD-10-CM | POA: Diagnosis not present

## 2015-11-11 DIAGNOSIS — Z888 Allergy status to other drugs, medicaments and biological substances status: Secondary | ICD-10-CM | POA: Insufficient documentation

## 2015-11-11 DIAGNOSIS — R05 Cough: Secondary | ICD-10-CM | POA: Insufficient documentation

## 2015-11-11 DIAGNOSIS — Z90710 Acquired absence of both cervix and uterus: Secondary | ICD-10-CM | POA: Diagnosis not present

## 2015-11-11 DIAGNOSIS — R251 Tremor, unspecified: Secondary | ICD-10-CM | POA: Diagnosis not present

## 2015-11-11 DIAGNOSIS — F329 Major depressive disorder, single episode, unspecified: Secondary | ICD-10-CM | POA: Diagnosis not present

## 2015-11-11 DIAGNOSIS — Z87891 Personal history of nicotine dependence: Secondary | ICD-10-CM | POA: Diagnosis not present

## 2015-11-11 DIAGNOSIS — H2511 Age-related nuclear cataract, right eye: Secondary | ICD-10-CM | POA: Insufficient documentation

## 2015-11-11 DIAGNOSIS — E039 Hypothyroidism, unspecified: Secondary | ICD-10-CM | POA: Insufficient documentation

## 2015-11-11 DIAGNOSIS — K449 Diaphragmatic hernia without obstruction or gangrene: Secondary | ICD-10-CM | POA: Diagnosis not present

## 2015-11-11 HISTORY — PX: CATARACT EXTRACTION W/PHACO: SHX586

## 2015-11-11 SURGERY — PHACOEMULSIFICATION, CATARACT, WITH IOL INSERTION
Anesthesia: Monitor Anesthesia Care | Site: Eye | Laterality: Right | Wound class: Clean

## 2015-11-11 MED ORDER — EPINEPHRINE HCL 1 MG/ML IJ SOLN
INTRAMUSCULAR | Status: DC | PRN
  Administered 2015-11-11: 1 mL via OPHTHALMIC

## 2015-11-11 MED ORDER — LIDOCAINE HCL (PF) 4 % IJ SOLN
INTRAOCULAR | Status: DC | PRN
  Administered 2015-11-11: .5 mL via OPHTHALMIC

## 2015-11-11 MED ORDER — POVIDONE-IODINE 5 % OP SOLN
OPHTHALMIC | Status: AC
  Filled 2015-11-11: qty 30

## 2015-11-11 MED ORDER — TETRACAINE HCL 0.5 % OP SOLN
OPHTHALMIC | Status: AC
  Filled 2015-11-11: qty 2

## 2015-11-11 MED ORDER — EPINEPHRINE HCL 1 MG/ML IJ SOLN
INTRAMUSCULAR | Status: AC
  Filled 2015-11-11: qty 2

## 2015-11-11 MED ORDER — CEFUROXIME OPHTHALMIC INJECTION 1 MG/0.1 ML
INJECTION | OPHTHALMIC | Status: AC
  Filled 2015-11-11: qty 0.1

## 2015-11-11 MED ORDER — CYCLOPENTOLATE HCL 2 % OP SOLN
1.0000 [drp] | OPHTHALMIC | Status: AC | PRN
  Administered 2015-11-11 (×4): 1 [drp] via OPHTHALMIC

## 2015-11-11 MED ORDER — TETRACAINE HCL 0.5 % OP SOLN
OPHTHALMIC | Status: DC | PRN
  Administered 2015-11-11: 1 [drp] via OPHTHALMIC

## 2015-11-11 MED ORDER — HYALURONIDASE HUMAN 150 UNIT/ML IJ SOLN
INTRAMUSCULAR | Status: AC
  Filled 2015-11-11: qty 1

## 2015-11-11 MED ORDER — SODIUM CHLORIDE 0.9 % IV SOLN
INTRAVENOUS | Status: DC
  Administered 2015-11-11 (×2): via INTRAVENOUS

## 2015-11-11 MED ORDER — BUPIVACAINE HCL (PF) 0.75 % IJ SOLN
INTRAMUSCULAR | Status: AC
  Filled 2015-11-11: qty 10

## 2015-11-11 MED ORDER — PHENYLEPHRINE HCL 10 % OP SOLN
1.0000 [drp] | OPHTHALMIC | Status: AC | PRN
  Administered 2015-11-11 (×4): 1 [drp] via OPHTHALMIC

## 2015-11-11 MED ORDER — MOXIFLOXACIN HCL 0.5 % OP SOLN
1.0000 [drp] | OPHTHALMIC | Status: AC | PRN
  Administered 2015-11-11 (×3): 1 [drp] via OPHTHALMIC

## 2015-11-11 MED ORDER — NA CHONDROIT SULF-NA HYALURON 40-17 MG/ML IO SOLN
INTRAOCULAR | Status: DC | PRN
  Administered 2015-11-11: 1 mL via INTRAOCULAR

## 2015-11-11 MED ORDER — ALFENTANIL 500 MCG/ML IJ INJ
INJECTION | INTRAMUSCULAR | Status: DC | PRN
  Administered 2015-11-11: 750 ug via INTRAVENOUS

## 2015-11-11 MED ORDER — POVIDONE-IODINE 5 % OP SOLN
OPHTHALMIC | Status: DC | PRN
  Administered 2015-11-11: 1 via OPHTHALMIC

## 2015-11-11 MED ORDER — LIDOCAINE HCL (PF) 4 % IJ SOLN
INTRAMUSCULAR | Status: AC
  Filled 2015-11-11: qty 5

## 2015-11-11 MED ORDER — LIDOCAINE HCL (PF) 4 % IJ SOLN
INTRAMUSCULAR | Status: DC | PRN
  Administered 2015-11-11: 4 mL via OPHTHALMIC

## 2015-11-11 MED ORDER — CEFUROXIME OPHTHALMIC INJECTION 1 MG/0.1 ML
INJECTION | OPHTHALMIC | Status: DC | PRN
  Administered 2015-11-11: .1 mL via INTRACAMERAL

## 2015-11-11 MED ORDER — MIDAZOLAM HCL 2 MG/2ML IJ SOLN
INTRAMUSCULAR | Status: DC | PRN
  Administered 2015-11-11 (×2): 1 mg via INTRAVENOUS

## 2015-11-11 MED ORDER — NA CHONDROIT SULF-NA HYALURON 40-17 MG/ML IO SOLN
INTRAOCULAR | Status: AC
  Filled 2015-11-11: qty 1

## 2015-11-11 MED ORDER — CARBACHOL 0.01 % IO SOLN
INTRAOCULAR | Status: DC | PRN
  Administered 2015-11-11: .5 mL via INTRAOCULAR

## 2015-11-11 MED ORDER — ONDANSETRON HCL 4 MG/2ML IJ SOLN: INTRAMUSCULAR | Status: DC | PRN

## 2015-11-11 MED ORDER — ONDANSETRON HCL 4 MG/2ML IJ SOLN
INTRAMUSCULAR | Status: DC | PRN
  Administered 2015-11-11: 4 mg via INTRAVENOUS

## 2015-11-11 MED ORDER — MOXIFLOXACIN HCL 0.5 % OP SOLN
OPHTHALMIC | Status: DC | PRN
  Administered 2015-11-11: 1 [drp] via OPHTHALMIC

## 2015-11-11 SURGICAL SUPPLY — 30 items

## 2015-11-11 NOTE — Discharge Instructions (Addendum)
See handout.Eye Surgery Discharge Instructions ° °Expect mild scratchy sensation or mild soreness. °DO NOT RUB YOUR EYE! ° °The day of surgery: °• Minimal physical activity, but bed rest is not required °• No reading, computer work, or close hand work °• No bending, lifting, or straining. °• May watch TV ° °For 24 hours: °• No driving, legal decisions, or alcoholic beverages °• Safety precautions °• Eat anything you prefer: It is better to start with liquids, then soup then solid foods. °• _____ Eye patch should be worn until postoperative exam tomorrow. °• ____ Solar shield eyeglasses should be worn for comfort in the sunlight/patch while sleeping ° °Resume all regular medications including aspirin or Coumadin if these were discontinued prior to surgery. °You may shower, bathe, shave, or wash your hair. °Tylenol may be taken for mild discomfort. ° °Call your doctor if you experience significant pain, nausea, or vomiting, fever > 101 or other signs of infection. [phone] or [phone] °Specific instructions: ° ° °

## 2015-11-11 NOTE — Anesthesia Postprocedure Evaluation (Signed)
Anesthesia Post Note  Patient: Abigail Marks  Procedure(s) Performed: Procedure(s) (LRB): CATARACT EXTRACTION PHACO AND INTRAOCULAR LENS PLACEMENT (IOC) (Right)  Patient location during evaluation: Short Stay Anesthesia Type: MAC Level of consciousness: awake Pain management: pain level controlled Vital Signs Assessment: post-procedure vital signs reviewed and stable Respiratory status: spontaneous breathing Cardiovascular status: blood pressure returned to baseline Postop Assessment: no headache Anesthetic complications: no    Last Vitals:  Filed Vitals:   11/11/15 1059  BP: 154/53  Pulse: 73  Temp: 35.9 C  Resp: 18    Last Pain:  Filed Vitals:   11/11/15 1100  PainSc: 3                  Buckner Malta

## 2015-11-11 NOTE — Anesthesia Preprocedure Evaluation (Signed)
Anesthesia Evaluation  Patient identified by MRN, date of birth, ID band Patient awake    Reviewed: Allergy & Precautions, NPO status , Patient's Chart, lab work & pertinent test results  History of Anesthesia Complications (+) PONV and history of anesthetic complications  Airway Mallampati: II       Dental  (+) Teeth Intact   Pulmonary Recent URI , Residual Cough, former smoker,     (-) decreased breath sounds      Cardiovascular Exercise Tolerance: Good negative cardio ROS   Rhythm:Regular Rate:Bradycardia     Neuro/Psych PSYCHIATRIC DISORDERS Depression negative neurological ROS     GI/Hepatic Neg liver ROS, hiatal hernia, GERD  Medicated and Controlled,  Endo/Other  Hypothyroidism   Renal/GU negative Renal ROS  negative genitourinary   Musculoskeletal  (+) Arthritis , Osteoarthritis,    Abdominal   Peds negative pediatric ROS (+)  Hematology  (+) anemia ,   Anesthesia Other Findings   Reproductive/Obstetrics                             Anesthesia Physical  Anesthesia Plan  ASA: II  Anesthesia Plan: MAC and General   Post-op Pain Management:    Induction: Intravenous  Airway Management Planned: Natural Airway and Nasal Cannula  Additional Equipment:   Intra-op Plan:   Post-operative Plan:   Informed Consent: I have reviewed the patients History and Physical, chart, labs and discussed the procedure including the risks, benefits and alternatives for the proposed anesthesia with the patient or authorized representative who has indicated his/her understanding and acceptance.     Plan Discussed with: CRNA  Anesthesia Plan Comments:         Anesthesia Quick Evaluation

## 2015-11-11 NOTE — Interval H&P Note (Signed)
History and Physical Interval Note:  11/11/2015 12:03 PM  Abigail Marks  has presented today for surgery, with the diagnosis of CATARACT  The various methods of treatment have been discussed with the patient and family. After consideration of risks, benefits and other options for treatment, the patient has consented to  Procedure(s): CATARACT EXTRACTION PHACO AND INTRAOCULAR LENS PLACEMENT ([place]) (Right) as a surgical intervention .  The patient's history has been reviewed, patient examined, no change in status, stable for surgery.  I have reviewed the patient's chart and labs.  Questions were answered to the patient's satisfaction.     Christal Lagerstrom

## 2015-11-11 NOTE — Op Note (Signed)
Date of Surgery: 11/11/2015 Date of Dictation: 11/11/2015 12:47 PM Pre-operative Diagnosis:  Nuclear Sclerotic Cataract right Eye Post-operative Diagnosis: same Procedure performed: Extra-capsular Cataract Extraction (ECCE) with placement of a posterior chamber intraocular lens (IOL) right Eye IOL:  Implant Name Type Inv. Item Serial No. Manufacturer Lot No. LRB No. Used  LENS IOL ACRYSOF IQ 22.5 - [id number] Intraocular Lens LENS IOL ACRYSOF IQ 22.5 [id number] ALCON   Right 1   Anesthesia: 2% Lidocaine and 4% Marcaine in a 50/50 mixture with 10 unites/ml of Hylenex given as a peribulbar Anesthesiologist: Anesthesiologist: Alvin Critchley, MD CRNA: Allean Found, CRNA Complications: none Estimated Blood Loss: less than 1 ml  Description of procedure:  The patient was given anesthesia and sedation via intravenous access. The patient was then prepped and draped in the usual fashion. A 25-gauge needle was bent for initiating the capsulorhexis. A 5-0 silk suture was placed through the conjunctiva superior and inferiorly to serve as bridle sutures. Hemostasis was obtained at the superior limbus using an eraser cautery. A partial thickness groove was made at the anterior surgical limbus with a 64 Beaver blade and this was dissected anteriorly with an Avaya. The anterior chamber was entered at 10 o'clock with a 1.0 mm paracentesis knife and through the lamellar dissection with a 2.6 mm Alcon keratome. Epi-Shugarcaine 0.5 CC [9 cc BSS Plus (Alcon), 3 cc 4% preservative-free lidocaine (Hospira) and 4 cc 1:1000 preservative-free, bisulfite-free epinephrine] was injected into the anterior chamber via the paracentesis tract. Epi-Shugarcaine 0.5 CC [9 cc BSS Plus (Alcon), 3 cc 4% preservative-free lidocaine (Hospira) and 4 cc 1:1000 preservative-free, bisulfite-free epinephrine] was injected into the anterior chamber via the paracentesis tract. DiscoVisc was injected to replace the aqueous and a  continuous tear curvilinear capsulorhexis was performed using a bent 25-gauge needle.  Balance salt on a syringe was used to perform hydro-dissection and phacoemulsification was carried out using a divide and conquer technique. Procedure(s) with comments: CATARACT EXTRACTION PHACO AND INTRAOCULAR LENS PLACEMENT (IOC) (Right) - [place] 00:55 AP% 23.0 CDE 22.84 fluid pack lot # [id number]. Irrigation/aspiration was used to remove the residual cortex and the capsular bag was inflated with DiscoVisc. The intraocular lens was inserted into the capsular bag using a pre-loaded UltraSert Delivery System. Irrigation/aspiration was used to remove the residual DiscoVisc. The wound was inflated with balanced salt and checked for leaks. None were found. Miostat was injected via the paracentesis track and 0.1 ml of cefuroxime containing 1 mg of drug  was injected via the paracentesis track. The wound was checked for leaks again and none were found.   The bridal sutures were removed and two drops of Vigamox were placed on the eye. An eye shield was placed to protect the eye and the patient was discharged to the recovery area in good condition.   Javelle Donigan MD

## 2015-11-11 NOTE — Anesthesia Procedure Notes (Signed)
Procedure Name: MAC Date/Time: 11/11/2015 12:10 PM Performed by: Allean Found Pre-anesthesia Checklist: Patient identified, Emergency Drugs available, Suction available, Patient being monitored and Timeout performed Patient Re-evaluated:Patient Re-evaluated prior to inductionOxygen Delivery Method: Nasal cannula Intubation Type: IV induction

## 2015-11-11 NOTE — Transfer of Care (Signed)
Immediate Anesthesia Transfer of Care Note  Patient: Abigail Marks  Procedure(s) Performed: Procedure(s) with comments: CATARACT EXTRACTION PHACO AND INTRAOCULAR LENS PLACEMENT ([facility]) (Right) - [place] 00:55 AP% 23.0 CDE 22.84 fluid pack lot # [id number]  Patient Location: PACU  Anesthesia Type:MAC  Level of Consciousness: awake  Airway & Oxygen Therapy: Patient Spontanous Breathing  Post-op Assessment: Report given to RN and Post -op Vital signs reviewed and stable  Post vital signs: Reviewed and stable  Last Vitals:  Filed Vitals:   11/11/15 1059  BP: 154/53  Pulse: 73  Temp: 35.9 C  Resp: 18    Complications: No apparent anesthesia complications

## 2015-11-12 ENCOUNTER — Encounter: Payer: Self-pay | Admitting: Ophthalmology

## 2016-01-08 ENCOUNTER — Encounter: Payer: Self-pay | Admitting: Ophthalmology

## 2016-02-19 ENCOUNTER — Telehealth: Payer: Self-pay

## 2016-02-19 MED ORDER — FLUOXETINE HCL 20 MG PO TABS: 20.0000 mg | ORAL_TABLET | Freq: Every day | ORAL | Status: DC

## 2016-02-19 NOTE — Telephone Encounter (Signed)
Pt left v/m requesting to increase fluoxetine 10 mg to 20 mg; pt has been on fluoxetine 10 mg for a while and does not seem as effective for depression. No SI/HI.Pt also wanted to know if needed to take 10 mg bid or 20 mg once a day. Pt request cb.  Pt request new rx with updated instructions to [place] aid [address]. Last annual exam 07/22/2015.

## 2016-02-19 NOTE — Telephone Encounter (Signed)
Patient notified as instructed by telephone and verbalized understanding. 

## 2016-02-19 NOTE — Telephone Encounter (Signed)
Okay to inc.  New rx sent. Would take 20mg  a day, not 10 AM and PM.   Update me if not better. Thanks .

## 2016-02-27 ENCOUNTER — Telehealth: Payer: Self-pay

## 2016-02-27 NOTE — Telephone Encounter (Signed)
Abigail Marks at [facility] left v/m getting rejection for fluoxetine tabs; ins requires capsules; when Abigail Marks tried to run thru capsules still got denial. Abigail Marks request cb. Abigail Marks figured out problem and was able to get ins to approve tabs;nothing further needed.

## 2016-04-16 ENCOUNTER — Encounter: Payer: Self-pay | Admitting: Family Medicine

## 2016-05-12 ENCOUNTER — Encounter: Payer: Self-pay | Admitting: Family Medicine

## 2016-05-12 LAB — POTASSIUM
ALT: 23
AST: 20 U/L
CREATININE: 0.72
HEMOGLOBIN A1C: 4.7
POTASSIUM: 4 mmol/L
TSH: 2.97

## 2016-05-18 ENCOUNTER — Telehealth: Payer: Self-pay

## 2016-05-19 ENCOUNTER — Ambulatory Visit (INDEPENDENT_AMBULATORY_CARE_PROVIDER_SITE_OTHER): Payer: Managed Care, Other (non HMO) | Admitting: Primary Care

## 2016-05-19 ENCOUNTER — Encounter: Payer: Self-pay | Admitting: Primary Care

## 2016-05-19 ENCOUNTER — Ambulatory Visit: Payer: Managed Care, Other (non HMO) | Admitting: Internal Medicine

## 2016-05-19 VITALS — BP 124/74 | HR 54 | Temp 98.1°F | Ht 63.25 in | Wt 152.1 lb

## 2016-05-19 DIAGNOSIS — J069 Acute upper respiratory infection, unspecified: Secondary | ICD-10-CM

## 2016-05-19 MED ORDER — HYDROCODONE-HOMATROPINE 5-1.5 MG/5ML PO SYRP: 5.0000 mL | ORAL_SOLUTION | Freq: Every evening | ORAL | 0 refills | Status: DC | PRN

## 2016-05-19 MED ORDER — AZITHROMYCIN 250 MG PO TABS: ORAL_TABLET | ORAL | 0 refills | Status: DC

## 2016-05-19 NOTE — Progress Notes (Signed)
Pre visit review using our clinic review tool, if applicable. No additional management support is needed unless otherwise documented below in the visit note. 

## 2016-05-19 NOTE — Progress Notes (Signed)
Subjective:    Patient ID: Abigail Marks, female    DOB: 03-15-1956, 59 y.o.   MRN: [id number]  HPI   Abigail Marks is a 60 year old female who presents today with a chief complaint of cough. She also reports nasal congestion, sore throat, nausea. Her symptoms began 1 week ago with a sore throat. Since then she's developed a cough which is now causing her to lose sleep at night. She has been running low grade fevers intermittently. Her cough is now productive with green sputum since this morning. She's taken Mucinex, Nyquil Cold and Flu without much improvement. She was traveling out of state last week. Denies sore throat now, sick contacts. Overall she's feeling better, but the cough is most bothersome.  Review of Systems  Constitutional: Positive for chills, fatigue and fever.  HENT: Positive for congestion and postnasal drip. Negative for sore throat.   Gastrointestinal: Positive for nausea.       Past Medical History:  Diagnosis Date  . Anemia 03/2010  . Arthritis   . Complication of anesthesia   . Depression   . GERD (gastroesophageal reflux disease)   . Herniated lumbar intervertebral disc 10/2000   Injections  . History of hiatal hernia   . History of MRI of brain and brain stem 03/2003   Normal  . Hyperlipidemia   . Hypothyroidism   . PONV (postoperative nausea and vomiting)   . Thyroid disease    Hypothyroidism s/p radioactive ablation  . Tremor      Social History   Social History  . Marital status: Married    Spouse name: N/A  . Number of children: 2  . Years of education: N/A   Occupational History  . 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 Smoking status: Former Smoker    Quit date: 08/17/2004  . Smokeless tobacco: Not on file     Comment: Previously an occasional smoker  . Alcohol use 0.0 oz/week     Comment: Occasional  . Drug use: No  . Sexual activity: Not on file   Other Topics Concern  . 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 ABDOMINAL HYSTERECTOMY     Partial,prolapse  . BLADDER SUSPENSION  03/2005  . CATARACT EXTRACTION W/PHACO Right 11/11/2015   Procedure: CATARACT EXTRACTION PHACO AND INTRAOCULAR LENS PLACEMENT ([facility]);  Surgeon: Estill Cotta, MD;  Location: [facility] ORS;  Service: Ophthalmology;  Laterality: Right;  [place] 00:55 AP% 23.0 CDE 22.84 fluid pack lot # [id number]  . CATARACT EXTRACTION W/PHACO Left 11/01/2015   Procedure: CATARACT EXTRACTION PHACO AND INTRAOCULAR LENS PLACEMENT (I[facility]);  Surgeon: Estill Cotta, MD;  Location: [facility] ORS;  Service: Ophthalmology;  Laterality: Left;  [place]            1:05 AP%        23 CDE       28.79       fluid casette lot # [id number]  exp5/31/2018  . CHOLECYSTECTOMY    . OOPHORECTOMY     bilateral cysts    Family History  Problem Relation Age of Onset  . Diabetes Mother   . Hypertension Mother   . Cancer Mother     breast  . Heart disease Mother     MI  . Breast cancer Mother   . Cancer  Sister     Breast  . Breast cancer Sister   . Colon cancer Neg Hx     Allergies  Allergen Reactions  . Atorvastatin     REACTION: muscle pain  . Bupropion Hcl     REACTION: decreased  BP  . Ezetimibe-Simvastatin     REACTION: nausea  . Mirtazapine     REACTION: swelling  . Nsaids Other (See Comments)    gi  . Omeprazole-Sodium Bicarbonate     REACTION: GI upset  . Paroxetine     REACTION: felt bad  . Raloxifene     REACTION: leg pain  . Rosuvastatin     REACTION: increased ALT  . Temazepam     nightmares  . Trazodone And Nefazodone     nightmares  . Zolpidem Tartrate     intolerant    Current Outpatient Prescriptions on File Prior to Visit  Medication Sig Dispense Refill  . Biotin 1000 MCG tablet Take 1,000 mcg by mouth 3 (three) times daily.    . Calcium Carbonate-Vitamin D (CALCIUM 600+D HIGH POTENCY) 600-400 MG-UNIT per tablet  Take 1 or 2 capsules by mouth daily.     Marland Kitchen FLUoxetine (PROZAC) 20 MG tablet Take 1 tablet (20 mg total) by mouth daily. 90 tablet 3  . folic acid (FOLVITE) 0.5 MG tablet Take 400 mcg by mouth daily.    Marland Kitchen levothyroxine (SYNTHROID, LEVOTHROID) 112 MCG tablet Take 1 tablet (112 mcg total) by mouth daily before breakfast. Except for 1.5 tabs on Tuesdays. 105 tablet 3  . Lysine HCl 500 MG CAPS Take by mouth.    . metoprolol tartrate (LOPRESSOR) 25 MG tablet Take 0.5-1 tablets (12.5-25 mg total) by mouth 2 (two) times daily as needed (for tremor). 180 tablet 3  . Multiple Vitamin (MULTIVITAMIN) tablet Take 1 tablet by mouth daily.      . Omega-3 Fatty Acids (FISH OIL PO) Take by mouth.    . simvastatin (ZOCOR) 20 MG tablet Take 1 tablet (20 mg total) by mouth daily. 90 tablet 3  . [DISCONTINUED] Fluoxetine HCl, PMDD, 10 MG TABS Take 1 tablet (10 mg total) by mouth daily. 30 tablet 5   No current facility-administered medications on file prior to visit.     BP 124/74   Pulse (!) 54   Temp 98.1 F (36.7 C) (Oral)   Ht 5' 3.25" (1.607 m)   Wt 152 lb 1.9 oz (69 kg)   SpO2 97%   BMI 26.73 kg/m    Objective:   Physical Exam  Constitutional: She appears well-nourished.  HENT:  Right Ear: Tympanic membrane and ear canal normal.  Left Ear: Tympanic membrane and ear canal normal.  Nose: Mucosal edema present. Right sinus exhibits no maxillary sinus tenderness and no frontal sinus tenderness. Left sinus exhibits no maxillary sinus tenderness and no frontal sinus tenderness.  Mouth/Throat: Oropharynx is clear and moist.  Eyes: Conjunctivae are normal.  Neck: Neck supple.  Cardiovascular: Normal rate and regular rhythm.   Pulmonary/Chest: Effort normal and breath sounds normal. She has no wheezes. She has no rales.  Lymphadenopathy:    She has no cervical adenopathy.  Skin: Skin is warm and dry.          Assessment & Plan:  URI:  Cough, congestion, fatigue, low grade fevers x 1  week. Productive cough with green sputum this morning, is taking Mucinex. Overall no improvement in cough with OTC treatment. Exam today with clear lungs which is reassuring.  Suspect  viral involvement at this point and will treat with supportive measures. Since she is at day 7 of symptoms, requested she give another 24 hours for improvement. If no improvement then fill Zpak that was provided. Rx for Hycodan cough syrup to use HS. Delsym for day cough. Continue mucinex. Fluids, rest, follow up PRN.  Sheral Flow, NP

## 2016-05-19 NOTE — Patient Instructions (Signed)
Start Azithromycin antibiotics in 24 hours if you start to feel worse. Take 2 tablets by mouth on day 1, then 1 tablet daily for 4 additional days.  You may take the Hycodan cough suppressant at bedtime as needed for cough and rest. Caution this medication contains codeine and will make you feel drowsy.  Continue Mucinex as discussed.  It was a pleasure meeting you!   Upper Respiratory Infection, Adult Most upper respiratory infections (URIs) are a viral infection of the air passages leading to the lungs. A URI affects the nose, throat, and upper air passages. The most common type of URI is nasopharyngitis and is typically referred to as "the common cold." URIs run their course and usually go away on their own. Most of the time, a URI does not require medical attention, but sometimes a bacterial infection in the upper airways can follow a viral infection. This is called a secondary infection. Sinus and middle ear infections are common types of secondary upper respiratory infections. Bacterial pneumonia can also complicate a URI. A URI can worsen asthma and chronic obstructive pulmonary disease (COPD). Sometimes, these complications can require emergency medical care and may be life threatening.  CAUSES Almost all URIs are caused by viruses. A virus is a type of germ and can spread from one person to another.  RISKS FACTORS You may be at risk for a URI if:   You smoke.   You have chronic heart or lung disease.  You have a weakened defense (immune) system.   You are very young or very old.   You have nasal allergies or asthma.  You work in crowded or poorly ventilated areas.  You work in health care facilities or schools. SIGNS AND SYMPTOMS  Symptoms typically develop 2-3 days after you come in contact with a cold virus. Most viral URIs last 7-10 days. However, viral URIs from the influenza virus (flu virus) can last 14-18 days and are typically more severe. Symptoms may include:    Runny or stuffy (congested) nose.   Sneezing.   Cough.   Sore throat.   Headache.   Fatigue.   Fever.   Loss of appetite.   Pain in your forehead, behind your eyes, and over your cheekbones (sinus pain).  Muscle aches.  DIAGNOSIS  Your health care provider may diagnose a URI by:  Physical exam.  Tests to check that your symptoms are not due to another condition such as:  Strep throat.  Sinusitis.  Pneumonia.  Asthma. TREATMENT  A URI goes away on its own with time. It cannot be cured with medicines, but medicines may be prescribed or recommended to relieve symptoms. Medicines may help:  Reduce your fever.  Reduce your cough.  Relieve nasal congestion. HOME CARE INSTRUCTIONS   Take medicines only as directed by your health care provider.   Gargle warm saltwater or take cough drops to comfort your throat as directed by your health care provider.  Use a warm mist humidifier or inhale steam from a shower to increase air moisture. This may make it easier to breathe.  Drink enough fluid to keep your urine clear or pale yellow.   Eat soups and other clear broths and maintain good nutrition.   Rest as needed.   Return to work when your temperature has returned to normal or as your health care provider advises. You may need to stay home longer to avoid infecting others. You can also use a face mask and careful hand washing to  prevent spread of the virus.  Increase the usage of your inhaler if you have asthma.   Do not use any tobacco products, including cigarettes, chewing tobacco, or electronic cigarettes. If you need help quitting, ask your health care provider. PREVENTION  The best way to protect yourself from getting a cold is to practice good hygiene.   Avoid oral or hand contact with people with cold symptoms.   Wash your hands often if contact occurs.  There is no clear evidence that vitamin C, vitamin E, echinacea, or exercise  reduces the chance of developing a cold. However, it is always recommended to get plenty of rest, exercise, and practice good nutrition.  SEEK MEDICAL CARE IF:   You are getting worse rather than better.   Your symptoms are not controlled by medicine.   You have chills.  You have worsening shortness of breath.  You have brown or red mucus.  You have yellow or brown nasal discharge.  You have pain in your face, especially when you bend forward.  You have a fever.  You have swollen neck glands.  You have pain while swallowing.  You have white areas in the back of your throat. SEEK IMMEDIATE MEDICAL CARE IF:   You have severe or persistent:  Headache.  Ear pain.  Sinus pain.  Chest pain.  You have chronic lung disease and any of the following:  Wheezing.  Prolonged cough.  Coughing up blood.  A change in your usual mucus.  You have a stiff neck.  You have changes in your:  Vision.  Hearing.  Thinking.  Mood. MAKE SURE YOU:   Understand these instructions.  Will watch your condition.  Will get help right away if you are not doing well or get worse.   This information is not intended to replace advice given to you by your health care provider. Make sure you discuss any questions you have with your health care provider.   Document Released: 01/27/2001 Document Revised: 12/18/2014 Document Reviewed: 11/08/2013 Elsevier Interactive Patient Education [employer].

## 2016-07-21 ENCOUNTER — Other Ambulatory Visit: Payer: Self-pay | Admitting: Family Medicine

## 2016-08-25 ENCOUNTER — Other Ambulatory Visit: Payer: Self-pay | Admitting: Family Medicine

## 2016-09-13 ENCOUNTER — Other Ambulatory Visit: Payer: Self-pay | Admitting: Family Medicine

## 2016-09-15 ENCOUNTER — Other Ambulatory Visit: Payer: Self-pay | Admitting: Family Medicine

## 2016-09-15 NOTE — Telephone Encounter (Signed)
Received refill request electronically Last refill 07/21/16 #32, note sent to patient to schedule physical for further refills Last TSH 07/17/15 No upcoming appointment scheduled

## 2016-09-16 NOTE — Telephone Encounter (Signed)
Pt left v/m; pt request refill of levothyroxine; pt has enough med to last until 09/18/16.Marland Kitchen Pt request cb.

## 2016-09-16 NOTE — Telephone Encounter (Signed)
Please get her scheduled and then send.  Thanks.

## 2016-09-17 NOTE — Telephone Encounter (Signed)
Refill sent to pharmacy as instructed. Patient notified by telephone. 

## 2016-10-12 ENCOUNTER — Other Ambulatory Visit: Payer: Self-pay | Admitting: Family Medicine

## 2016-10-12 DIAGNOSIS — E039 Hypothyroidism, unspecified: Secondary | ICD-10-CM

## 2016-10-12 DIAGNOSIS — E785 Hyperlipidemia, unspecified: Secondary | ICD-10-CM

## 2016-10-12 DIAGNOSIS — D649 Anemia, unspecified: Secondary | ICD-10-CM

## 2016-10-13 ENCOUNTER — Other Ambulatory Visit: Payer: Self-pay | Admitting: Family Medicine

## 2016-10-14 ENCOUNTER — Other Ambulatory Visit (INDEPENDENT_AMBULATORY_CARE_PROVIDER_SITE_OTHER): Payer: Managed Care, Other (non HMO)

## 2016-10-14 DIAGNOSIS — D649 Anemia, unspecified: Secondary | ICD-10-CM

## 2016-10-14 DIAGNOSIS — E785 Hyperlipidemia, unspecified: Secondary | ICD-10-CM

## 2016-10-14 DIAGNOSIS — R7989 Other specified abnormal findings of blood chemistry: Secondary | ICD-10-CM

## 2016-10-14 DIAGNOSIS — E039 Hypothyroidism, unspecified: Secondary | ICD-10-CM | POA: Diagnosis not present

## 2016-10-14 LAB — LIPID PANEL
CHOL/HDL RATIO: 4
Cholesterol: 203 mg/dL — ABNORMAL HIGH (ref 0–200)
HDL: 51.2 mg/dL (ref >39.00)
NonHDL: 151.36
Triglycerides: 243 mg/dL — ABNORMAL HIGH (ref 0.0–149.0)
VLDL: 48.6 mg/dL — AB (ref 0.0–40.0)

## 2016-10-14 LAB — CBC WITH DIFFERENTIAL/PLATELET
Basophils Absolute: 0 10*3/uL (ref 0.0–0.1)
Basophils Relative: 0.5 % (ref 0.0–3.0)
EOS ABS: 0.1 10*3/uL (ref 0.0–0.7)
Eosinophils Relative: 2.1 % (ref 0.0–5.0)
HCT: 36.6 % (ref 36.0–46.0)
HEMOGLOBIN: 12.2 g/dL (ref 12.0–15.0)
Lymphocytes Relative: 36.1 % (ref 12.0–46.0)
Lymphs Abs: 2.5 10*3/uL (ref 0.7–4.0)
MCHC: 33.4 g/dL (ref 30.0–36.0)
MCV: 95.4 fl (ref 78.0–100.0)
MONO ABS: 0.6 10*3/uL (ref 0.1–1.0)
Monocytes Relative: 8.9 % (ref 3.0–12.0)
Neutro Abs: 3.6 10*3/uL (ref 1.4–7.7)
Neutrophils Relative %: 52.4 % (ref 43.0–77.0)
Platelets: 243 10*3/uL (ref 150.0–400.0)
RBC: 3.84 Mil/uL — AB (ref 3.87–5.11)
RDW: 12.6 % (ref 11.5–15.5)
WBC: 6.9 10*3/uL (ref 4.0–10.5)

## 2016-10-14 LAB — COMPREHENSIVE METABOLIC PANEL
ALK PHOS: 63 U/L (ref 39–117)
ALT: 26 U/L (ref 0–35)
AST: 20 U/L (ref 0–37)
Albumin: 4.2 g/dL (ref 3.5–5.2)
BUN: 13 mg/dL (ref 6–23)
CHLORIDE: 105 meq/L (ref 96–112)
CO2: 27 mEq/L (ref 19–32)
Calcium: 9.1 mg/dL (ref 8.4–10.5)
Creatinine, Ser: 0.82 mg/dL (ref 0.40–1.20)
GFR: 75.04 mL/min (ref >60.00)
Glucose, Bld: 148 mg/dL — ABNORMAL HIGH (ref 70–99)
POTASSIUM: 4.4 meq/L (ref 3.5–5.1)
Sodium: 140 mEq/L (ref 135–145)
TOTAL PROTEIN: 7 g/dL (ref 6.0–8.3)
Total Bilirubin: 0.4 mg/dL (ref 0.2–1.2)

## 2016-10-14 LAB — TSH: TSH: 9.35 u[IU]/mL — ABNORMAL HIGH (ref 0.35–4.50)

## 2016-10-14 LAB — LDL CHOLESTEROL, DIRECT: LDL DIRECT: 117 mg/dL

## 2016-10-19 ENCOUNTER — Ambulatory Visit (INDEPENDENT_AMBULATORY_CARE_PROVIDER_SITE_OTHER): Payer: Managed Care, Other (non HMO) | Admitting: Family Medicine

## 2016-10-19 ENCOUNTER — Encounter: Payer: Self-pay | Admitting: Family Medicine

## 2016-10-19 VITALS — BP 120/62 | HR 66 | Temp 98.3°F | Ht 63.0 in | Wt 154.5 lb

## 2016-10-19 DIAGNOSIS — E785 Hyperlipidemia, unspecified: Secondary | ICD-10-CM

## 2016-10-19 DIAGNOSIS — R739 Hyperglycemia, unspecified: Secondary | ICD-10-CM

## 2016-10-19 DIAGNOSIS — Z0000 Encounter for general adult medical examination without abnormal findings: Secondary | ICD-10-CM | POA: Diagnosis not present

## 2016-10-19 DIAGNOSIS — E039 Hypothyroidism, unspecified: Secondary | ICD-10-CM

## 2016-10-19 DIAGNOSIS — F32A Depression, unspecified: Secondary | ICD-10-CM

## 2016-10-19 DIAGNOSIS — K589 Irritable bowel syndrome without diarrhea: Secondary | ICD-10-CM

## 2016-10-19 DIAGNOSIS — F329 Major depressive disorder, single episode, unspecified: Secondary | ICD-10-CM

## 2016-10-19 DIAGNOSIS — R251 Tremor, unspecified: Secondary | ICD-10-CM

## 2016-10-19 LAB — HEMOGLOBIN A1C: Hgb A1c MFr Bld: 5.2 % (ref 4.6–6.5)

## 2016-10-19 MED ORDER — FLUOXETINE HCL 20 MG PO TABS: 20.0000 mg | ORAL_TABLET | Freq: Every day | ORAL | 3 refills | Status: DC

## 2016-10-19 MED ORDER — METOPROLOL TARTRATE 25 MG PO TABS: 12.5000 mg | ORAL_TABLET | Freq: Two times a day (BID) | ORAL | 3 refills | Status: DC | PRN

## 2016-10-19 MED ORDER — LEVOTHYROXINE SODIUM 137 MCG PO TABS: 137.0000 ug | ORAL_TABLET | Freq: Every day | ORAL | 3 refills | Status: DC

## 2016-10-19 MED ORDER — LOPERAMIDE HCL 2 MG PO TABS: 2.0000 mg | ORAL_TABLET | Freq: Every day | ORAL | Status: DC | PRN

## 2016-10-19 MED ORDER — SIMVASTATIN 20 MG PO TABS: 20.0000 mg | ORAL_TABLET | Freq: Every day | ORAL | 3 refills | Status: DC

## 2016-10-19 NOTE — Progress Notes (Signed)
Pre visit review using our clinic review tool, if applicable. No additional management support is needed unless otherwise documented below in the visit note. 

## 2016-10-19 NOTE — Assessment & Plan Note (Signed)
Labs d/w pt.  A1c pending.  D/w pt about diet and exercise.

## 2016-10-19 NOTE — Assessment & Plan Note (Signed)
Continue prn BB.  Update me as needed.

## 2016-10-19 NOTE — Assessment & Plan Note (Signed)
Pap per gyn, Dr. Gertie Fey in [place] [facility] DXA done per gyn Mammogram done 2017 per gyn. Up to date.  Living will d/w pt. Abigail Marks would be designated if patient were incapacitated.  Tetanus 2014 Flu shot due fall 2018 PNA not due yet.  Shingles done 2017 at pharmacy.  Diet and exercise d/w pt. Encouraged both. "I'm not doing well with the weight." she is working on diet but exercise is limited.   HIV done prev at GYN clinic. HCV screening prev done.

## 2016-10-19 NOTE — Assessment & Plan Note (Signed)
Okay for imodium trial, daily dosing, if not better, then we can refer to GI.

## 2016-10-19 NOTE — Assessment & Plan Note (Signed)
Inc replacement to 186mcg a day, recheck TSH in about 2 months.  D/w pt.  No tmg on exam.

## 2016-10-19 NOTE — Assessment & Plan Note (Signed)
Likely controlled with some sx from hypothyroidism.  Address TSH and she'll update me as needed.

## 2016-10-19 NOTE — Patient Instructions (Addendum)
Go to the lab on the way out.  We'll contact you with your lab report. A1c today, needs TSH in 2 months.  Nonfasting lab visit.  Take care.  Glad to see you.  Use the eat right diet in the meantime.  Let me know if you aren't feeling better.  Try taking imodium daily and if not better we can set you up with GI.

## 2016-10-19 NOTE — Progress Notes (Signed)
CPE- See plan.  Routine anticipatory guidance given to patient.  See health maintenance. Pap per gyn, Dr. Gertie Fey in [place] [facility] DXA done per gyn Mammogram done 2017 per gyn. Up to date.  Living will d/w pt. Abigail Marks would be designated if patient were incapacitated.  Tetanus 2014 Flu shot due fall 2018 PNA not due yet.  Shingles done 2017 at pharmacy.  Diet and exercise d/w pt. Encouraged both. "I'm not doing well with the weight." she is working on diet but exercise is limited.   HIV done prev at GYN clinic. HCV screening prev done.    She has frequent watery diarrhea.  It has been going on long term.  She thought she had IBS.  No blood in stool.  Up to date on colonoscopy.  Going on for years.  Tried bentyl, w/o relief prev.  Imodium helps some.    Mood is okay but TSH up and she may have some dec in motivation from hypothyroidism.  She does enjoy activity when she eventually gets out.    Elevated Cholesterol: Using medications without problems:yes Muscle aches: no Diet compliance:see above Exercise: see above.  Labs d/w pt.    Hypothyroidism.  No neck mass, lumps.  No dysphagia.  Compliant.  Labs d/w pt.  Some fatigue.    Hyperglycemia.  Labs d/w pt.  A1c pending.    Still with variable tremor, treated with PRN BB w/o ADE.  The tremor isn't getting slowly worse over the years, worse under higher stress, but not bothersome enough to do anything else about.    PMH and SH reviewed  Meds, vitals, and allergies reviewed.   ROS: Per HPI.  Unless specifically indicated otherwise in HPI, the patient denies:  General: fever. Eyes: acute vision changes ENT: sore throat Cardiovascular: chest pain Respiratory: SOB GI: vomiting GU: dysuria Musculoskeletal: acute back pain Derm: acute rash Neuro: acute motor dysfunction Psych: worsening mood Endocrine: polydipsia Heme: bleeding Allergy: hayfever  GEN: nad, alert and oriented HEENT: mucous  membranes moist NECK: supple w/o LA CV: rrr. PULM: ctab, no inc wob ABD: soft, +bs EXT: no edema SKIN: no acute rash

## 2016-10-19 NOTE — Assessment & Plan Note (Addendum)
TG up, d/w pt about diet and exercise and labs.  Continue statin.

## 2016-10-22 ENCOUNTER — Encounter: Payer: Self-pay | Admitting: *Deleted

## 2016-12-15 ENCOUNTER — Encounter: Payer: Self-pay | Admitting: Family Medicine

## 2016-12-18 ENCOUNTER — Other Ambulatory Visit (INDEPENDENT_AMBULATORY_CARE_PROVIDER_SITE_OTHER): Payer: Managed Care, Other (non HMO)

## 2016-12-18 DIAGNOSIS — E039 Hypothyroidism, unspecified: Secondary | ICD-10-CM

## 2016-12-18 LAB — TSH: TSH: 5.16 u[IU]/mL — AB (ref 0.35–4.50)

## 2016-12-20 ENCOUNTER — Other Ambulatory Visit: Payer: Self-pay | Admitting: Family Medicine

## 2016-12-20 DIAGNOSIS — E039 Hypothyroidism, unspecified: Secondary | ICD-10-CM

## 2016-12-20 MED ORDER — LEVOTHYROXINE SODIUM 150 MCG PO TABS: 150.0000 ug | ORAL_TABLET | Freq: Every day | ORAL | 3 refills | Status: DC

## 2016-12-21 ENCOUNTER — Encounter: Payer: Self-pay | Admitting: Family Medicine

## 2016-12-31 NOTE — Telephone Encounter (Signed)
Error

## 2017-01-27 ENCOUNTER — Ambulatory Visit (INDEPENDENT_AMBULATORY_CARE_PROVIDER_SITE_OTHER): Payer: Managed Care, Other (non HMO) | Admitting: Family Medicine

## 2017-01-27 ENCOUNTER — Encounter: Payer: Self-pay | Admitting: Family Medicine

## 2017-01-27 ENCOUNTER — Ambulatory Visit: Payer: Managed Care, Other (non HMO) | Admitting: Family Medicine

## 2017-01-27 DIAGNOSIS — R42 Dizziness and giddiness: Secondary | ICD-10-CM | POA: Insufficient documentation

## 2017-01-27 MED ORDER — MECLIZINE HCL 25 MG PO TABS: 25.0000 mg | ORAL_TABLET | Freq: Three times a day (TID) | ORAL | 0 refills | Status: DC | PRN

## 2017-01-27 NOTE — Patient Instructions (Signed)
Great to meet you.  Please stop by to see Abigail Marks on your way out.  Try meclizine as needed.

## 2017-01-27 NOTE — Progress Notes (Signed)
Pre visit review using our clinic review tool, if applicable. No additional management support is needed unless otherwise documented below in the visit note. 

## 2017-01-27 NOTE — Progress Notes (Signed)
Subjective:   Patient ID: Abigail Marks, female    DOB: 1955/09/30, 61 y.o.   MRN: [id number]  Abigail Marks is a pleasant 61 y.o. year old female patient of Dr. Damita Dunnings, new to me, who presents to clinic today with Dizziness (for a minth now, got worse within the last 2 days )  on 01/27/2017  HPI:  Intermittent dizziness for past month or two but feels it is getting progressively worse- "last two days have been the worst."  Dizziness does not seem to occur with changes in head position.  She is also having frequent headaches.  No blurred vision, slurred speech.  Has had some nausea and gait instability with these symptoms.  Has not tried anything other than Benadryl for these symptoms and not sure if it has helped too much.  Current Outpatient Prescriptions on File Prior to Visit  Medication Sig Dispense Refill  . Biotin 1000 MCG tablet Take 1,000 mcg by mouth 3 (three) times daily.    . Calcium Carbonate-Vitamin D (CALCIUM 600+D HIGH POTENCY) 600-400 MG-UNIT per tablet Take 1 or 2 capsules by mouth daily.     Marland Kitchen FLUoxetine (PROZAC) 20 MG tablet Take 1 tablet (20 mg total) by mouth daily. 90 tablet 3  . folic acid (FOLVITE) 0.5 MG tablet Take 400 mcg by mouth daily.    Marland Kitchen levothyroxine (SYNTHROID, LEVOTHROID) 150 MCG tablet Take 1 tablet (150 mcg total) by mouth daily before breakfast. 90 tablet 3  . loperamide (IMODIUM A-D) 2 MG tablet Take 1 tablet (2 mg total) by mouth daily as needed for diarrhea or loose stools.    . Lysine HCl 500 MG CAPS Take by mouth.    . metoprolol tartrate (LOPRESSOR) 25 MG tablet Take 0.5-1 tablets (12.5-25 mg total) by mouth 2 (two) times daily as needed (for tremor). 180 tablet 3  . Multiple Vitamin (MULTIVITAMIN) tablet Take 1 tablet by mouth daily.      . Omega-3 Fatty Acids (FISH OIL PO) Take by mouth.    . simvastatin (ZOCOR) 20 MG tablet Take 1 tablet (20 mg total) by mouth daily. 90 tablet 3  . [DISCONTINUED] Fluoxetine HCl, PMDD, 10 MG TABS  Take 1 tablet (10 mg total) by mouth daily. 30 tablet 5   No current facility-administered medications on file prior to visit.     Allergies  Allergen Reactions  . Atorvastatin     REACTION: muscle pain  . Bupropion Hcl     REACTION: decreased  BP  . Ezetimibe-Simvastatin     REACTION: nausea  . Mirtazapine     REACTION: swelling  . Nsaids Other (See Comments)    gi  . Omeprazole-Sodium Bicarbonate     REACTION: GI upset  . Paroxetine     REACTION: felt bad  . Raloxifene     REACTION: leg pain  . Rosuvastatin     REACTION: increased ALT  . Temazepam     nightmares  . Trazodone And Nefazodone     nightmares  . Zolpidem Tartrate     intolerant    Past Medical History:  Diagnosis Date  . Anemia 03/2010  . Arthritis   . Complication of anesthesia   . Depression   . GERD (gastroesophageal reflux disease)   . Herniated lumbar intervertebral disc 10/2000   Injections  . History of hiatal hernia   . History of MRI of brain and brain stem 03/2003   Normal  . Hyperlipidemia   . Hypothyroidism   .  PONV (postoperative nausea and vomiting)   . Thyroid disease    Hypothyroidism s/p radioactive ablation  . Tremor     Past Surgical History:  Procedure Laterality Date  . ABDOMINAL HYSTERECTOMY     Partial,prolapse  . BLADDER SUSPENSION  03/2005  . CATARACT EXTRACTION W/PHACO Right 11/11/2015   Procedure: CATARACT EXTRACTION PHACO AND INTRAOCULAR LENS PLACEMENT (IOC);  Surgeon: Estill Cotta, MD;  Location: [facility] ORS;  Service: Ophthalmology;  Laterality: Right;  [place] 00:55 AP% 23.0 CDE 22.84 fluid pack lot # [id number]  . CATARACT EXTRACTION W/PHACO Left 11/01/2015   Procedure: CATARACT EXTRACTION PHACO AND INTRAOCULAR LENS PLACEMENT (IOC);  Surgeon: Estill Cotta, MD;  Location: [facility] ORS;  Service: Ophthalmology;  Laterality: Left;  [place]            1:05 AP%        23 CDE       28.79       fluid casette lot # [id number]  exp5/31/2018  . CHOLECYSTECTOMY    . OOPHORECTOMY       bilateral cysts    Family History  Problem Relation Age of Onset  . Diabetes Mother   . Hypertension Mother   . Cancer Mother        breast  . Heart disease Mother        MI  . Breast cancer Mother   . Cancer Sister        Breast  . Breast cancer Sister   . Colon cancer Neg Hx     Social History   Social History  . Marital status: Married    Spouse name: N/A  . Number of children: 2  . Years of education: N/A   Occupational History  . 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 Smoking status: Former Smoker    Quit date: 08/17/2004  . Smokeless tobacco: Never Used     Comment: Previously an occasional smoker  . Alcohol use 0.0 oz/week     Comment: Occasional  . Drug use: No  . Sexual activity: Not on file   Other Topics Concern  . Not on file   Social History Narrative   From [place] for H[facility]   [employer], [employer], Social History, Family History, Medications, and allergies have been reviewed in [facility], and have been updated if relevant.   Review of Systems  Eyes: Negative.   Respiratory: Negative.   Cardiovascular: Negative.   Gastrointestinal: Positive for nausea. Negative for vomiting.  Genitourinary: Negative.   Musculoskeletal: Negative.   Neurological: Positive for dizziness and headaches. Negative for tremors, seizures, syncope, facial asymmetry, speech difficulty, weakness, light-headedness and numbness.  Psychiatric/Behavioral: Negative for confusion.  All other systems reviewed and are negative.      Objective:    BP 108/80   Pulse (!) 52   Temp 98.2 F (36.8 C)   Wt 157 lb (71.2 kg)   SpO2 96%   BMI 27.81 kg/m    Physical Exam  Constitutional: She is oriented to person, place, and time. She appears well-developed and well-nourished. No distress.  HENT:  Head: Normocephalic and atraumatic.  ? Slight right nystagmus with dix hallpike  Eyes: Conjunctivae are normal.  Cardiovascular: Normal  rate.   Pulmonary/Chest: Effort normal.  Musculoskeletal: Normal range of motion.  Neurological: She is alert and oriented to person, place, and time. No cranial nerve deficit.  Skin: Skin is warm and dry. She is  not diaphoretic.  Psychiatric: She has a normal mood and affect. Her behavior is normal. Judgment and thought content normal.  Nursing note and vitals reviewed.         Assessment & Plan:   Dizziness - Plan: CT Head Wo Contrast No Follow-up on file.

## 2017-01-27 NOTE — Assessment & Plan Note (Signed)
New- progressive. Not classic vertigo but did have some nystagmus on exam. eRx sent for meclizine. Given atypical nature of symptoms and progression of symptoms, will order head CT. If symptoms persistent, consider ENT referral vs vestibular rehab. The patient indicates understanding of these issues and agrees with the plan.

## 2017-02-02 ENCOUNTER — Ambulatory Visit
Admission: RE | Admit: 2017-02-02 | Discharge: 2017-02-02 | Disposition: A | Discharge status: Home / Self Care | Payer: Managed Care, Other (non HMO) | Source: Ambulatory Visit | Attending: Family Medicine | Admitting: Family Medicine

## 2017-02-02 ENCOUNTER — Other Ambulatory Visit: Payer: Self-pay | Admitting: Family Medicine

## 2017-02-02 DIAGNOSIS — R42 Dizziness and giddiness: Secondary | ICD-10-CM | POA: Diagnosis not present

## 2017-02-23 ENCOUNTER — Other Ambulatory Visit (INDEPENDENT_AMBULATORY_CARE_PROVIDER_SITE_OTHER): Payer: Managed Care, Other (non HMO)

## 2017-02-23 DIAGNOSIS — E039 Hypothyroidism, unspecified: Secondary | ICD-10-CM

## 2017-02-23 LAB — TSH: TSH: 0.09 u[IU]/mL — AB (ref 0.35–4.50)

## 2017-02-25 ENCOUNTER — Other Ambulatory Visit: Payer: Self-pay | Admitting: Family Medicine

## 2017-02-25 DIAGNOSIS — E039 Hypothyroidism, unspecified: Secondary | ICD-10-CM

## 2017-02-25 MED ORDER — LEVOTHYROXINE SODIUM 150 MCG PO TABS: 150.0000 ug | ORAL_TABLET | Freq: Every day | ORAL | 3 refills | Status: DC

## 2017-03-02 ENCOUNTER — Encounter: Payer: Self-pay | Admitting: Diagnostic Neuroimaging

## 2017-03-02 ENCOUNTER — Ambulatory Visit (INDEPENDENT_AMBULATORY_CARE_PROVIDER_SITE_OTHER): Payer: Managed Care, Other (non HMO) | Admitting: Diagnostic Neuroimaging

## 2017-03-02 VITALS — BP 136/66 | HR 60 | Ht 63.0 in | Wt 157.8 lb

## 2017-03-02 DIAGNOSIS — G250 Essential tremor: Secondary | ICD-10-CM

## 2017-03-02 DIAGNOSIS — R42 Dizziness and giddiness: Secondary | ICD-10-CM | POA: Diagnosis not present

## 2017-03-02 NOTE — Progress Notes (Signed)
[facility]  PATIENT: AMIREE NO DOB: 06/18/1956  REFERRING CLINICIAN: Bjorn Loser, MD HISTORY FROM: patient  REASON FOR VISIT: new consult    HISTORICAL  CHIEF COMPLAINT:  Chief Complaint  Patient presents with  . NP Arnette Norris, MD  . Dizziness    started about 2 mo ago, transient light headedness, lights/ noise, nausea.      HISTORY OF PRESENT ILLNESS:   61 year old right-handed female here for evaluation of lightheadedness, dizziness, vertigo, tremors.  For past 2 months patient has had intermittent lightheadedness, balance difficulty, nausea, dizziness sensation lasting for a few seconds a time, mainly when she stands up to walk, turns, gets up from seated position. Turning her head does not seem to activate the symptoms. Typically a change in her position or movement does activate symptom. Patient has 2-3 episodes per day. One time she almost fell into a wall due to her imbalance. Patient referred here for evaluation of the symptoms.  In addition patient has long history of "benign essential tremor" with tremors in her hands, postural or action. Patient has family history of tremor in her mother, brother, sister.  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    REVIEW OF SYSTEMS: Full 14 system review of systems performed and negative with exception of: Ringing in ears spinning sensation dizziness tremor depression anxiety not asleep decreased energy restless legs.   ALLERGIES: Allergies  Allergen Reactions  . Atorvastatin     REACTION: muscle pain  . Bupropion Hcl     REACTION: decreased  BP  . Ezetimibe-Simvastatin     REACTION: nausea  . Mirtazapine     REACTION: swelling  . Nsaids Other (See Comments)    gi  . Omeprazole-Sodium Bicarbonate     REACTION: GI upset  . Paroxetine     REACTION: felt bad  . Raloxifene     REACTION: leg pain  . Rosuvastatin     REACTION: increased ALT  . Temazepam     nightmares  .  Trazodone And Nefazodone     nightmares  . Zolpidem Tartrate     intolerant    HOME MEDICATIONS: Outpatient Medications Prior to Visit  Medication Sig Dispense Refill  . Biotin 1000 MCG tablet Take 1,000 mcg by mouth 3 (three) times daily.    . Calcium Carbonate-Vitamin D (CALCIUM 600+D HIGH POTENCY) 600-400 MG-UNIT per tablet Take 1 or 2 capsules by mouth daily.     Marland Kitchen FLUoxetine (PROZAC) 20 MG tablet Take 1 tablet (20 mg total) by mouth daily. 90 tablet 3  . folic acid (FOLVITE) 0.5 MG tablet Take 400 mcg by mouth daily.    Marland Kitchen levothyroxine (SYNTHROID, LEVOTHROID) 150 MCG tablet Take 1 tablet (150 mcg total) by mouth daily before breakfast. Except for 0.5 tab on Sundays.  Total of 6.5 tabs in a week. 90 tablet 3  . loperamide (IMODIUM A-D) 2 MG tablet Take 1 tablet (2 mg total) by mouth daily as needed for diarrhea or loose stools.    . Lysine HCl 500 MG CAPS Take by mouth.    . meclizine (ANTIVERT) 25 MG tablet Take 1 tablet (25 mg total) by mouth 3 (three) times daily as needed for dizziness. 30 tablet 0  . metoprolol tartrate (LOPRESSOR) 25 MG tablet Take 0.5-1 tablets (12.5-25 mg total) by mouth 2 (two) times daily as needed (for tremor). 180 tablet 3  . Multiple Vitamin (MULTIVITAMIN) tablet Take 1 tablet by mouth daily.      Marland Kitchen  Omega-3 Fatty Acids (FISH OIL PO) Take by mouth.    . simvastatin (ZOCOR) 20 MG tablet Take 1 tablet (20 mg total) by mouth daily. 90 tablet 3   No facility-administered medications prior to visit.     PAST MEDICAL HISTORY: Past Medical History:  Diagnosis Date  . Anemia 03/2010  . Arthritis   . Complication of anesthesia   . Depression   . GERD (gastroesophageal reflux disease)   . Herniated lumbar intervertebral disc 10/2000   Injections  . History of hiatal hernia   . History of MRI of brain and brain stem 03/2003   Normal  . Hyperlipidemia   . Hypothyroidism   . PONV (postoperative nausea and vomiting)   . Thyroid disease    Hypothyroidism s/p  radioactive ablation  . Tremor     PAST SURGICAL HISTORY: Past Surgical History:  Procedure Laterality Date  . ABDOMINAL HYSTERECTOMY     Partial,prolapse  . BLADDER SUSPENSION  03/2005  . CATARACT EXTRACTION W/PHACO Right 11/11/2015   Procedure: CATARACT EXTRACTION PHACO AND INTRAOCULAR LENS PLACEMENT (IOC);  Surgeon: Estill Cotta, MD;  Location: [facility] ORS;  Service: Ophthalmology;  Laterality: Right;  [place] 00:55 AP% 23.0 CDE 22.84 fluid pack lot # [id number]  . CATARACT EXTRACTION W/PHACO Left 11/01/2015   Procedure: CATARACT EXTRACTION PHACO AND INTRAOCULAR LENS PLACEMENT (IOC);  Surgeon: Estill Cotta, MD;  Location: [facility] ORS;  Service: Ophthalmology;  Laterality: Left;  [place]            1:05 AP%        23 CDE       28.79       fluid casette lot # [id number]  exp5/31/2018  . CHOLECYSTECTOMY    . OOPHORECTOMY     bilateral cysts    FAMILY HISTORY: Family History  Problem Relation Age of Onset  . Diabetes Mother   . Hypertension Mother   . Cancer Mother        breast  . Heart disease Mother        MI  . Breast cancer Mother   . Cancer Sister        Breast  . Breast cancer Sister   . Colon cancer Neg Hx     SOCIAL HISTORY:  Social History   Social History  . Marital status: Married    Spouse name: N/A  . Number of children: 2  . Years of education: N/A   Occupational History  . 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 Smoking status: Former Smoker    Quit date: 08/17/2004  . Smokeless tobacco: Never Used     Comment: Previously an occasional smoker  . Alcohol use 0.0 oz/week     Comment: Occasional  . Drug use: No  . Sexual activity: Not on file   Other Topics Concern  . 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  Visual Acuity Screening   Right eye Left eye Both eyes  Without correction: 20/30 20/30   With correction:        Patient is in no distress; well developed, nourished and groomed; neck  is supple  CARDIOVASCULAR:  Examination of carotid arteries is normal; no carotid bruits  Regular rate and rhythm, no murmurs  Examination of peripheral vascular system by observation and palpation is normal  EYES:  Ophthalmoscopic exam of optic discs and posterior segments is normal; no papilledema or hemorrhages  MUSCULOSKELETAL:  Gait, strength, tone, movements noted in Neurologic exam below  NEUROLOGIC: MENTAL STATUS:  No flowsheet data found.  awake, alert, oriented to person, place and time  recent and remote memory intact  normal attention and concentration  language fluent, comprehension intact, naming intact,   fund of knowledge appropriate  CRANIAL NERVE:   2nd - no papilledema on fundoscopic exam  2nd, 3rd, 4th, 6th - pupils equal and reactive to light, visual fields full to confrontation, extraocular muscles intact, no nystagmus  5th - facial sensation symmetric  7th - facial strength symmetric  8th - hearing intact  9th - palate elevates symmetrically, uvula midline  11th - shoulder shrug symmetric  12th - tongue protrusion midline  DIX-HALLPIKE TESTING --> WITH HEAD TURNED LEFT, LAY DOWN AND SIT UP TRIGGERED SYMPTOMS, BUT NO NYSTAGMUS. HEAD TURNED RIGHT ALSO TRIGGERED SYMPTOMS, BUT MUCH MORE MILD  MOTOR:   normal bulk and tone, full strength in the BUE, BLE  MILD POSTURAL TREMOR (RIGHT > LEFT)  MILD ACTION TREMOR WITH ARCHIMEDES SPIRAL (LEFT > RIGHT)  SENSORY:   normal and symmetric to light touch, temperature, vibration  COORDINATION:   finger-nose-finger, fine finger movements normal  REFLEXES:   deep tendon reflexes present and symmetric  GAIT/STATION:   narrow based gait; able to walk tandem;  romberg is negative    DIAGNOSTIC DATA (LABS, IMAGING, TESTING) - I reviewed patient records, labs, notes, testing and imaging myself where available.  Lab Results  Component Value Date   WBC 6.9 10/14/2016   HGB 12.2 10/14/2016   HCT 36.6 10/14/2016   MCV 95.4 10/14/2016   PLT 243.0 10/14/2016      Component Value Date/Time   NA 140 10/14/2016 0806   K 4.4 10/14/2016 0806   K 4.0 04/28/2016   CL 105 10/14/2016 0806   CO2 27 10/14/2016 0806   GLUCOSE 148 (H) 10/14/2016 0806   BUN 13 10/14/2016 0806   CREATININE 0.82 10/14/2016 0806   CREATININE 0.72 04/28/2016   CREATININE 0.76 03/07/2014   CALCIUM 9.1 10/14/2016 0806   PROT 7.0 10/14/2016 0806   ALBUMIN 4.2 10/14/2016 0806   AST 20 10/14/2016 0806   AST 20 04/28/2016   ALT 26 10/14/2016 0806   ALT 23 04/28/2016   ALT 26 03/07/2014   ALKPHOS 63 10/14/2016 0806   BILITOT 0.4 10/14/2016 0806   GFRNONAA 92.50 02/21/2009 0828   Lab Results  Component Value Date   CHOL 203 (H) 10/14/2016   HDL 51.20 10/14/2016   LDLCALC 108 (H) 07/17/2015   LDLDIRECT 117.0 10/14/2016   TRIG 243.0 (H) 10/14/2016   CHOLHDL 4 10/14/2016   Lab Results  Component Value Date   HGBA1C 5.2 10/19/2016   Lab Results  Component Value Date   VITAMINB12 604 03/31/2010   Lab Results  Component Value Date   TSH 0.09 (L) 02/23/2017    02/02/17 CT head [I reviewed images myself and agree with interpretation. -VRP]  - No intracranial hemorrhage or CT evidence of large acute infarct. - No intracranial mass lesion noted on this unenhanced exam. - Partially empty slightly expanded sella without secondary findings of pseudotumor. - Minimal mucosal thickening posterior left sphenoid sinus air  cell.    ASSESSMENT AND PLAN  61 y.o. year old female here with INTERMITTENT positional dizziness since May 2018.    Ddx dizziness: peripheral vestibulopathy (BPV, Mnire's, labyrinthitis) vs central vestibulopathy (CNS autoimmune/inflamm,  demyelinating, migraine variant)  1. Dizziness   2. 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    Penni Bombard, MD 0/17/4944, 96:75 AM Certified in Neurology, Neurophysiology and Neuroimaging  [facility] [address] [place], [place] [address] [phone]

## 2017-03-02 NOTE — Patient Instructions (Signed)
Thank you for coming to see [place] at [facility]. I hope we have been able to provide you high quality care today.  You may receive a patient satisfaction survey over the next few weeks. We would appreciate your feedback and comments so that we may continue to improve ourselves and the health of our patients.  - monitor symptoms  - if symptoms worsen, then may consider MRI brain  - may try weaning off meclizine    ~~~~~~~~~~~~~~~~~~~~~~~~~~~~~~~~~~~~~~~~~~~~~~~~~~~~~~~~~~~~~~~~~  DR. Carley Strickling'S GUIDE TO HAPPY AND HEALTHY LIVING These are some of my general health and wellness recommendations. Some of them may apply to you better than others. Please use common sense as you try these suggestions and feel free to ask me any questions.   ACTIVITY/FITNESS Mental, social, emotional and physical stimulation are very important for brain and body health. Try learning a new activity (arts, music, language, sports, games).  Keep moving your body to the best of your abilities. You can do this at home, inside or outside, the park, community center, gym or anywhere you like. Consider a physical therapist or personal trainer to get started. Consider the app Sworkit. Fitness trackers such as smart-watches, smart-phones or Fitbits can help as well.   NUTRITION Eat more plants: colorful vegetables, nuts, seeds and berries.  Eat less sugar, salt, preservatives and processed foods.  Avoid toxins such as cigarettes and alcohol.  Drink water when you are thirsty. Warm water with a slice of lemon is an excellent morning drink to start the day.  Consider these websites for more information The Nutrition Source (https://www.henry-hernandez.biz/) Precision Nutrition (WindowBlog.ch)   RELAXATION Consider practicing mindfulness meditation or other relaxation techniques such as deep breathing, prayer, yoga, tai chi, massage. See website  mindful.org or the apps Headspace or Calm to help get started.   SLEEP Try to get at least 7-8+ hours sleep per day. Regular exercise and reduced caffeine will help you sleep better. Practice good sleep hygeine techniques. See website sleep.org for more information.   PLANNING Prepare estate planning, living will, healthcare POA documents. Sometimes this is best planned with the help of an attorney. Theconversationproject.org and agingwithdignity.org are excellent resources.

## 2017-05-24 ENCOUNTER — Other Ambulatory Visit (INDEPENDENT_AMBULATORY_CARE_PROVIDER_SITE_OTHER): Payer: Managed Care, Other (non HMO)

## 2017-05-24 DIAGNOSIS — E039 Hypothyroidism, unspecified: Secondary | ICD-10-CM

## 2017-05-24 LAB — TSH: TSH: 0.53 u[IU]/mL (ref 0.35–4.50)

## 2017-08-23 ENCOUNTER — Other Ambulatory Visit: Payer: Self-pay | Admitting: Family Medicine

## 2017-09-09 ENCOUNTER — Encounter: Payer: Self-pay | Admitting: Family Medicine

## 2017-09-09 ENCOUNTER — Ambulatory Visit: Payer: Managed Care, Other (non HMO) | Admitting: Family Medicine

## 2017-09-09 DIAGNOSIS — R42 Dizziness and giddiness: Secondary | ICD-10-CM | POA: Diagnosis not present

## 2017-09-09 MED ORDER — MECLIZINE HCL 25 MG PO TABS: 12.5000 mg | ORAL_TABLET | Freq: Three times a day (TID) | ORAL | 0 refills | Status: DC | PRN

## 2017-09-09 NOTE — Progress Notes (Signed)
H/o vertigo in the past.  Had seen neurology in the past.  Had used meclizine in distant past, months ago.  She was getting some better, only having episodic sx but not daily sx.    The she slipped and fell in 06/2017, she hit her head on the ground after slipping on some steps.  No LOC.  At that point, the vertigo returned, worse in the last few weeks.  Ringing in the ears.  No phonophobia but photophobia with eye exam today.  Room spins, esp with laying down.  Rooms spins counterclockwise.  No sx with eye tracking but does have sx with head movement.  No syncope.  Some nausea, episodically.  No other falls.    Tremor is worse in the meantime.  No focal weakness.  No hearing loss but does have ear ringing.  Bedside exercise didn't help with vertigo.    Meds, vitals, and allergies reviewed.   ROS: Per HPI unless specifically indicated in ROS section   GEN: nad, alert and oriented HEENT: mucous membranes moist, tm wnl B NECK: supple w/o LA CV: rrr. PULM: ctab, no inc wob ABD: soft, +bs EXT: no edema CN 2-12 wnl B, S/S/DTR wnl x4 Deferred DHP since she already had vertigo sx at the [place] with head turning.

## 2017-09-09 NOTE — Patient Instructions (Signed)
Let me talk to Dr. Tamera Reason in the meantime.  Try meclizine for a day or two.  We'll be in touch.  Take care.  Glad to see you.

## 2017-09-10 NOTE — Assessment & Plan Note (Signed)
She does not have orthostatic symptoms.  She is able to stand without symptoms as long as she does not move her head.  Reasonable to retry meclizine in the meantime to see if she gets any symptomatic relief.  Otherwise she has a normal neuro exam w/o focal weakness.  Her symptoms have been worse, along with the tremor, since she fell. D/w pt.   I will talk with neuro MD re: vestibular rehab vs MRI vs ENT.   Patient agrees.  At this point, okay for outpatient f/u.

## 2017-09-13 ENCOUNTER — Telehealth: Payer: Self-pay | Admitting: Family Medicine

## 2017-09-13 ENCOUNTER — Other Ambulatory Visit: Payer: Self-pay | Admitting: Diagnostic Neuroimaging

## 2017-09-13 ENCOUNTER — Telehealth: Payer: Self-pay | Admitting: *Deleted

## 2017-09-13 DIAGNOSIS — IMO0001 Reserved for inherently not codable concepts without codable children: Secondary | ICD-10-CM

## 2017-09-13 NOTE — Progress Notes (Signed)
Vertigo increasing. Updated per PCP. Will order additional testing (MRI brain and IAC) and vestibular PT. -VRP

## 2017-09-13 NOTE — Telephone Encounter (Signed)
Phoned patient to advise.

## 2017-09-13 NOTE — Telephone Encounter (Signed)
-----   Message from Tonia Ghent, MD sent at 09/13/2017  7:33 AM EST ----- See below. Call pt.  I asked neuro to put in the orders re: MRI and vestibular rehab.  They should be getting in contact with her.  Thanks.   Brigitte Pulse  ----- Message ----- From: Penni Bombard, MD Sent: 09/10/2017   8:49 AM To: Tonia Ghent, MD  Phillip Heal,  I would recommend:  - check MRI brain and IAC protocol (with and without) - refer to vestibular therapy - then I can see her in follow up if needed as well  If you want, I can order this testing. Let me know.  Thanks,  VRP   ----- Message ----- From: Tonia Ghent, MD Sent: 09/10/2017   6:39 AM To: Penni Bombard, MD  Her vertigo have been worse, along with the tremor, since she fell. She has a normal neuro exam o/w.   What is your preference re: vestibular rehab vs MRI vs ENT? Thanks.   Brigitte Pulse

## 2017-09-13 NOTE — Telephone Encounter (Signed)
Opened in error

## 2017-10-31 ENCOUNTER — Other Ambulatory Visit: Payer: Self-pay | Admitting: Family Medicine

## 2017-11-01 ENCOUNTER — Encounter: Payer: Self-pay | Admitting: *Deleted

## 2017-12-23 ENCOUNTER — Other Ambulatory Visit: Payer: Self-pay | Admitting: Family Medicine

## 2017-12-23 DIAGNOSIS — E039 Hypothyroidism, unspecified: Secondary | ICD-10-CM

## 2017-12-29 ENCOUNTER — Encounter: Payer: Self-pay | Admitting: Family Medicine

## 2017-12-29 ENCOUNTER — Telehealth: Payer: Self-pay | Admitting: Family Medicine

## 2017-12-29 MED ORDER — LORAZEPAM 0.5 MG PO TABS: 0.2500 mg | ORAL_TABLET | Freq: Two times a day (BID) | ORAL | 0 refills | Status: DC | PRN

## 2017-12-29 NOTE — Telephone Encounter (Signed)
Please try to get update on patient, give my condolences.   rx sent for ativan to pharmacy.  Use prn, sedation caution.  Don't use at night since she had nightmares with temazepam.   Thanks.

## 2017-12-29 NOTE — Telephone Encounter (Signed)
Patient notified as instructed by telephone and verbalized understanding. Patient stated that she is doing as well as can be expected. Patient stated that when she gets over this she will be calling in the next couple of weeks to schedule an appointment with Dr. Damita Dunnings.

## 2017-12-29 NOTE — Telephone Encounter (Signed)
Copied from [place] [phone]. Topic: Quick Communication - See Telephone Encounter >> Dec 29, 2017  9:16 AM Hewitt Shorts wrote: Pt is requesting something for her nerves -her husband past away yesterday   [facility] [address]   Best number [phone]

## 2017-12-29 NOTE — Telephone Encounter (Signed)
Attempted to call patient- no answer- left message that the message will be forwarded to her provider. We try to reach out to her later today.

## 2017-12-29 NOTE — Telephone Encounter (Signed)
Noted. Thanks.

## 2018-01-13 ENCOUNTER — Telehealth: Payer: Self-pay | Admitting: *Deleted

## 2018-01-13 MED ORDER — LORAZEPAM 0.5 MG PO TABS: 0.2500 mg | ORAL_TABLET | Freq: Two times a day (BID) | ORAL | 0 refills | Status: DC | PRN

## 2018-01-13 NOTE — Telephone Encounter (Signed)
Called pt, can try repeat rx of lorazepam prn.  She'll update me as needed.   rx sent.  She agrees with plan.

## 2018-01-13 NOTE — Telephone Encounter (Signed)
Copied from [place] [phone]. Topic: Quick Communication - See Telephone Encounter >> Dec 29, 2017  9:16 AM Hewitt Shorts wrote: Pt is requesting something for her nerves -her husband past away yesterday   [facility] [address]   Best number [phone] >> Jan 13, 2018  9:30 AM Oneta Rack wrote: Relation to pt: self Call back number:[phone]    Reason for call:  Patient states she's having difficulty sleeping due to her spouse passing, please advise

## 2018-01-20 ENCOUNTER — Other Ambulatory Visit: Payer: Self-pay | Admitting: Family Medicine

## 2018-01-24 ENCOUNTER — Other Ambulatory Visit: Payer: Self-pay | Admitting: Family Medicine

## 2018-01-24 NOTE — Telephone Encounter (Signed)
Electronic refill request Last refill 01/13/18 #20 Last office visit 09/09/17

## 2018-01-25 NOTE — Telephone Encounter (Signed)
Sent. Thanks.   

## 2018-02-14 ENCOUNTER — Other Ambulatory Visit: Payer: Self-pay | Admitting: Family Medicine

## 2018-02-14 DIAGNOSIS — E785 Hyperlipidemia, unspecified: Secondary | ICD-10-CM

## 2018-02-14 DIAGNOSIS — E039 Hypothyroidism, unspecified: Secondary | ICD-10-CM

## 2018-02-14 DIAGNOSIS — R739 Hyperglycemia, unspecified: Secondary | ICD-10-CM

## 2018-02-15 ENCOUNTER — Other Ambulatory Visit: Payer: Self-pay | Admitting: Family Medicine

## 2018-02-15 NOTE — Telephone Encounter (Signed)
Name of Medication: Lorazepam Name of Pharmacy: [facility] or Written Date and Quantity: 01/25/2018 for #20 with no refills Last Office Visit and Type: 09/09/2017 Dizziness Next Office Visit and Type: 03/03/2018 CPE Last Controlled Substance Agreement Date: Never Last UDS: Never

## 2018-02-16 NOTE — Telephone Encounter (Signed)
Sent. Thanks.   

## 2018-02-24 ENCOUNTER — Other Ambulatory Visit (INDEPENDENT_AMBULATORY_CARE_PROVIDER_SITE_OTHER): Payer: Managed Care, Other (non HMO)

## 2018-02-24 DIAGNOSIS — E785 Hyperlipidemia, unspecified: Secondary | ICD-10-CM

## 2018-02-24 DIAGNOSIS — E039 Hypothyroidism, unspecified: Secondary | ICD-10-CM | POA: Diagnosis not present

## 2018-02-24 DIAGNOSIS — R739 Hyperglycemia, unspecified: Secondary | ICD-10-CM

## 2018-02-24 LAB — LIPID PANEL
CHOLESTEROL: 138 mg/dL (ref 0–200)
HDL: 51.4 mg/dL (ref >39.00)
LDL CALC: 70 mg/dL (ref 0–99)
NonHDL: 86.98
Total CHOL/HDL Ratio: 3
Triglycerides: 83 mg/dL (ref 0.0–149.0)
VLDL: 16.6 mg/dL (ref 0.0–40.0)

## 2018-02-24 LAB — COMPREHENSIVE METABOLIC PANEL
ALBUMIN: 4.2 g/dL (ref 3.5–5.2)
ALT: 23 U/L (ref 0–35)
AST: 22 U/L (ref 0–37)
Alkaline Phosphatase: 67 U/L (ref 39–117)
BUN: 14 mg/dL (ref 6–23)
CALCIUM: 9.1 mg/dL (ref 8.4–10.5)
CHLORIDE: 104 meq/L (ref 96–112)
CO2: 29 mEq/L (ref 19–32)
CREATININE: 0.69 mg/dL (ref 0.40–1.20)
GFR: 91.18 mL/min (ref >60.00)
Glucose, Bld: 114 mg/dL — ABNORMAL HIGH (ref 70–99)
Potassium: 4 mEq/L (ref 3.5–5.1)
Sodium: 140 mEq/L (ref 135–145)
Total Bilirubin: 0.5 mg/dL (ref 0.2–1.2)
Total Protein: 7 g/dL (ref 6.0–8.3)

## 2018-02-24 LAB — HEMOGLOBIN A1C: Hgb A1c MFr Bld: 5 % (ref 4.6–6.5)

## 2018-02-24 LAB — TSH: TSH: <0.01 u[IU]/mL — ABNORMAL LOW (ref 0.35–4.50)

## 2018-02-27 ENCOUNTER — Other Ambulatory Visit: Payer: Self-pay | Admitting: Family Medicine

## 2018-02-27 DIAGNOSIS — E039 Hypothyroidism, unspecified: Secondary | ICD-10-CM

## 2018-02-27 MED ORDER — LEVOTHYROXINE SODIUM 150 MCG PO TABS: ORAL_TABLET | ORAL | Status: DC

## 2018-03-01 ENCOUNTER — Other Ambulatory Visit: Payer: Self-pay | Admitting: Family Medicine

## 2018-03-01 DIAGNOSIS — E039 Hypothyroidism, unspecified: Secondary | ICD-10-CM

## 2018-03-01 MED ORDER — LEVOTHYROXINE SODIUM 150 MCG PO TABS: ORAL_TABLET | ORAL | Status: DC

## 2018-03-03 ENCOUNTER — Encounter: Payer: Managed Care, Other (non HMO) | Admitting: Family Medicine

## 2018-03-03 ENCOUNTER — Other Ambulatory Visit: Payer: Self-pay | Admitting: Family Medicine

## 2018-03-03 NOTE — Telephone Encounter (Signed)
Name of Medication: Lorazepam Name of Pharmacy: [facility], [place] Last [place] or Written Date and Quantity:  20 tablet 0 02/16/2018  Last Office Visit and Type: 09/09/17 Acute Next Office Visit and Type: 03/10/18 CPE Last Controlled Substance Agreement Date: None Last UDS: None

## 2018-03-04 NOTE — Telephone Encounter (Signed)
Sent. Thanks.   

## 2018-03-10 ENCOUNTER — Encounter: Payer: Self-pay | Admitting: Family Medicine

## 2018-03-10 ENCOUNTER — Ambulatory Visit (INDEPENDENT_AMBULATORY_CARE_PROVIDER_SITE_OTHER): Payer: Managed Care, Other (non HMO) | Admitting: Family Medicine

## 2018-03-10 VITALS — BP 162/80 | HR 63 | Temp 98.4°F | Ht 62.5 in | Wt 133.0 lb

## 2018-03-10 DIAGNOSIS — F329 Major depressive disorder, single episode, unspecified: Secondary | ICD-10-CM

## 2018-03-10 DIAGNOSIS — E039 Hypothyroidism, unspecified: Secondary | ICD-10-CM

## 2018-03-10 DIAGNOSIS — F32A Depression, unspecified: Secondary | ICD-10-CM

## 2018-03-10 DIAGNOSIS — Z0000 Encounter for general adult medical examination without abnormal findings: Secondary | ICD-10-CM | POA: Diagnosis not present

## 2018-03-10 DIAGNOSIS — R251 Tremor, unspecified: Secondary | ICD-10-CM

## 2018-03-10 DIAGNOSIS — Z7189 Other specified counseling: Secondary | ICD-10-CM

## 2018-03-10 DIAGNOSIS — R030 Elevated blood-pressure reading, without diagnosis of hypertension: Secondary | ICD-10-CM

## 2018-03-10 DIAGNOSIS — E785 Hyperlipidemia, unspecified: Secondary | ICD-10-CM

## 2018-03-10 MED ORDER — FLUOXETINE HCL 20 MG PO TABS: 40.0000 mg | ORAL_TABLET | Freq: Every day | ORAL | 3 refills | Status: DC

## 2018-03-10 MED ORDER — SIMVASTATIN 20 MG PO TABS: 20.0000 mg | ORAL_TABLET | Freq: Every day | ORAL | 3 refills | Status: DC

## 2018-03-10 MED ORDER — LORAZEPAM 0.5 MG PO TABS: ORAL_TABLET | ORAL | 0 refills | Status: DC

## 2018-03-10 MED ORDER — LEVOTHYROXINE SODIUM 150 MCG PO TABS: ORAL_TABLET | ORAL | 3 refills | Status: DC

## 2018-03-10 NOTE — Progress Notes (Signed)
CPE- See plan.  Routine anticipatory guidance given to patient.  See health maintenance.  The possibility exists that previously documented standard health maintenance information may have been brought forward from a previous encounter into this note.  If needed, that same information has been updated to reflect the current situation based on today's encounter.    Pap per gyn, Dr. Gertie Fey in [place] [facility]  I'll defer.   DXA done per gyn Mammogram done 2018 per gyn. Up to date per patient report.   Living will d/w pt. Abigail Marks would be designated if patient were incapacitated.  Tetanus 2014 Flu shot due fall 2019 PNA not due yet.  Shingles done 2017 at pharmacy.  Diet and exercise d/w pt. weight is down- sig upheaval with death of husband.  She is working to get back to a more normal level of food intake.   HIV done prev at GYN clinic. HCV screening prev done.    Mood and grief d/w pt.  Still on SSRI at baseline.  Still with insomnia.  Used lorazepam prn in the meantime.  Routine BZD cautions d/w pt.  No SI/HI.  Insomnia.  She used [employer] but not now.  No ADE on med but not a lot of effect with lunesta.     Metoprolol used prn for tremor.  No ADE on med.  It helps when used, needed.  Elevated Cholesterol: Using medications without problems: yes Muscle aches: no Diet compliance: encouraged, when possible.  Exercise: encouraged, when possible.   Hypothyroidism.  Overreplaced, with plan to recheck on lower dose in about 2 months.    Defer BP tx given recent events.  D/w pt.    PMH and SH reviewed  Meds, vitals, and allergies reviewed.   ROS: Per HPI.  Unless specifically indicated otherwise in HPI, the patient denies:  General: fever. Eyes: acute vision changes ENT: sore throat Cardiovascular: chest pain Respiratory: SOB GI: vomiting GU: dysuria Musculoskeletal: acute back pain Derm: acute rash Neuro: acute motor dysfunction Psych: worsening  mood Endocrine: polydipsia Heme: bleeding Allergy: hayfever  GEN: nad, alert and oriented, tearful but regains composure. HEENT: mucous membranes moist NECK: supple w/o LA CV: rrr. PULM: ctab, no inc wob ABD: soft, +bs EXT: no edema SKIN: no acute rash

## 2018-03-10 NOTE — Patient Instructions (Signed)
Recheck TSH in about 2 months.  Try the higher dose of fluoxetine.  Update me as needed.  Take care.  Glad to see you.

## 2018-03-13 NOTE — Assessment & Plan Note (Signed)
Defer treatment at this point given recent events.  We can readdress if her blood pressure is persistently elevated.

## 2018-03-13 NOTE — Assessment & Plan Note (Signed)
Continue dose as is and plan on recheck TSH in about 2 months.  She agrees.

## 2018-03-13 NOTE — Assessment & Plan Note (Signed)
Complicated by grief.  No suicidal or homicidal intent.  Still okay for outpatient follow-up.  Discussed with patient about options.  Still reasonable to use lorazepam as needed.  Reasonable to increase fluoxetine to 40 mg a day.  She will update me as needed with that.  It may be that a combination of higher dose of fluoxetine, continued as needed dosing of lorazepam, with passage of time and some exercise may help her mood.  She agrees with plan.  Routine medication cautions given.  She agrees.

## 2018-03-13 NOTE — Assessment & Plan Note (Signed)
Continue statin.  No adverse effect.  She agrees.  See above.

## 2018-03-13 NOTE — Assessment & Plan Note (Signed)
Continue as needed beta-blocker use.  She agrees.

## 2018-03-13 NOTE — Assessment & Plan Note (Signed)
Living will d/w pt. Abigail Marks would be designated if patient were incapacitated.

## 2018-03-13 NOTE — Assessment & Plan Note (Signed)
Pap per gyn, Dr. Gertie Fey in [place] [facility]  I'll defer.   DXA done per gyn Mammogram done 2018 per gyn. Up to date per patient report.   Living will d/w pt. Abigail Marks would be designated if patient were incapacitated.  Tetanus 2014 Flu shot due fall 2019 PNA not due yet.  Shingles done 2017 at pharmacy.  Diet and exercise d/w pt. weight is down- sig upheaval with death of husband.  She is working to get back to a more normal level of food intake.   HIV done prev at GYN clinic. HCV screening prev done.

## 2018-03-25 ENCOUNTER — Other Ambulatory Visit: Payer: Self-pay | Admitting: Family Medicine

## 2018-03-25 NOTE — Telephone Encounter (Signed)
Electronic refill request. Lorazepam Last office visit:   03/10/18 CPE Last Filled:    20 tablet 0 03/10/2018  Please advise.

## 2018-03-27 NOTE — Telephone Encounter (Signed)
Please see how she is doing on the higher dose of fluoxetine.   Let me know.  Thanks.  Sent rx.

## 2018-03-28 NOTE — Telephone Encounter (Signed)
Duly noted.  If the higher dose of fluoxetine isn't noted to have any effect in the 2 weeks, then please have her let me know.  Update [place] sooner if worse in the meantime.  Thanks.

## 2018-03-28 NOTE — Telephone Encounter (Signed)
Patient says she really doesn't think the increased dose of Fluoxetine is helping, she really can't tell a difference.  Patient says she has had a very difficult few days and has had to use more Lorazepam than usual.

## 2018-03-29 NOTE — Telephone Encounter (Signed)
Patient advised.

## 2018-04-07 ENCOUNTER — Other Ambulatory Visit: Payer: Self-pay | Admitting: Family Medicine

## 2018-04-07 NOTE — Telephone Encounter (Signed)
Electronic refill request.  Lorazepam Last office visit:   03/10/18 CPE Last Filled:    20 tablet 0 03/27/2018  Please advise.

## 2018-04-08 NOTE — Telephone Encounter (Signed)
Please call in.  My authentication program is not allowing me to send rx electronically at this point.  Thanks.

## 2018-04-08 NOTE — Telephone Encounter (Signed)
Medication phoned to pharmacy.  

## 2018-04-26 ENCOUNTER — Other Ambulatory Visit: Payer: Self-pay | Admitting: Family Medicine

## 2018-04-26 NOTE — Telephone Encounter (Signed)
Electronic refill request Last refill 04/08/18 #30 Last office visit 03/10/18 See allergy/contrainidcation

## 2018-04-27 NOTE — Telephone Encounter (Signed)
Sent. Thanks.   

## 2018-05-16 ENCOUNTER — Other Ambulatory Visit (INDEPENDENT_AMBULATORY_CARE_PROVIDER_SITE_OTHER): Payer: Managed Care, Other (non HMO)

## 2018-05-16 DIAGNOSIS — E039 Hypothyroidism, unspecified: Secondary | ICD-10-CM

## 2018-05-16 LAB — TSH: TSH: 0.92 u[IU]/mL (ref 0.35–4.50)

## 2018-05-18 ENCOUNTER — Other Ambulatory Visit: Payer: Self-pay | Admitting: Family Medicine

## 2018-05-18 NOTE — Telephone Encounter (Signed)
Sent. Thanks.  Okay to continue. 

## 2018-05-18 NOTE — Telephone Encounter (Signed)
Electronic refill request Lorazepam Last office visit 03/10/18 Last refill 04/17/18 #30 See allergy/contraindicaiton

## 2018-06-07 ENCOUNTER — Other Ambulatory Visit: Payer: Self-pay | Admitting: Family Medicine

## 2018-06-07 NOTE — Telephone Encounter (Signed)
Electronic refill request Lorazepam Last office visit 03/10/18 Last refill 05/18/18 #30

## 2018-06-08 NOTE — Telephone Encounter (Signed)
Please get update on patient re: mood.  rx sent.  Thanks.

## 2018-06-08 NOTE — Telephone Encounter (Signed)
Spoke to patient and was advised that the medication is helping with her mood. Patient stated that she has tried to go several days without the medication and realizes that she needs it. Patient stated that she is working on getting set up for counseling in the near future.

## 2018-06-09 NOTE — Telephone Encounter (Signed)
Noted. Thanks for checking on patient.  

## 2018-06-29 ENCOUNTER — Other Ambulatory Visit: Payer: Self-pay | Admitting: Family Medicine

## 2018-06-29 NOTE — Telephone Encounter (Signed)
Electronic refill request Ativan Last office visit 03/10/18 Last refill 06/08/18 #30 See allergy/contraindication

## 2018-06-30 NOTE — Telephone Encounter (Signed)
Sent. Thanks.  Okay to continue. 

## 2018-07-08 ENCOUNTER — Other Ambulatory Visit: Payer: Self-pay | Admitting: Obstetrics and Gynecology

## 2018-07-08 DIAGNOSIS — Z803 Family history of malignant neoplasm of breast: Secondary | ICD-10-CM

## 2018-07-20 ENCOUNTER — Encounter: Payer: Self-pay | Admitting: Family Medicine

## 2018-07-24 ENCOUNTER — Ambulatory Visit
Admission: RE | Admit: 2018-07-24 | Discharge: 2018-07-24 | Disposition: A | Discharge status: Home / Self Care | Payer: Managed Care, Other (non HMO) | Source: Ambulatory Visit | Attending: Obstetrics and Gynecology | Admitting: Obstetrics and Gynecology

## 2018-07-24 DIAGNOSIS — Z803 Family history of malignant neoplasm of breast: Secondary | ICD-10-CM

## 2018-07-24 MED ORDER — GADOBUTROL 1 MMOL/ML IV SOLN
6.0000 mL | Freq: Once | INTRAVENOUS | Status: AC | PRN
  Administered 2018-07-24: 6 mL via INTRAVENOUS

## 2018-08-03 ENCOUNTER — Other Ambulatory Visit: Payer: Self-pay | Admitting: Family Medicine

## 2018-08-03 NOTE — Telephone Encounter (Signed)
Electronic refill request Lorazepam Last office visit 03/10/18 Last refill 06/30/18 #30/1

## 2018-08-03 NOTE — Telephone Encounter (Signed)
Sent. Thanks.   

## 2018-09-05 ENCOUNTER — Other Ambulatory Visit: Payer: Self-pay | Admitting: Family Medicine

## 2018-09-05 NOTE — Telephone Encounter (Signed)
Sent.  Reasonable to get OV set up re: mood, please get 30 min.  Thanks.

## 2018-09-05 NOTE — Telephone Encounter (Signed)
Electronic refill request. Lorazepam Last office visit:   03/10/18 Last Filled:    30 tablet 1 08/03/2018  Please advise.

## 2018-09-06 NOTE — Telephone Encounter (Signed)
Left detailed message on voicemail.  

## 2018-12-10 ENCOUNTER — Other Ambulatory Visit: Payer: Self-pay | Admitting: Family Medicine

## 2018-12-10 DIAGNOSIS — E039 Hypothyroidism, unspecified: Secondary | ICD-10-CM

## 2019-01-04 ENCOUNTER — Encounter: Payer: Self-pay | Admitting: Family Medicine

## 2019-01-06 ENCOUNTER — Other Ambulatory Visit: Payer: Self-pay | Admitting: Family Medicine

## 2019-01-06 DIAGNOSIS — R251 Tremor, unspecified: Secondary | ICD-10-CM

## 2019-01-10 ENCOUNTER — Encounter: Payer: Self-pay | Admitting: Neurology

## 2019-02-14 NOTE — Progress Notes (Signed)
Abigail Marks was seen today in the movement disorders clinic for neurologic consultation at the request of Damita Dunnings Elveria Rising, MD.  The consultation is for the evaluation of tremor.  Lengthy medical records are reviewed.  There are notes in her chart all the way back to 2012 related to complaints of tremor in her hands.  Records indicate that the patient was placed on metoprolol, 12.5 mg daily for tremor in December, 2016.  She only takes in prn tremor.  It helps tremor but drops her BP and makes her feel lightheaded and feel "washed out."  She takes it 5 times per week.  She did not take it today.  Tremor: Yes.     How long has it been going on? Not sure but in records since 2012  At rest or with activation?  either  When is it noted the most?  "sitting still"  Fam hx of tremor?  Yes.   mother with Parkinson's disease; sister just had tremor  Located where?  Started in the L hand, in the R hand now.    Affected by caffeine:  unknown (2-3 cups coffee/day)  Affected by alcohol:  Yes.   , decreases it (5 beers per week)  Affected by stress:  Yes.    Affected by fatigue:  Yes.    Spills soup if on spoon:  May or may not  Spills glass of liquid if full:  No.  Affects ADL's (tying shoes, brushing teeth, etc):  No., but a little trouble with eyeline  Tremor inducing meds:  No.  Other Specific Symptoms:  Voice: "people say I talk low" Sleep:   Vivid Dreams:  Yes.  , sometimes  Acting out dreams:  No. Wet Pillows: sometimes Postural symptoms:  Yes.  , a little off balance and "I go to one side or another but I don't fall"  Falls?  No. Bradykinesia symptoms: shorter stride length Loss of smell:  No. - "Its better than it was" Loss of taste:  Yes.   Urinary Incontinence:  Yes.  , with stress (has a sling) Difficulty Swallowing:  Choke sometimes on water Handwriting, micrographia: No. , just shakier than in the past Trouble with ADL's:  No.  Trouble buttoning clothing: No. Depression:   Yes.  , husband died last year at 41 y/o of MI Memory changes:  Yes.  , "i'm struggling with work" Hallucinations:  No.  visual distortions: Yes.   N/V:  No. Lightheaded:  Occasionally has vertigo but hasn't had it in a while  Syncope: No. Diplopia:  No. Dyskinesia:  No.  CT brain was performed in June, 2018 for dizziness and headache.  It was essentially unremarkable, but there was some frontal atrophy bilaterally.  I have personally reviewed this.  PREVIOUS MEDICATIONS: none to date  ALLERGIES:   Allergies  Allergen Reactions  . Atorvastatin     REACTION: muscle pain  . Bupropion Hcl     REACTION: decreased  BP  . Ezetimibe-Simvastatin     REACTION: nausea  . Lunesta [Eszopiclone] Other (See Comments)    Lack of effect  . Mirtazapine     REACTION: swelling  . Nsaids Other (See Comments)    gi  . Omeprazole-Sodium Bicarbonate     REACTION: GI upset  . Paroxetine     REACTION: felt bad  . Raloxifene     REACTION: leg pain  . Rosuvastatin     REACTION: increased ALT  . Temazepam  nightmares  . Trazodone And Nefazodone     nightmares  . Zolpidem Tartrate     intolerant    CURRENT MEDICATIONS:  Outpatient Encounter Medications as of 02/20/2019  Medication Sig  . Calcium Carbonate-Vitamin D (CALCIUM 600+D HIGH POTENCY) 600-400 MG-UNIT per tablet Take 1 or 2 capsules by mouth daily.   Marland Kitchen FLUoxetine (PROZAC) 20 MG tablet Take 2 tablets (40 mg total) by mouth daily.  Marland Kitchen levothyroxine (SYNTHROID) 150 MCG tablet TAKE 1 TABLET BY MOUTH ONCE DAILY BEFORE BREAKFAST EXCEPT 1/2 TABLET ON SUNDAYS AND WEDNESDAYS. 6 TABLETS PER WEEK...NEEDS TO SCHEDULE PHYSICAL EXAM  . metoprolol tartrate (LOPRESSOR) 25 MG tablet TAKE 1/2 TO 1 TABLET BY MOUTH TWICE DAILY  . Multiple Vitamin (MULTIVITAMIN) tablet Take 1 tablet by mouth daily.    . Omega-3 Fatty Acids (FISH OIL PO) Take by mouth.  . simvastatin (ZOCOR) 20 MG tablet Take 1 tablet (20 mg total) by mouth daily.  . [DISCONTINUED]  Fluoxetine HCl, PMDD, 10 MG TABS Take 1 tablet (10 mg total) by mouth daily.  . [DISCONTINUED] folic acid (FOLVITE) 0.5 MG tablet Take 400 mcg by mouth daily.  . [DISCONTINUED] loperamide (IMODIUM A-D) 2 MG tablet Take 1 tablet (2 mg total) by mouth daily as needed for diarrhea or loose stools. (Patient not taking: Reported on 02/20/2019)  . [DISCONTINUED] LORazepam (ATIVAN) 0.5 MG tablet TAKE 1/2 TO 1 TABLET BY MOUTH TWICE DAILY AS NEEDED FOR ANXIETY (Patient not taking: Reported on 02/20/2019)  . [DISCONTINUED] Lysine HCl 500 MG CAPS Take by mouth.  . [DISCONTINUED] meclizine (ANTIVERT) 25 MG tablet Take 0.5-1 tablets (12.5-25 mg total) by mouth 3 (three) times daily as needed for dizziness. (Patient not taking: Reported on 02/20/2019)   No facility-administered encounter medications on file as of 02/20/2019.     PAST MEDICAL HISTORY:   Past Medical History:  Diagnosis Date  . Anemia 03/2010  . Arthritis   . Complication of anesthesia   . Depression   . GERD (gastroesophageal reflux disease)   . Herniated lumbar intervertebral disc 10/2000   Injections  . History of hiatal hernia   . Hyperlipidemia   . Hypothyroidism   . PONV (postoperative nausea and vomiting)   . Thyroid disease    Hypothyroidism s/p radioactive ablation  . Tremor     PAST SURGICAL HISTORY:   Past Surgical History:  Procedure Laterality Date  . ABDOMINAL HYSTERECTOMY     Partial,prolapse  . BLADDER SUSPENSION  03/2005  . CATARACT EXTRACTION W/PHACO Right 11/11/2015   Procedure: CATARACT EXTRACTION PHACO AND INTRAOCULAR LENS PLACEMENT (IOC);  Surgeon: Steven Dingeldein, MD;  Location: [facility] ORS;  Service: Ophthalmology;  Laterality: Right;  US 00:55 AP% 23.0 CDE 22.84 fluid pack lot # [id number]  . CATARACT EXTRACTION W/PHACO Left 11/01/2015   Procedure: CATARACT EXTRACTION PHACO AND INTRAOCULAR LENS PLACEMENT (IOC);  Surgeon: Steven Dingeldein, MD;  Location: [facility] ORS;  Service: Ophthalmology;  Laterality: Left;  US             1:05 AP%        23 CDE       28 .79       fluid casette lot # [id number]  exp5/31/2018  . CHOLECYSTECTOMY    . OOPHORECTOMY     bilateral cysts    SOCIAL HISTORY:   Social History   Socioeconomic History  . Marital status: Widowed    Spouse name: Not on file  . Number of children: 2  . Years of education: Not on  file  . Highest education level: Associate degree: academic program  Occupational History  . 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 Financial resource strain: Not on file  . Food insecurity    Worry: Not on file    Inability: Not on file  . Transportation needs    Medical: Not on file    Non-medical: Not on file  Tobacco Use  . Smoking status: Former Smoker    Quit date: 08/17/2004    Years since quitting: 14.5  . Smokeless tobacco: Never Used  Substance and Sexual Activity  . Alcohol use: Yes    Alcohol/week: 5.0 standard drinks    Types: 5 Cans of beer per week  . Drug use: No  . Sexual activity: Not on file  Lifestyle  . Physical activity    Days per week: Not on file    Minutes per session: Not on file  . Stress: Not on file  Relationships  . Social Herbalist on phone: Not on file    Gets together: Not on file    Attends religious service: Not on file    Active member of club or organization: Not on file    Attends meetings of clubs or organizations: Not on file    Relationship status: Not on file  . Intimate partner violence    Fear of current or ex partner: Not on file    Emotionally abused: Not on file    Physically abused: Not on file    Forced sexual activity: Not on file  Other Topics Concern  . Not on file  Social History Narrative   From [place]   Utilization review RN for Hospice   Widowed 12/2017    FAMILY HISTORY:   Family Status  Relation Name Status  . Mother  Deceased at age 46       HTN, DM, goiter, breast CA, MI  . Sister  Alive       breast CA  . Father  Other       no  contact  . Son  Alive  . Daughter  Alive  . Brother boating accident Deceased  . Neg Hx  (Not Specified)    ROS:  Review of Systems  Constitutional: Negative.   HENT: Negative.   Eyes: Negative.   Respiratory: Negative.   Cardiovascular: Negative.   Gastrointestinal: Positive for constipation and diarrhea (alternate between diarrhea and constipation).  Genitourinary: Positive for frequency.  Musculoskeletal: Positive for joint pain (R shoulder).  Skin: Negative.   Endo/Heme/Allergies: Negative.   Psychiatric/Behavioral: Positive for memory loss (Worries about ability to do her job.  Plans to retire in January.).    PHYSICAL EXAMINATION:    VITALS:   Vitals:   02/20/19 0945  BP: (!) 146/74  Pulse: 65  Temp: 98.7 F (37.1 C)  SpO2: 97%  Weight: 140 lb 3.2 oz (63.6 kg)  Height: 5\' 4"  (1.626 m)    GEN:  The patient appears stated age and is in NAD.  She is anxious.  She is tearful at times (when referring to the death of her husband). HEENT:  Normocephalic, atraumatic.  The mucous membranes are moist. The superficial temporal arteries are without ropiness or tenderness. CV:  RRR Lungs:  CTAB Neck/HEME:  There are no carotid bruits bilaterally.  Neurological examination:  Orientation: The patient is alert and oriented x3. Fund of knowledge is appropriate.  Recent and remote memory are intact.  Attention and concentration are normal.    Able to name objects and repeat phrases. Cranial nerves: There is good facial symmetry.  Extraocular muscles are intact. The visual fields are full to confrontational testing. The speech is fluent and clear. Soft palate rises symmetrically and there is no tongue deviation. Hearing is intact to conversational tone. Sensation: Sensation is intact to light and pinprick throughout (facial, trunk, extremities). Vibration is intact at the bilateral big toe. There is no extinction with double simultaneous stimulation. There is no sensory dermatomal  level identified. Motor: Strength is 5/5 in the bilateral upper and lower extremities.   Shoulder shrug is equal and symmetric.  There is no pronator drift. Deep tendon reflexes: Deep tendon reflexes are 2/4 at the bilateral biceps, triceps, brachioradialis, patella and achilles. Plantar responses are downgoing bilaterally.  Movement examination: Tone: There is normal tone in the bilateral upper extremities.  The tone in the lower extremities is normal.  Abnormal movements: There is no rest tremor.  There is very mild postural tremor.  There is mild tremor with Archimedes spirals.  She has mild tremor when pouring water from one glass to another, but really does not spill the water. Coordination:  There is no decremation with RAM's, with any form of RAMS, including alternating supination and pronation of the forearm, hand opening and closing, finger taps, heel taps and toe taps. Gait and Station: The patient has no difficulty arising out of a deep-seated chair without the use of the hands. The patient's stride length is good.  She is able to ambulate in a tandem fashion.  She is able to walk on her heels and toes.  She is able to stand in the Romberg position.  Lab Results  Component Value Date   TSH 0.92 05/16/2018     Chemistry      Component Value Date/Time   NA 140 02/24/2018 0740   K 4.0 02/24/2018 0740   K 4.0 04/28/2016   CL 104 02/24/2018 0740   CO2 29 02/24/2018 0740   BUN 14 02/24/2018 0740   CREATININE 0.69 02/24/2018 0740   CREATININE 0.72 04/28/2016   CREATININE 0.76 03/07/2014      Component Value Date/Time   CALCIUM 9.1 02/24/2018 0740   ALKPHOS 67 02/24/2018 0740   AST 22 02/24/2018 0740   AST 20 04/28/2016   ALT 23 02/24/2018 0740   ALT 23 04/28/2016   ALT 26 03/07/2014   BILITOT 0.5 02/24/2018 0740        ASSESSMENT/PLAN:  1.  Tremor  -This is likely essential tremor, but I do think that anxiety significantly exacerbates her tremor.  She and I discussed  this today.  She is on very low-dose metoprolol, 12.5 mg, but only takes this as needed because she states that it drops her blood pressure and makes her feel lightheaded and dizzy.  We talked about alternative options.  She ultimately decided to try primidone, 50 mg daily.  She will start with 1/2 tablet at night for 4 nights and then increase to 1 tablet nightly.  Discussed risk, benefits, and side effects.  -As above, I think that anxiety is making things worse.  She is certainly had a lot of trauma with the death of her husband and I think that this is impacting tremor.  We talked about counseling, which she was in for a while, but ultimately did not think it was that helpful.  I encouraged her to re-seek counseling options, as well as potential medication  options.  2.  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  I did not see any evidence of dementia.  We did talk about neurocognitive testing.  She declined that today.  She will let me know if she changes her mind and we can get her scheduled for that.  3.  I will plan on seeing her back in the next 5 to 6 months, sooner should new neurologic issues arise.  Much greater than 50% of this visit was spent in counseling and coordinating care.  Total face to face time:  60 min  Cc:  Tonia Ghent, MD

## 2019-02-15 DIAGNOSIS — R87610 Atypical squamous cells of undetermined significance on cytologic smear of cervix (ASC-US): Secondary | ICD-10-CM | POA: Diagnosis not present

## 2019-02-15 DIAGNOSIS — R87611 Atypical squamous cells cannot exclude high grade squamous intraepithelial lesion on cytologic smear of cervix (ASC-H): Secondary | ICD-10-CM | POA: Diagnosis not present

## 2019-02-15 DIAGNOSIS — M810 Age-related osteoporosis without current pathological fracture: Secondary | ICD-10-CM | POA: Diagnosis not present

## 2019-02-20 ENCOUNTER — Encounter: Payer: Self-pay | Admitting: Neurology

## 2019-02-20 ENCOUNTER — Other Ambulatory Visit: Payer: Self-pay

## 2019-02-20 ENCOUNTER — Ambulatory Visit (INDEPENDENT_AMBULATORY_CARE_PROVIDER_SITE_OTHER): Payer: 59 | Admitting: Neurology

## 2019-02-20 VITALS — BP 146/74 | HR 65 | Temp 98.7°F | Ht 64.0 in | Wt 140.2 lb

## 2019-02-20 DIAGNOSIS — G250 Essential tremor: Secondary | ICD-10-CM | POA: Diagnosis not present

## 2019-02-20 DIAGNOSIS — R413 Other amnesia: Secondary | ICD-10-CM

## 2019-02-20 MED ORDER — PRIMIDONE 50 MG PO TABS: 50.0000 mg | ORAL_TABLET | Freq: Every day | ORAL | 3 refills | Status: DC

## 2019-02-20 NOTE — Patient Instructions (Signed)
1.  Start primidone 50 mg - 1/2 tablet at night for 4 nights and then 1 tablet at night thereafter 2.  Let us know if you would like to do the neurocognitive memory testing that we discussed 3.  I would like to see you back in 5-6 months 4.  I would recommend counseling to help control the anxiety that drives tremor  The physicians and staff at [facility] are committed to providing excellent care. You may receive a survey requesting feedback about your experience at our office. We strive to receive "very good" responses to the survey questions. If you feel that your experience would prevent you from giving the office a "very good " response, please contact our office to try to remedy the situation. We may be reached at [phone]. Thank you for taking the time out of your busy day to complete the survey.

## 2019-02-23 ENCOUNTER — Ambulatory Visit (INDEPENDENT_AMBULATORY_CARE_PROVIDER_SITE_OTHER): Payer: 59 | Admitting: Orthopedic Surgery

## 2019-02-23 ENCOUNTER — Other Ambulatory Visit: Payer: Self-pay

## 2019-02-23 ENCOUNTER — Ambulatory Visit (INDEPENDENT_AMBULATORY_CARE_PROVIDER_SITE_OTHER): Payer: 59

## 2019-02-23 ENCOUNTER — Encounter: Payer: Self-pay | Admitting: Orthopedic Surgery

## 2019-02-23 DIAGNOSIS — M25511 Pain in right shoulder: Secondary | ICD-10-CM | POA: Diagnosis not present

## 2019-02-23 DIAGNOSIS — M7541 Impingement syndrome of right shoulder: Secondary | ICD-10-CM

## 2019-02-23 DIAGNOSIS — R921 Mammographic calcification found on diagnostic imaging of breast: Secondary | ICD-10-CM

## 2019-02-23 MED ORDER — METHYLPREDNISOLONE ACETATE 40 MG/ML IJ SUSP
40.0000 mg | INTRAMUSCULAR | Status: AC | PRN
  Administered 2019-02-23: 40 mg via INTRA_ARTICULAR

## 2019-02-23 MED ORDER — BUPIVACAINE HCL 0.5 % IJ SOLN
9.0000 mL | INTRAMUSCULAR | Status: AC | PRN
  Administered 2019-02-23: 9 mL via INTRA_ARTICULAR

## 2019-02-23 MED ORDER — LIDOCAINE HCL 1 % IJ SOLN
5.0000 mL | INTRAMUSCULAR | Status: AC | PRN
  Administered 2019-02-23: 5 mL

## 2019-02-23 NOTE — Progress Notes (Signed)
Office Visit Note   Patient: Abigail Marks           Date of Birth: 1956/06/12           MRN: [id number] Visit Date: 02/23/2019 Requested by: Tonia Ghent, MD [place],  [place] [id number] PCP: Tonia Ghent, MD  Subjective: Chief Complaint  Patient presents with  . Right Shoulder - Pain    HPI: Abigail Marks is a patient with right shoulder pain.'s been going on for many months but is gotten worse over the past 2 months.  Reports increased pain in the deltoid and anterior part of the shoulder with activity.  The pain will wake her from sleep at night.  She is right-hand dominant.  Describes decreased range of motion due to pain.  Hurts for her to pick things up.  Denies any neck pain no numbness and tingling no mechanical symptoms in the shoulder but she does report weakness and pain in the shoulder.  Tylenol is not much help.  The pain will wake her from sleep at night 4 nights out of 7.  Hard for her to hold her granddaughter.              ROS: All systems reviewed are negative as they relate to the chief complaint within the history of present illness.  Patient denies  fevers or chills.   Assessment & Plan: Visit Diagnoses:  1. Right shoulder pain, unspecified chronicity   2. Breast calcification, right   3. Impingement syndrome of right shoulder     Plan: Impression is right shoulder pain unclear etiology but with night pain component which is little bit more unusual with garden-variety impingement syndrome.  Rotator cuff strength is good in the shoulder is not frozen.  Plan MRI arthrogram right shoulder to evaluate the rotator cuff and possible biceps tendon as a source of pain.  Subacromial injection performed today.  She does have breast calcification on plain radiographs with history of prior normal mammograms which I could not locate on the current system.  Nonetheless I think she needs evaluation with Abigail Marks to evaluate this breast calcification.   I will see her back after her MRI scan on the shoulder.  Follow-Up Instructions: Return for after MRI.   Orders:  Orders Placed This Encounter  Procedures  . XR Shoulder Right  . MR SHOULDER RIGHT W CONTRAST  . Arthrogram  . Ambulatory referral to General Surgery   No orders of the defined types were placed in this encounter.     Procedures: Large Joint Inj: R subacromial bursa on 02/23/2019 10:16 PM Indications: diagnostic evaluation and pain Details: 18 G 1.5 in needle, posterior approach  Arthrogram: No  Medications: 9 mL bupivacaine 0.5 %; 40 mg methylPREDNISolone acetate 40 MG/ML; 5 mL lidocaine 1 % Outcome: tolerated well, no immediate complications Procedure, treatment alternatives, risks and benefits explained, specific risks discussed. Consent was given by the patient. Immediately prior to procedure a time out was called to verify the correct patient, procedure, equipment, support staff and site/side marked as required. Patient was prepped and draped in the usual sterile fashion.       Clinical Data: No additional findings.  Objective: Vital Signs: There were no vitals taken for this visit.  Physical Exam:   Constitutional: Patient appears well-developed HEENT:  Head: Normocephalic Eyes:EOM are normal Neck: Normal range of motion Cardiovascular: Normal rate Pulmonary/chest: Effort normal Neurologic: Patient is alert Skin: Skin is warm Psychiatric:  Patient has normal mood and affect    Ortho Exam: Ortho exam demonstrates good cervical spine range of motion.  Patient has 5 out of 5 grip EPL FPL interosseous wrist flexion wrist extension bicep triceps and deltoid strength.  Radial pulses intact.  No masses lymphadenopathy or skin changes noted in the right shoulder girdle region.  She has excellent rotator cuff strength and no restriction of passive range of motion.  No discrete AC joint tenderness is noted.  No coarseness or grinding noted with active or  passive range of motion of the shoulder.  Specialty Comments:  No specialty comments available.  Imaging: Xr Shoulder Right  Result Date: 02/23/2019 AP outlet and axillary right shoulder reviewed.  Glenohumeral joint is reduced without arthritis.  No significant AC joint arthritis.  Calcification measuring about 1 cm x 3 mm noted in the upper right quadrant of the breast.  Acromiohumeral distance intact.  Visualized lung fields otherwise clear.  Impression is normal right shoulder with apparent calcification noted on one view only of the right shoulder in the apparent right upper quadrant of the breast.    PMFS History: Patient Active Problem List   Diagnosis Date Noted  . Dizziness 01/27/2017  . Hyperglycemia 10/19/2016  . GERD (gastroesophageal reflux disease) 07/06/2014  . Advance care planning 04/05/2014  . Routine general medical examination at a health care facility 12/25/2012  . Elevated blood pressure reading without diagnosis of hypertension 01/01/2012  . Tremor 03/25/2011  . Anemia 03/31/2010  . UNSPECIFIED PTOSIS OF EYELID 09/28/2007  . Hypothyroidism 02/03/2007  . HLD (hyperlipidemia) 02/03/2007  . Depression 02/03/2007  . IBS 02/03/2007  . INSOMNIA 02/03/2007   Past Medical History:  Diagnosis Date  . Anemia 03/2010  . Arthritis   . Complication of anesthesia   . Depression   . GERD (gastroesophageal reflux disease)   . Herniated lumbar intervertebral disc 10/2000   Injections  . History of hiatal hernia   . Hyperlipidemia   . Hypothyroidism   . PONV (postoperative nausea and vomiting)   . Thyroid disease    Hypothyroidism s/p radioactive ablation  . Tremor     Family History  Problem Relation Age of Onset  . Diabetes Mother   . Hypertension Mother   . Cancer Mother        breast  . Heart disease Mother        MI  . Breast cancer Mother   . Cancer Sister        Breast  . Breast cancer Sister   . Heart disease Sister   . Hypertension Son   .  Kidney disease Son   . Healthy Daughter   . Colon cancer Neg Hx     Past Surgical History:  Procedure Laterality Date  . ABDOMINAL HYSTERECTOMY     Partial,prolapse  . BLADDER SUSPENSION  03/2005  . CATARACT EXTRACTION W/PHACO Right 11/11/2015   Procedure: CATARACT EXTRACTION PHACO AND INTRAOCULAR LENS PLACEMENT (IOC);  Surgeon: Estill Cotta, MD;  Location: [facility] ORS;  Service: Ophthalmology;  Laterality: Right;  [place] 00:55 AP% 23.0 CDE 22.84 fluid pack lot # [id number]  . CATARACT EXTRACTION W/PHACO Left 11/01/2015   Procedure: CATARACT EXTRACTION PHACO AND INTRAOCULAR LENS PLACEMENT (IOC);  Surgeon: Estill Cotta, MD;  Location: [facility] ORS;  Service: Ophthalmology;  Laterality: Left;  [place]            1:05 AP%        23 CDE       28.79  fluid casette lot # [id number]  exp5/31/2018  . CHOLECYSTECTOMY    . OOPHORECTOMY     bilateral cysts   Social History   Occupational History  . 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 Smoking status: Former Smoker    Quit date: 08/17/2004    Years since quitting: 14.5  . Smokeless tobacco: Never Used  Substance and Sexual Activity  . Alcohol use: Yes    Alcohol/week: 5.0 standard drinks    Types: 5 Cans of beer per week  . Drug use: No  . Sexual activity: Not on file

## 2019-02-26 ENCOUNTER — Telehealth: Payer: Self-pay | Admitting: Family Medicine

## 2019-02-26 DIAGNOSIS — R921 Mammographic calcification found on diagnostic imaging of breast: Secondary | ICD-10-CM

## 2019-02-26 NOTE — Telephone Encounter (Signed)
Call pt.  Notes from ortho reviewed.  Per ortho- "She does have breast calcification on plain radiographs with history of prior normal mammograms which I could not locate on the current system.  Nonetheless I think she needs evaluation with Serita Grammes to evaluate this breast calcification."  Does she need a referral?  Let me know if I can be of service.  Thanks.

## 2019-02-27 NOTE — Telephone Encounter (Signed)
Patient advised.  Please do referral.

## 2019-02-27 NOTE — Telephone Encounter (Signed)
Ordered. Thanks

## 2019-02-27 NOTE — Addendum Note (Signed)
Addended by: Tonia Ghent on: 02/27/2019 11:37 PM   Modules accepted: Orders

## 2019-02-28 NOTE — Telephone Encounter (Signed)
Dr Marlou Sa from [facility] already placed the Gen Surgery Referral to Dr Donne Hazel. Called Sarah from [facility] and she will use that Referral for the patient. Called Judson Roch and she has the Referral and called patient to give her the info regarding it. Shaqueena Mauceri to call me if she doesn't get a call this week for the appointment.

## 2019-03-01 NOTE — Telephone Encounter (Signed)
Many thanks. 

## 2019-03-02 DIAGNOSIS — R921 Mammographic calcification found on diagnostic imaging of breast: Secondary | ICD-10-CM | POA: Diagnosis not present

## 2019-03-08 DIAGNOSIS — N893 Dysplasia of vagina, unspecified: Secondary | ICD-10-CM | POA: Diagnosis not present

## 2019-03-08 DIAGNOSIS — N952 Postmenopausal atrophic vaginitis: Secondary | ICD-10-CM | POA: Diagnosis not present

## 2019-03-09 ENCOUNTER — Other Ambulatory Visit: Payer: Self-pay | Admitting: Emergency Medicine

## 2019-03-09 ENCOUNTER — Telehealth: Payer: Self-pay | Admitting: *Deleted

## 2019-03-09 ENCOUNTER — Other Ambulatory Visit: Payer: Self-pay | Admitting: Surgery

## 2019-03-09 DIAGNOSIS — R921 Mammographic calcification found on diagnostic imaging of breast: Secondary | ICD-10-CM

## 2019-03-09 NOTE — Telephone Encounter (Signed)
Patient called back and scheduled appt 

## 2019-03-09 NOTE — Telephone Encounter (Signed)
Called and left the patient a message to call the office back. Need to schedule the patient for a new patient appt  

## 2019-03-11 ENCOUNTER — Other Ambulatory Visit: Payer: Self-pay | Admitting: Family Medicine

## 2019-03-13 ENCOUNTER — Ambulatory Visit
Admission: RE | Admit: 2019-03-13 | Discharge: 2019-03-13 | Disposition: A | Discharge status: Home / Self Care | Payer: 59 | Source: Ambulatory Visit | Attending: Surgery | Admitting: Surgery

## 2019-03-13 ENCOUNTER — Other Ambulatory Visit: Payer: Self-pay

## 2019-03-13 ENCOUNTER — Encounter: Payer: Self-pay | Admitting: Family Medicine

## 2019-03-13 DIAGNOSIS — R921 Mammographic calcification found on diagnostic imaging of breast: Secondary | ICD-10-CM | POA: Diagnosis not present

## 2019-03-14 ENCOUNTER — Other Ambulatory Visit: Payer: Self-pay | Admitting: *Deleted

## 2019-03-14 MED ORDER — FLUOXETINE HCL (PMDD) 10 MG PO TABS: 10.0000 mg | ORAL_TABLET | Freq: Every day | ORAL | 0 refills | Status: DC

## 2019-03-15 ENCOUNTER — Other Ambulatory Visit: Payer: Self-pay

## 2019-03-15 ENCOUNTER — Encounter: Payer: Self-pay | Admitting: Gynecologic Oncology

## 2019-03-15 ENCOUNTER — Inpatient Hospital Stay: Payer: 59 | Attending: Gynecologic Oncology | Admitting: Gynecologic Oncology

## 2019-03-15 VITALS — BP 158/60 | HR 73 | Temp 98.6°F | Resp 18 | Ht 64.0 in | Wt 141.8 lb

## 2019-03-15 DIAGNOSIS — Z90722 Acquired absence of ovaries, bilateral: Secondary | ICD-10-CM | POA: Insufficient documentation

## 2019-03-15 DIAGNOSIS — Z90710 Acquired absence of both cervix and uterus: Secondary | ICD-10-CM | POA: Insufficient documentation

## 2019-03-15 DIAGNOSIS — Z87891 Personal history of nicotine dependence: Secondary | ICD-10-CM | POA: Diagnosis not present

## 2019-03-15 DIAGNOSIS — K219 Gastro-esophageal reflux disease without esophagitis: Secondary | ICD-10-CM | POA: Insufficient documentation

## 2019-03-15 DIAGNOSIS — R87623 High grade squamous intraepithelial lesion on cytologic smear of vagina (HGSIL): Secondary | ICD-10-CM | POA: Insufficient documentation

## 2019-03-15 DIAGNOSIS — D072 Carcinoma in situ of vagina: Secondary | ICD-10-CM | POA: Diagnosis not present

## 2019-03-15 DIAGNOSIS — E039 Hypothyroidism, unspecified: Secondary | ICD-10-CM | POA: Insufficient documentation

## 2019-03-15 DIAGNOSIS — A630 Anogenital (venereal) warts: Secondary | ICD-10-CM | POA: Diagnosis not present

## 2019-03-15 DIAGNOSIS — E785 Hyperlipidemia, unspecified: Secondary | ICD-10-CM | POA: Insufficient documentation

## 2019-03-15 DIAGNOSIS — M1990 Unspecified osteoarthritis, unspecified site: Secondary | ICD-10-CM | POA: Insufficient documentation

## 2019-03-15 DIAGNOSIS — R69 Illness, unspecified: Secondary | ICD-10-CM | POA: Diagnosis not present

## 2019-03-15 DIAGNOSIS — F329 Major depressive disorder, single episode, unspecified: Secondary | ICD-10-CM | POA: Insufficient documentation

## 2019-03-15 DIAGNOSIS — N870 Mild cervical dysplasia: Secondary | ICD-10-CM | POA: Diagnosis not present

## 2019-03-15 MED ORDER — PREMARIN 0.625 MG/GM VA CREA: 1.0000 | TOPICAL_CREAM | VAGINAL | 12 refills | Status: DC

## 2019-03-15 MED ORDER — FLUOROURACIL 5 % EX CREA: TOPICAL_CREAM | CUTANEOUS | 0 refills | Status: DC

## 2019-03-15 MED FILL — FLUOROURACIL 5 % CREA: 5 | 14 days supply | Qty: 40 | Fill #0

## 2019-03-15 MED FILL — PREMARIN VAGINAL CREAM-APPL: 0.625 | 30 days supply | Qty: 30 | Fill #0

## 2019-03-15 NOTE — Patient Instructions (Addendum)
1 Use 5% Efudex concentration cream  2 Apply zinc oxide or A&D diaper rash ointment to the labial area  3 Fill applicator (available for purchase online) to 1g mark and insert into vagina  4 Use once a week at bedtime for 8 weeks and wash hands well afterward. You should also wear a pad.  5 On rising in the morning, take a shower or bath right away. Clean the area well and apply more zinc oxide, continuing for 2-3 days.  6 Avoid sexual intercourse for at least 3 days after applying 5-fluorouracil.  7 Follow-up with physician for a pap test 4 weeks after completion of treatment   Table 1. Fluorouracil application. From [place]. Dec, 2017. 130(6):1237-1243  I recommend that you apply the Efudex cream every Saturday night. Use Premarin cream in the vagina on Monday, Wednesday and Friday night.   Return to see Dr Denman George in October, 2020 for repeat pap. Call in September  [phone] for an appointment.

## 2019-03-16 ENCOUNTER — Encounter: Payer: Self-pay | Admitting: Gynecologic Oncology

## 2019-03-16 NOTE — Progress Notes (Signed)
Consult Note: Gyn-Onc  Consult was requested by Dr. Gaetano Net for the evaluation of Abigail Marks 63 y.o. female  CC:  Chief Complaint  Patient presents with  . HSIL vaginal pap    Assessment/Plan:  Abigail Marks  is a 63 y.o.  year old with a longstanding history of lower genital tract dysplasia, now with HSIL pap.  We performed a biopsy of the vaginal cuff today. There were no grossly visible high grade lesions.  We will review the results of the biopsy. I recommend a course of vaginal efudex (I also offered the alternative of close follow-up with repeat pap in 6 months if today's biopsy is VAIN I or less).  She agreed to efudex therapy.   I spent time counseling Letti regarding the underlying etiology of her disease. 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   I will see Klynn back in 3 months after she has completed 2 months of efudex and will perform a repeat pap at that time.    HPI: [name] is a 63 year old parous woman who was seen in consultation at the request of Dr Gaetano Net for evaluation of an HSIL vaginal pap in the setting of longstanding history of lower genital tract dysplasia.  The patient reported having abnormal paps since her 35's. She underwent a total hysterectomy with BSO in her 80's for menorrhagia and cervical dysplasia. She denies cancer in the pathology.  Following hysterectomy she continued to have abnormal paps.  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 Follow-up colposcopy with biopsies showed no visible lesions. Biopsy of the right upper vaginal sidewall revealed atrophic squamous mucosa.   The patient is a former smoker. She has had 2 prior vaginal deliveries.      Current Meds:  Outpatient Encounter Medications as of 03/15/2019  Medication Sig  . Calcium Carbonate-Vitamin D (CALCIUM 600+D HIGH POTENCY) 600-400 MG-UNIT per tablet Take 1 or 2 capsules by mouth daily.   Marland Kitchen FLUoxetine (PROZAC) 20 MG tablet TAKE 2 TABLETS(40 MG) BY MOUTH DAILY  . Fluoxetine HCl, PMDD, 10 MG TABS Take 1 tablet (10 mg total) by mouth daily. Please ask patient to make an appointment for physical exam prior to further refills.  Marland Kitchen levothyroxine (SYNTHROID) 150 MCG tablet TAKE 1 TABLET BY MOUTH ONCE DAILY BEFORE BREAKFAST EXCEPT 1/2 TABLET ON SUNDAYS AND WEDNESDAYS. 6 TABLETS PER WEEK...NEEDS TO SCHEDULE PHYSICAL EXAM  . metoprolol tartrate (LOPRESSOR) 25 MG tablet TAKE 1/2 TO 1 TABLET BY MOUTH TWICE DAILY  . Multiple Vitamin (MULTIVITAMIN) tablet Take 1 tablet by mouth daily.    . Omega-3 Fatty Acids (FISH OIL PO) Take by mouth.  . primidone (MYSOLINE) 50 MG tablet Take 1 tablet (50 mg total) by mouth at bedtime.  . simvastatin (ZOCOR) 20 MG tablet Take 1 tablet (20 mg total) by mouth daily.  . TURMERIC PO Take by mouth daily.  Marland Kitchen conjugated estrogens (PREMARIN) vaginal cream Place 1 Applicatorful vaginally 3 (three) times a week.  . fluorouracil (EFUDEX) 5 % cream Apply topically once a week. Apply to vagina (1 applicator full) at night before bed. Wash off vulva tissue the following morning.  . [DISCONTINUED] conjugated estrogens (PREMARIN) vaginal cream Place 1  Applicatorful vaginally 3 (three) times a week.  . [DISCONTINUED] fluorouracil (EFUDEX) 5 % cream Apply topically once a week. Apply to vagina (1 applicator full) at night before bed. Wash off vulva tissue the following morning.   No facility-administered encounter medications on file as of 03/15/2019.     Allergy:  Allergies  Allergen Reactions  . Atorvastatin     REACTION: muscle pain  . Bupropion Hcl     REACTION: decreased  BP  . Ezetimibe-Simvastatin     REACTION: nausea  . Lunesta [Eszopiclone] Other (See  Comments)    Lack of effect  . Mirtazapine     REACTION: swelling  . Nsaids Other (See Comments)    gi  . Omeprazole-Sodium Bicarbonate     REACTION: GI upset  . Paroxetine     REACTION: felt bad  . Raloxifene     REACTION: leg pain  . Rosuvastatin     REACTION: increased ALT  . Temazepam     nightmares  . Trazodone And Nefazodone     nightmares  . Zolpidem Tartrate     intolerant    Social Hx:   Social History   Socioeconomic History  . Marital status: Widowed    Spouse name: Not on file  . Number of children: 2  . Years of education: Not on file  . Highest education level: Associate degree: academic program  Occupational History  . 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 Financial resource strain: Not on file  . Food insecurity    Worry: Not on file    Inability: Not on file  . Transportation needs    Medical: Not on file    Non-medical: Not on file  Tobacco Use  . Smoking status: Former Smoker    Quit date: 08/17/2004    Years since quitting: 14.5  . Smokeless tobacco: Never Used  Substance and Sexual Activity  . Alcohol use: Yes    Alcohol/week: 5.0 standard drinks    Types: 5 Cans of beer per week  . Drug use: No  . Sexual activity: Not on file  Lifestyle  . Physical activity    Days per week: Not on file    Minutes per session: Not on file  . Stress: Not on file  Relationships  . Social Herbalist on phone: Not on file    Gets together: Not on file    Attends religious service: Not on file    Active member of club or organization: Not on file    Attends meetings of clubs or organizations: Not on file    Relationship status: Not on file  . Intimate partner violence    Fear of current or ex partner: Not on file    Emotionally abused: Not on file    Physically abused: Not on file    Forced sexual activity: Not on file  Other Topics Concern  . 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 ABDOMINAL HYSTERECTOMY     Partial,prolapse  . BLADDER SUSPENSION  03/2005  . CATARACT EXTRACTION W/PHACO Right 11/11/2015   Procedure: CATARACT EXTRACTION PHACO AND INTRAOCULAR LENS PLACEMENT (IOC);  Surgeon: Estill Cotta, MD;  Location: [facility] ORS;  Service: Ophthalmology;  Laterality: Right;  [place] 00:55 AP% 23.0 CDE 22.84  fluid pack lot # [id number]  . CATARACT EXTRACTION W/PHACO Left 11/01/2015   Procedure: CATARACT EXTRACTION PHACO AND INTRAOCULAR LENS PLACEMENT (IOC);  Surgeon: Estill Cotta, MD;  Location: [facility] ORS;  Service: Ophthalmology;  Laterality: Left;  [place]            1:05 AP%        23 CDE       28.79       fluid casette lot # [id number]  exp5/31/2018  . CHOLECYSTECTOMY    . OOPHORECTOMY     bilateral cysts    Past Medical Hx:  Past Medical History:  Diagnosis Date  . Anemia 03/2010  . Arthritis   . Complication of anesthesia   . Depression   . GERD (gastroesophageal reflux disease)   . Herniated lumbar intervertebral disc 10/2000   Injections  . History of hiatal hernia   . Hyperlipidemia   . Hypothyroidism   . PONV (postoperative nausea and vomiting)   . Thyroid disease    Hypothyroidism s/p radioactive ablation  . Tremor     Past Gynecological History:  Lower genital tract dysplasia since 20's No LMP recorded. Patient has had a hysterectomy.  Family Hx:  Family History  Problem Relation Age of Onset  . Diabetes Mother   . Hypertension Mother   . Cancer Mother        breast  . Heart disease Mother        MI  . Breast cancer Mother   . Cancer Sister        Breast  . Breast cancer Sister   . Heart disease Sister   . Hypertension Son   . Kidney disease Son   . Healthy Daughter   . Colon cancer Neg Hx     Review of Systems:  Constitutional  Feels well,   ENT Normal appearing ears and nares bilaterally Skin/Breast   No rash, sores, jaundice, itching, dryness Cardiovascular  No chest pain, shortness of breath, or edema  Pulmonary  No cough or wheeze.  Gastro Intestinal  No nausea, vomitting, or diarrhoea. No bright red blood per rectum, no abdominal pain, change in bowel movement, or constipation.  Genito Urinary  No frequency, urgency, dysuria, no bleeding or discharge Musculo Skeletal  No myalgia, arthralgia, joint swelling or pain  Neurologic  No weakness, numbness, change in gait,  Psychology  No depression, anxiety, insomnia.   Vitals:  Blood pressure (!) 158/60, pulse 73, temperature 98.6 F (37 C), temperature source Oral, resp. rate 18, height 5\' 4"  (1.626 m), weight 141 lb 12.8 oz (64.3 kg), SpO2 100 %.  Physical Exam: WD in NAD Neck  Supple NROM, without any enlargements.  Lymph Node Survey No cervical supraclavicular or inguinal adenopathy Cardiovascular  Pulse normal rate, regularity and rhythm. S1 and S2 normal.  Lungs  Clear to auscultation bilateraly, without wheezes/crackles/rhonchi. Good air movement.  Skin  No rash/lesions/breakdown  Psychiatry  Alert and oriented to person, place, and time  Abdomen  Normoactive bowel sounds, abdomen soft, non-tender and thin without evidence of hernia.  Back No CVA tenderness Genito Urinary  Vulva/vagina: Normal external female genitalia. No lesions. No discharge or bleeding.  Bladder/urethra:  No lesions or masses, well supported bladder  Vagina: there was slight erythema after application of acetic acid to the right fornix. This was biopsied.   Cervix and uterus surgically absent  Adnexa: no palpable masses. Rectal  deferred Extremities  No bilateral cyanosis, clubbing or edema.  Procedure Note:  Preop  Dx: HSIL pap Postop Dx: same Procedure: vaginal biopsy Surgeon: Dorann Ou, MD EBL: scant Specimens: right vaginal fornix Complications: none Procedure Details: the patient provided verbal consetnt and verbal time out  performed. The speculum was placed and the entire vagina visualized/inspected with 4% acetic acid. An area of erythema was seen at the puckering folds of the right fornix (1cm from the biopsy site from previous biopsy). This was sampled with a tischler forcep.  The specimen was sent for histopathology. hemostsis was created with silver nitrate. The patient tolerated the procedure well.    Thereasa Solo, MD  03/16/2019, 5:27 PM

## 2019-03-22 ENCOUNTER — Telehealth: Payer: Self-pay

## 2019-03-22 NOTE — Telephone Encounter (Signed)
Told Ms Grandpre that the pathology showed low grade dysplasia.  The goal of the Effudex treatment to take care of the dysplasia per Joylene John, NP. Pt verbalized understanding.

## 2019-04-04 ENCOUNTER — Other Ambulatory Visit: Payer: Self-pay | Admitting: Family Medicine

## 2019-04-05 ENCOUNTER — Telehealth: Payer: Self-pay | Admitting: Family Medicine

## 2019-04-05 DIAGNOSIS — N893 Dysplasia of vagina, unspecified: Secondary | ICD-10-CM

## 2019-04-05 NOTE — Telephone Encounter (Signed)
Please schedule patient for CPE when possible. Thank you. refill sent

## 2019-04-05 NOTE — Telephone Encounter (Signed)
Patient schedule CPE/Labs. She would like to make sure that the order is in there for a CBC and Metb

## 2019-04-06 NOTE — Telephone Encounter (Signed)
Pt scheduled for labs 05/29/19 and CPE 06/01/19

## 2019-04-07 ENCOUNTER — Ambulatory Visit
Admission: RE | Admit: 2019-04-07 | Discharge: 2019-04-07 | Disposition: A | Discharge status: Home / Self Care | Payer: 59 | Source: Ambulatory Visit | Attending: Orthopedic Surgery | Admitting: Orthopedic Surgery

## 2019-04-07 ENCOUNTER — Other Ambulatory Visit: Payer: Self-pay

## 2019-04-07 DIAGNOSIS — M25511 Pain in right shoulder: Secondary | ICD-10-CM

## 2019-04-07 MED ORDER — IOPAMIDOL (ISOVUE-M 200) INJECTION 41%
12.0000 mL | Freq: Once | INTRAMUSCULAR | Status: AC
  Administered 2019-04-07: 12 mL via INTRA_ARTICULAR

## 2019-04-10 ENCOUNTER — Telehealth: Payer: Self-pay | Admitting: Orthopedic Surgery

## 2019-04-10 ENCOUNTER — Other Ambulatory Visit: Payer: Self-pay | Admitting: Family Medicine

## 2019-04-10 DIAGNOSIS — E039 Hypothyroidism, unspecified: Secondary | ICD-10-CM

## 2019-04-10 DIAGNOSIS — E785 Hyperlipidemia, unspecified: Secondary | ICD-10-CM

## 2019-04-10 NOTE — Telephone Encounter (Signed)
Patient says she is on chemo and no lab work has been done in some time so she just wants to keep an eye on her [place], etc.

## 2019-04-10 NOTE — Telephone Encounter (Signed)
Patient asking to be called with MRI results of shoulder.

## 2019-04-10 NOTE — Telephone Encounter (Signed)
I need a symptom listed to order the CBC.  It has to be something other than just a physical.  Thanks.

## 2019-04-10 NOTE — Telephone Encounter (Signed)
Pt called in said she would like to get her results over the phone if that's okay with you if not she would schedule an appt.   [phone]

## 2019-04-11 ENCOUNTER — Telehealth: Payer: Self-pay | Admitting: Orthopedic Surgery

## 2019-04-11 ENCOUNTER — Other Ambulatory Visit: Payer: Self-pay | Admitting: Family Medicine

## 2019-04-11 NOTE — Telephone Encounter (Signed)
Please advise. Thanks.  

## 2019-04-11 NOTE — Telephone Encounter (Signed)
Patient called requesting pain medication for her shoulder.  Patient uses [facility] on [address] in [place].  CB#[phone].  Thank you.

## 2019-04-12 NOTE — Addendum Note (Signed)
Addended by: Tonia Ghent on: 04/12/2019 01:32 PM   Modules accepted: Orders

## 2019-04-12 NOTE — Telephone Encounter (Signed)
My understanding is that she is referencing effudex but not systemic chemo. That said, I added the CBC.  Thanks.

## 2019-04-12 NOTE — Telephone Encounter (Signed)
I called.

## 2019-04-12 NOTE — Telephone Encounter (Signed)
I called.  No real AC joint tenderness on last exam.  MRI scan shows AC joint arthritis mild glenohumeral arthritis and to my reading possible anterior superior labral pathology.  She does have pain radiating to the biceps.  Decision point on Friday is for or against intra-articular shoulder joint injection versus arthroscopy with biceps tenodesis

## 2019-04-13 NOTE — Telephone Encounter (Signed)
It is the Effudex per patient.

## 2019-04-14 ENCOUNTER — Ambulatory Visit (INDEPENDENT_AMBULATORY_CARE_PROVIDER_SITE_OTHER): Payer: 59 | Admitting: Orthopedic Surgery

## 2019-04-14 ENCOUNTER — Encounter: Payer: Self-pay | Admitting: Orthopedic Surgery

## 2019-04-14 DIAGNOSIS — S46111S Strain of muscle, fascia and tendon of long head of biceps, right arm, sequela: Secondary | ICD-10-CM | POA: Diagnosis not present

## 2019-04-14 DIAGNOSIS — M810 Age-related osteoporosis without current pathological fracture: Secondary | ICD-10-CM | POA: Diagnosis not present

## 2019-04-14 MED ORDER — LIDOCAINE 1.8 % EX PTCH: 1.0000 | MEDICATED_PATCH | Freq: Every day | CUTANEOUS | 0 refills | Status: DC

## 2019-04-15 ENCOUNTER — Encounter: Payer: Self-pay | Admitting: Orthopedic Surgery

## 2019-04-15 NOTE — Progress Notes (Signed)
Office Visit Note   Patient: Abigail Marks           Date of Birth: [birth year]           MRN: [id number] Visit Date: 04/14/2019 Requested by: Tonia Ghent, MD [place],  [place] [address] PCP: Tonia Ghent, MD  Subjective: Chief Complaint  Patient presents with  . Follow-up    HPI: Abigail Marks is a patient with right shoulder pain.  Since have seen her she is had an MRI scan.  She does have some AC joint degenerative changes but no real symptoms in that region.  Also early glenohumeral arthritis which could be accounting for some of her night pain.  Rotator cuff was intact.  To my reading I think she has more of an anterior superior labral tear.  Radiologist felt like it was more posterior.  Subacromial injection did not give her any help.  Tylenol also has not been helpful.  She cannot take anti-inflammatories because it makes her sick.  Hurts to lift her granddaughter out of the playpen.  She does live in [place].  She is not too keen on physical therapy.              ROS: All systems reviewed are negative as they relate to the chief complaint within the history of present illness.  Patient denies  fevers or chills.   Assessment & Plan: Visit Diagnoses:  1. 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  Rotator cuff is intact.  Not much bursitis.  AC joint is arthritic but not really particularly symptomatic to exam.  We talked about an intra-articular glenohumeral joint injection which I think would be a logical next step but she wants to avoid injections for now.  We will try lidocaine patch.  If that does not help injection can follow.  I will see her back as needed.  Follow-Up Instructions: Return if  symptoms worsen or fail to improve.   Orders:  No orders of the defined types were placed in this encounter.  Meds ordered this encounter  Medications  . Lidocaine 1.8 % PTCH    Sig: Apply 1 patch topically daily.    Dispense:  30 patch    Refill:  0      Procedures: No procedures performed   Clinical Data: No additional findings.  Objective: Vital Signs: There were no vitals taken for this visit.  Physical Exam:   Constitutional: Patient appears well-developed HEENT:  Head: Normocephalic Eyes:EOM are normal Neck: Normal range of motion Cardiovascular: Normal rate Pulmonary/chest: Effort normal Neurologic: Patient is alert Skin: Skin is warm Psychiatric: Patient has normal mood and affect    Ortho Exam: Ortho exam demonstrates full active and passive range of motion of the right shoulder.  No discrete AC joint tenderness to direct palpation.  Equivocal O'Brien's testing on the right negative on the left.  Rotator cuff strength is excellent and there is no restriction of passive range of motion in that right shoulder.  Specialty Comments:  No specialty comments available.  Imaging: No results found.   PMFS History: Patient Active Problem List   Diagnosis Date Noted  . Dizziness 01/27/2017  . Hyperglycemia 10/19/2016  . GERD (gastroesophageal  reflux disease) 07/06/2014  . Advance care planning 04/05/2014  . Routine general medical examination at a health care facility 12/25/2012  . Elevated blood pressure reading without diagnosis of hypertension 01/01/2012  . Tremor 03/25/2011  . UNSPECIFIED PTOSIS OF EYELID 09/28/2007  . Hypothyroidism 02/03/2007  . HLD (hyperlipidemia) 02/03/2007  . Depression 02/03/2007  . IBS 02/03/2007  . INSOMNIA 02/03/2007   Past Medical History:  Diagnosis Date  . Anemia 03/2010  . Arthritis   . Complication of anesthesia   . Depression   . GERD (gastroesophageal reflux disease)   . Herniated lumbar intervertebral disc  10/2000   Injections  . History of hiatal hernia   . Hyperlipidemia   . Hypothyroidism   . PONV (postoperative nausea and vomiting)   . Thyroid disease    Hypothyroidism s/p radioactive ablation  . Tremor     Family History  Problem Relation Age of Onset  . Diabetes Mother   . Hypertension Mother   . Cancer Mother        breast  . Heart disease Mother        MI  . Breast cancer Mother   . Cancer Sister        Breast  . Breast cancer Sister   . Heart disease Sister   . Hypertension Son   . Kidney disease Son   . Healthy Daughter   . Colon cancer Neg Hx     Past Surgical History:  Procedure Laterality Date  . ABDOMINAL HYSTERECTOMY     Partial,prolapse  . BLADDER SUSPENSION  03/2005  . CATARACT EXTRACTION W/PHACO Right 11/11/2015   Procedure: CATARACT EXTRACTION PHACO AND INTRAOCULAR LENS PLACEMENT (IOC);  Surgeon: Estill Cotta, MD;  Location: [facility] ORS;  Service: Ophthalmology;  Laterality: Right;  [place] 00:55 AP% 23.0 CDE 22.84 fluid pack lot # [id number]  . CATARACT EXTRACTION W/PHACO Left 11/01/2015   Procedure: CATARACT EXTRACTION PHACO AND INTRAOCULAR LENS PLACEMENT (IOC);  Surgeon: Estill Cotta, MD;  Location: [facility] ORS;  Service: Ophthalmology;  Laterality: Left;  [place]            1:05 AP%        23 CDE       28.79       fluid casette lot # [id number]  exp5/31/2018  . CHOLECYSTECTOMY    . OOPHORECTOMY     bilateral cysts   Social History   Occupational History  . 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 Smoking status: Former Smoker    Quit date: 08/17/2004    Years since quitting: 14.6  . Smokeless tobacco: Never Used  Substance and Sexual Activity  . Alcohol use: Yes    Alcohol/week: 5.0 standard drinks    Types: 5 Cans of beer per week  . Drug use: No  . Sexual activity: Not on file

## 2019-04-21 DIAGNOSIS — M810 Age-related osteoporosis without current pathological fracture: Secondary | ICD-10-CM | POA: Diagnosis not present

## 2019-05-25 ENCOUNTER — Telehealth: Payer: Self-pay

## 2019-05-25 NOTE — Telephone Encounter (Signed)
LVM w COVID screen, back lab and front door info

## 2019-05-29 ENCOUNTER — Other Ambulatory Visit: Payer: Self-pay

## 2019-05-29 ENCOUNTER — Other Ambulatory Visit (INDEPENDENT_AMBULATORY_CARE_PROVIDER_SITE_OTHER): Payer: 59

## 2019-05-29 DIAGNOSIS — N893 Dysplasia of vagina, unspecified: Secondary | ICD-10-CM

## 2019-05-29 DIAGNOSIS — E039 Hypothyroidism, unspecified: Secondary | ICD-10-CM

## 2019-05-29 DIAGNOSIS — E785 Hyperlipidemia, unspecified: Secondary | ICD-10-CM

## 2019-05-29 LAB — COMPREHENSIVE METABOLIC PANEL
ALT: 24 U/L (ref 0–35)
AST: 22 U/L (ref 0–37)
Albumin: 4.3 g/dL (ref 3.5–5.2)
Alkaline Phosphatase: 56 U/L (ref 39–117)
BUN: 13 mg/dL (ref 6–23)
CO2: 28 mEq/L (ref 19–32)
Calcium: 8.4 mg/dL (ref 8.4–10.5)
Chloride: 102 mEq/L (ref 96–112)
Creatinine, Ser: 0.69 mg/dL (ref 0.40–1.20)
GFR: 85.44 mL/min (ref >60.00)
Glucose, Bld: 89 mg/dL (ref 70–99)
Potassium: 3.7 mEq/L (ref 3.5–5.1)
Sodium: 138 mEq/L (ref 135–145)
Total Bilirubin: 0.4 mg/dL (ref 0.2–1.2)
Total Protein: 6.9 g/dL (ref 6.0–8.3)

## 2019-05-29 LAB — CBC WITH DIFFERENTIAL/PLATELET
Basophils Absolute: 0.1 10*3/uL (ref 0.0–0.1)
Basophils Relative: 0.9 % (ref 0.0–3.0)
Eosinophils Absolute: 0.1 10*3/uL (ref 0.0–0.7)
Eosinophils Relative: 1.4 % (ref 0.0–5.0)
HCT: 37.9 % (ref 36.0–46.0)
Hemoglobin: 12.3 g/dL (ref 12.0–15.0)
Lymphocytes Relative: 24.1 % (ref 12.0–46.0)
Lymphs Abs: 1.8 10*3/uL (ref 0.7–4.0)
MCHC: 32.4 g/dL (ref 30.0–36.0)
MCV: 94.3 fl (ref 78.0–100.0)
Monocytes Absolute: 0.7 10*3/uL (ref 0.1–1.0)
Monocytes Relative: 9.4 % (ref 3.0–12.0)
Neutro Abs: 4.8 10*3/uL (ref 1.4–7.7)
Neutrophils Relative %: 64.2 % (ref 43.0–77.0)
Platelets: 166 10*3/uL (ref 150.0–400.0)
RBC: 4.02 Mil/uL (ref 3.87–5.11)
RDW: 12.4 % (ref 11.5–15.5)
WBC: 7.4 10*3/uL (ref 4.0–10.5)

## 2019-05-29 LAB — LIPID PANEL
Cholesterol: 153 mg/dL (ref 0–200)
HDL: 58.6 mg/dL (ref >39.00)
LDL Cholesterol: 78 mg/dL (ref 0–99)
NonHDL: 94.64
Total CHOL/HDL Ratio: 3
Triglycerides: 81 mg/dL (ref 0.0–149.0)
VLDL: 16.2 mg/dL (ref 0.0–40.0)

## 2019-05-29 LAB — TSH: TSH: 0.44 u[IU]/mL (ref 0.35–4.50)

## 2019-06-01 ENCOUNTER — Other Ambulatory Visit: Payer: Self-pay

## 2019-06-01 ENCOUNTER — Encounter: Payer: Self-pay | Admitting: Family Medicine

## 2019-06-01 ENCOUNTER — Ambulatory Visit (INDEPENDENT_AMBULATORY_CARE_PROVIDER_SITE_OTHER): Payer: 59 | Admitting: Family Medicine

## 2019-06-01 VITALS — BP 122/68 | HR 66 | Temp 97.7°F | Ht 62.5 in | Wt 137.4 lb

## 2019-06-01 DIAGNOSIS — Z0000 Encounter for general adult medical examination without abnormal findings: Secondary | ICD-10-CM

## 2019-06-01 DIAGNOSIS — E785 Hyperlipidemia, unspecified: Secondary | ICD-10-CM

## 2019-06-01 DIAGNOSIS — Z7189 Other specified counseling: Secondary | ICD-10-CM

## 2019-06-01 DIAGNOSIS — Z23 Encounter for immunization: Secondary | ICD-10-CM | POA: Diagnosis not present

## 2019-06-01 DIAGNOSIS — E039 Hypothyroidism, unspecified: Secondary | ICD-10-CM

## 2019-06-01 MED ORDER — FLUOXETINE HCL 20 MG PO TABS: ORAL_TABLET | ORAL | 3 refills | Status: DC

## 2019-06-01 MED ORDER — LEVOTHYROXINE SODIUM 150 MCG PO TABS: ORAL_TABLET | ORAL | 3 refills | Status: DC

## 2019-06-01 NOTE — Progress Notes (Signed)
CPE- See plan.  Routine anticipatory guidance given to patient.  See health maintenance.  The possibility exists that previously documented standard health maintenance information may have been brought forward from a previous encounter into this note.  If needed, that same information has been updated to reflect the current situation based on today's encounter.    Pap per gyn, Dr. [facility] Fey in [place] [facility]  I'll defer.  she agrees.   DXA done per gyn.  Osteoporosis. Per gyn, I'll defer.   Mammogram up to date Living will d/w pt. Abigail Marks would be designated if patient were incapacitated.  Tetanus 2014 Flu shot 2020 PNA not due yet.  Shingrix d/w pt.  Diet and exercise d/w pt. "I'm doing alright."   HIV done prev at GYN clinic. HCV screening prev done. Colonoscopy 2011.     Off Efudex with Gyn f/u pending.   Shoulder pain per ortho.  I'll defer.  She agrees.  She is putting up with this.  She did not think this was related to statin use.  Elevated Cholesterol: Using medications without problems: yes Muscle aches: some shoulder pain, see above.   Diet compliance: yes Exercise:yes Labs d/w pt.  lipids reasonable.    Hypothyroidism.  Compliant.  No ADE on med.  No neck mass or dysphagia.  Labs d/w pt.  TSH wnl.   Tremor.  On primidone.  No ADE on med.   Improved on med.   She is moving into a condo in the near future.    PMH and SH reviewed  Meds, vitals, and allergies reviewed.   ROS: Per HPI.  Unless specifically indicated otherwise in HPI, the patient denies:  General: fever. Eyes: acute vision changes ENT: sore throat Cardiovascular: chest pain Respiratory: SOB GI: vomiting GU: dysuria Musculoskeletal: acute back pain Derm: acute rash Neuro: acute motor dysfunction Psych: worsening mood Endocrine: polydipsia Heme: bleeding Allergy: hayfever  GEN: nad, alert and oriented HEENT: ncat NECK: supple w/o LA CV: rrr. PULM: ctab, no inc  wob ABD: soft, +bs EXT: no edema SKIN: no acute rash

## 2019-06-01 NOTE — Patient Instructions (Signed)
Thanks for getting a flu shot.  Udpate me as needed.   I'll await the notes from ortho.   Take care.  Glad to see you.  Check with your insurance to see if they will cover the shingrix shot.

## 2019-06-03 ENCOUNTER — Other Ambulatory Visit: Payer: Self-pay | Admitting: Family Medicine

## 2019-06-04 NOTE — Assessment & Plan Note (Signed)
Living will d/w pt. Abigail Marks would be designated if patient were incapacitated.

## 2019-06-04 NOTE — Assessment & Plan Note (Signed)
Pap per gyn, Dr. Gertie Fey in [place] [facility]  I'll defer.  she agrees.   DXA done per gyn.  Osteoporosis. Per gyn, I'll defer.   Mammogram up to date Living will d/w pt. Abigail Marks would be designated if patient were incapacitated.  Tetanus 2014 Flu shot 2020 PNA not due yet.  Shingrix d/w pt.  Diet and exercise d/w pt. "I'm doing alright."   HIV done prev at GYN clinic. HCV screening prev done. Colonoscopy 2011.

## 2019-06-04 NOTE — Assessment & Plan Note (Signed)
Compliant.  No ADE on med.  No neck mass or dysphagia.  Labs d/w pt.  TSH wnl.

## 2019-06-04 NOTE — Assessment & Plan Note (Signed)
lipids reasonable.  Continue statin for now.  Update me as needed.  She agrees.  Labs discussed with patient.

## 2019-06-06 ENCOUNTER — Other Ambulatory Visit: Payer: Self-pay

## 2019-06-06 ENCOUNTER — Inpatient Hospital Stay: Payer: 59 | Attending: Gynecologic Oncology | Admitting: Gynecologic Oncology

## 2019-06-06 ENCOUNTER — Encounter: Payer: Self-pay | Admitting: Gynecologic Oncology

## 2019-06-06 ENCOUNTER — Other Ambulatory Visit (HOSPITAL_COMMUNITY)
Admission: RE | Admit: 2019-06-06 | Discharge: 2019-06-06 | Disposition: A | Discharge status: Home / Self Care | Payer: 59 | Source: Ambulatory Visit | Attending: Gynecologic Oncology | Admitting: Gynecologic Oncology

## 2019-06-06 VITALS — BP 143/59 | HR 61 | Temp 98.2°F | Resp 18 | Ht 62.5 in | Wt 137.2 lb

## 2019-06-06 DIAGNOSIS — F329 Major depressive disorder, single episode, unspecified: Secondary | ICD-10-CM | POA: Insufficient documentation

## 2019-06-06 DIAGNOSIS — Z79899 Other long term (current) drug therapy: Secondary | ICD-10-CM | POA: Insufficient documentation

## 2019-06-06 DIAGNOSIS — E785 Hyperlipidemia, unspecified: Secondary | ICD-10-CM | POA: Insufficient documentation

## 2019-06-06 DIAGNOSIS — N893 Dysplasia of vagina, unspecified: Secondary | ICD-10-CM

## 2019-06-06 DIAGNOSIS — Z90710 Acquired absence of both cervix and uterus: Secondary | ICD-10-CM | POA: Insufficient documentation

## 2019-06-06 DIAGNOSIS — D072 Carcinoma in situ of vagina: Secondary | ICD-10-CM | POA: Insufficient documentation

## 2019-06-06 DIAGNOSIS — Z87411 Personal history of vaginal dysplasia: Secondary | ICD-10-CM | POA: Diagnosis not present

## 2019-06-06 DIAGNOSIS — K219 Gastro-esophageal reflux disease without esophagitis: Secondary | ICD-10-CM | POA: Insufficient documentation

## 2019-06-06 DIAGNOSIS — E039 Hypothyroidism, unspecified: Secondary | ICD-10-CM | POA: Insufficient documentation

## 2019-06-06 DIAGNOSIS — Z87891 Personal history of nicotine dependence: Secondary | ICD-10-CM | POA: Insufficient documentation

## 2019-06-06 DIAGNOSIS — M1990 Unspecified osteoarthritis, unspecified site: Secondary | ICD-10-CM | POA: Insufficient documentation

## 2019-06-06 DIAGNOSIS — Z90722 Acquired absence of ovaries, bilateral: Secondary | ICD-10-CM | POA: Insufficient documentation

## 2019-06-06 NOTE — Progress Notes (Signed)
Follow-up note: Gyn-Onc  Consult was requested by Dr. Gaetano Net for the evaluation of Abigail Marks 63 y.o. female  CC:  Chief Complaint  Patient presents with  . Vaginal dysplasia    Assessment/Plan:  [name]  is a 63 y.o.  year old with a longstanding history of lower genital tract dysplasia. She is s/p vaginal effudex for HSIL pap between August/September, 2020.  Biopsy today of erythematous tissue. If this is benign, she can follow-up with repeat pap with Dr Gaetano Net in 12 months. If this is premalignant or malignant, she should have consideration for vaginal laser.   HPI: [name] is a 63 year old parous woman who was seen in consultation at the request of Dr Gaetano Net for evaluation of an HSIL vaginal pap in the setting of longstanding history of lower genital tract dysplasia.  The patient reported having abnormal paps since her 24's. She underwent a total hysterectomy with BSO in her 34's for menorrhagia and cervical dysplasia. She denies cancer in the pathology.  Following hysterectomy she continued to have abnormal paps.  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 Follow-up colposcopy with biopsies showed no visible lesions. Biopsy of the right upper vaginal sidewall revealed atrophic squamous mucosa.   The patient is a former smoker. She has had 2 prior vaginal deliveries.   Interval Hx:  She was treated with vaginal 5FU between September and August 2020 for treatment of a H SIL Pap that was performed in July 2020.  Prior to starting that treatment she had undergone a colposcopy with me with vaginal biopsy from the right vaginal fornix which showed atrophic squamous mucosa no evident evidence of malignancy or dysplasia.  After completing Efudex she began experiencing intermittent vaginal bleeding.  She is using Premarin cream 3 times a week in the vagina.  Current Meds:  Outpatient Encounter Medications as of  06/06/2019  Medication Sig  . Calcium Carbonate-Vitamin D (CALCIUM 600+D HIGH POTENCY) 600-400 MG-UNIT per tablet Take 1 or 2 capsules by mouth daily.   Marland Kitchen conjugated estrogens (PREMARIN) vaginal cream Place 1 Applicatorful vaginally 3 (three) times a week.  . denosumab (PROLIA) 60 MG/ML SOSY injection Every 6 months  . FLUoxetine (PROZAC) 20 MG tablet TAKE 2 TABLETS(40 MG) BY MOUTH DAILY  . levothyroxine (SYNTHROID) 150 MCG tablet TAKE 1 TABLET BY MOUTH ONCE DAILY BEFORE BREAKFAST EXCEPT 1/2 TABLET ON SUNDAYS AND WEDNESDAYS. 6 TABLETS PER WEEK  . Multiple Vitamin (MULTIVITAMIN) tablet Take 1 tablet by mouth daily.    . Omega-3 Fatty Acids (FISH OIL PO) Take by mouth.  . primidone (MYSOLINE) 50 MG tablet Take 1 tablet (50 mg total) by mouth at bedtime.  . simvastatin (ZOCOR) 20 MG tablet TAKE 1 TABLET(20 MG) BY MOUTH DAILY  . [DISCONTINUED] Fluoxetine HCl, PMDD, 10 MG TABS Take 1 tablet (10 mg total) by mouth daily. Please ask patient to make an appointment for physical exam prior to further refills.   No facility-administered encounter medications on file as of 06/06/2019.     Allergy:  Allergies  Allergen Reactions  . Atorvastatin     REACTION: muscle pain  . Bupropion Hcl     REACTION: decreased  BP  . Ezetimibe-Simvastatin     REACTION: nausea  . Lunesta [Eszopiclone] Other (See Comments)    Lack of effect  . Mirtazapine     REACTION: swelling  . Nsaids Other (See Comments)  gi  . Omeprazole-Sodium Bicarbonate     REACTION: GI upset  . Paroxetine     REACTION: felt bad  . Raloxifene     REACTION: leg pain  . Rosuvastatin     REACTION: increased ALT  . Temazepam     nightmares  . Trazodone And Nefazodone     nightmares  . Zolpidem Tartrate     intolerant    Social Hx:   Social History   Socioeconomic History  . Marital status: Widowed    Spouse name: Not on file  . Number of children: 2  . Years of education: Not on file  . Highest education level: Associate  degree: academic program  Occupational History  . 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 Financial resource strain: Not on file  . Food insecurity    Worry: Not on file    Inability: Not on file  . Transportation needs    Medical: Not on file    Non-medical: Not on file  Tobacco Use  . Smoking status: Former Smoker    Quit date: 08/17/2004    Years since quitting: 14.8  . Smokeless tobacco: Never Used  Substance and Sexual Activity  . Alcohol use: Yes    Alcohol/week: 5.0 standard drinks    Types: 5 Cans of beer per week    Comment: occ  . Drug use: No  . Sexual activity: Not on file  Lifestyle  . Physical activity    Days per week: Not on file    Minutes per session: Not on file  . Stress: Not on file  Relationships  . Social Herbalist on phone: Not on file    Gets together: Not on file    Attends religious service: Not on file    Active member of club or organization: Not on file    Attends meetings of clubs or organizations: Not on file    Relationship status: Not on file  . Intimate partner violence    Fear of current or ex partner: Not on file    Emotionally abused: Not on file    Physically abused: Not on file    Forced sexual activity: Not on file  Other Topics Concern  . 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 ABDOMINAL HYSTERECTOMY     Partial,prolapse  . BLADDER SUSPENSION  03/2005  . CATARACT EXTRACTION W/PHACO Right 11/11/2015   Procedure: CATARACT EXTRACTION PHACO AND INTRAOCULAR LENS PLACEMENT (IOC);  Surgeon: Estill Cotta, MD;  Location: [facility] ORS;  Service: Ophthalmology;  Laterality: Right;  [place] 00:55 AP% 23.0 CDE 22.84 fluid pack lot # [id number]  . CATARACT EXTRACTION W/PHACO Left 11/01/2015   Procedure: CATARACT EXTRACTION PHACO AND INTRAOCULAR LENS  PLACEMENT (IOC);  Surgeon: Estill Cotta, MD;  Location: [facility] ORS;  Service: Ophthalmology;  Laterality: Left;  [place]            1:05 AP%        23 CDE       28.79       fluid casette lot # [id number]  exp5/31/2018  . CHOLECYSTECTOMY    . OOPHORECTOMY     bilateral cysts    Past Medical Hx:  Past Medical History:  Diagnosis Date  . Anemia 03/2010  .  Arthritis   . Complication of anesthesia   . Depression   . GERD (gastroesophageal reflux disease)   . Herniated lumbar intervertebral disc 10/2000   Injections  . History of hiatal hernia   . Hyperlipidemia   . Hypothyroidism   . PONV (postoperative nausea and vomiting)   . Thyroid disease    Hypothyroidism s/p radioactive ablation  . Tremor     Past Gynecological History:  Lower genital tract dysplasia since 20's No LMP recorded. Patient has had a hysterectomy.  Family Hx:  Family History  Problem Relation Age of Onset  . Diabetes Mother   . Hypertension Mother   . Cancer Mother        breast  . Heart disease Mother        MI  . Breast cancer Mother   . Cancer Sister        Breast  . Breast cancer Sister   . Heart disease Sister   . Hypertension Son   . Kidney disease Son   . Healthy Daughter   . Colon cancer Neg Hx     Review of Systems:  Constitutional  Feels well,   ENT Normal appearing ears and nares bilaterally Skin/Breast  No rash, sores, jaundice, itching, dryness Cardiovascular  No chest pain, shortness of breath, or edema  Pulmonary  No cough or wheeze.  Gastro Intestinal  No nausea, vomitting, or diarrhoea. No bright red blood per rectum, no abdominal pain, change in bowel movement, or constipation.  Genito Urinary  No frequency, urgency, dysuria, no bleeding or discharge Musculo Skeletal  No myalgia, arthralgia, joint swelling or pain  Neurologic  No weakness, numbness, change in gait,  Psychology  No depression, anxiety, insomnia.   Vitals:  Blood pressure (!) 143/59, pulse 61, temperature  98.2 F (36.8 C), temperature source Temporal, resp. rate 18, height 5' 2.5" (1.588 m), weight 137 lb 4 oz (62.3 kg), SpO2 100 %.  Physical Exam: WD in NAD Neck  Supple NROM, without any enlargements.  Lymph Node Survey No cervical supraclavicular or inguinal adenopathy Cardiovascular  Pulse normal rate, regularity and rhythm. S1 and S2 normal.  Lungs  Clear to auscultation bilateraly, without wheezes/crackles/rhonchi. Good air movement.  Skin  No rash/lesions/breakdown  Psychiatry  Alert and oriented to person, place, and time  Abdomen  Normoactive bowel sounds, abdomen soft, non-tender and thin without evidence of hernia.  Back No CVA tenderness Genito Urinary  Vulva/vagina: Normal external female genitalia. No lesions. No discharge or bleeding.  Bladder/urethra:  No lesions or masses, well supported bladder  Vagina: There was erythema that was friable across the posterior upper vagina extending to the right vaginal sidewall.  There is no palpable mass or lesion discretely visible.  Cervix and uterus surgically absent  Adnexa: no palpable masses. Rectal  deferred Extremities  No bilateral cyanosis, clubbing or edema.  Procedure Note:  Preop Dx: VAIN, abnormal vaginal mucosa Postop Dx: same Procedure: vaginal biopsy Surgeon: Dorann Ou, MD EBL: scant Specimens: left posterior vaginal cuff Complications: none Procedure Details: the patient provided verbal consetnt and verbal time out performed. The speculum was placed and the entire vagina visualized/inspected.  The area of erythema was appreciated in the posterior vaginal fornix and upper vagina extending to the patient's left upper vaginal sidewall.  This was the area sent for a representative biopsy with a Tischler forcep. The specimen was sent for histopathology. hemostsis was created with silver nitrate. The patient tolerated the procedure well.    Lenard Simmer  Denman George, MD  06/06/2019, 3:56 PM

## 2019-06-06 NOTE — Patient Instructions (Signed)
Dr Denman George took a pap test and biopsy today.  Her office will contact you with results.  If both show no dysplasia or cancer, you can follow up in 1 year for repeat pap test with Dr Gaetano Net.  If they reveal dysplasia, Dr Denman George will discuss a procedure with you.   Continue to use premarin until the bleeding stops (at least).

## 2019-06-07 ENCOUNTER — Other Ambulatory Visit: Payer: Self-pay

## 2019-06-07 MED ORDER — PRIMIDONE 50 MG PO TABS: 50.0000 mg | ORAL_TABLET | Freq: Every day | ORAL | 3 refills | Status: DC

## 2019-06-08 LAB — SURGICAL PATHOLOGY

## 2019-06-09 ENCOUNTER — Telehealth: Payer: Self-pay

## 2019-06-09 LAB — CYTOLOGY - PAP: Diagnosis: NEGATIVE

## 2019-06-09 NOTE — Telephone Encounter (Signed)
I spoke with Abigail Marks and let her know that her pap showed healing tissue and her biopsy was negative. She verbalized understanding

## 2019-07-24 ENCOUNTER — Ambulatory Visit: Payer: 59 | Admitting: Neurology

## 2019-08-28 ENCOUNTER — Encounter: Payer: Self-pay | Admitting: Family Medicine

## 2019-08-30 ENCOUNTER — Other Ambulatory Visit: Payer: Self-pay | Admitting: Family Medicine

## 2019-08-30 ENCOUNTER — Encounter: Payer: Self-pay | Admitting: Family Medicine

## 2019-08-30 MED ORDER — FLUOXETINE HCL 20 MG PO CAPS: 40.0000 mg | ORAL_CAPSULE | Freq: Every day | ORAL | 3 refills | Status: DC

## 2019-08-30 NOTE — Telephone Encounter (Signed)
Ok to switch 

## 2019-10-03 ENCOUNTER — Other Ambulatory Visit: Payer: Self-pay

## 2019-10-03 MED ORDER — PRIMIDONE 50 MG PO TABS: 50.0000 mg | ORAL_TABLET | Freq: Every day | ORAL | 0 refills | Status: DC

## 2019-10-03 NOTE — Telephone Encounter (Signed)
Rx(s) sent to pharmacy electronically, note on rx advising patient to contact office for additional refills.  Message sent to scheduling to contact patient to reschedule follow up appt.

## 2019-10-31 ENCOUNTER — Other Ambulatory Visit: Payer: Self-pay

## 2019-10-31 MED ORDER — PRIMIDONE 50 MG PO TABS: 50.0000 mg | ORAL_TABLET | Freq: Every day | ORAL | 0 refills | Status: DC

## 2019-10-31 NOTE — Telephone Encounter (Signed)
Rx(s) sent to pharmacy electronically.  

## 2019-11-14 NOTE — Progress Notes (Signed)
Virtual Visit Via Video   The purpose of this virtual visit is to provide medical care while limiting exposure to the novel coronavirus.    Consent was obtained for video visit:  Yes.   Answered questions that patient had about telehealth interaction:  Yes.   I discussed the limitations, risks, security and privacy concerns of performing an evaluation and management service by telemedicine. I also discussed with the patient that there may be a patient responsible charge related to this service. The patient expressed understanding and agreed to proceed.  Pt location: Home Physician Location: home Name of referring provider:  Tonia Ghent, MD I connected with Abigail Marks at patients initiation/request on 11/15/2019 at 10:45 AM EDT by video enabled telemedicine application and verified that I am speaking with the correct person using two identifiers. Pt MRN:  [id number] Pt DOB:  [birth year] Video Participants:  Abigail Marks;    Assessment/Plan:   1.  Essential tremor  -Continue primidone, 50 mg daily  -refills given x 9 months  2.  F/u prn.  If doing well, PCP can take over refills if no objection.  Pt agrees  Subjective   Patient seen today in follow-up for essential tremor.  Primidone was started last visit.  She is on 50 mg daily.  Discussed last visit that I thought that counseling would help because I thought anxiety was driving her tremor.  She has had a lot of life events that were traumatic, including the death of her husband.  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  Medical records are reviewed since last visit.  She saw her primary care physician in October.  At that point in time, tremor was improved on primidone.  Today, she states that tremor is much improved but she notices it about 2 times per week, esp with drinking a drink or writing.   She isn't  wanting to increase med right now - happy with degree of tremor control  Current movement d/o meds:  Primidone, 50 mg daily Metoprolol, 12.5 mg as needed (unable to tolerate higher dosages)  Prior meds: none   Current Outpatient Medications on File Prior to Visit  Medication Sig Dispense Refill  . Calcium Carbonate-Vitamin D (CALCIUM 600+D HIGH POTENCY) 600-400 MG-UNIT per tablet Take 1 or 2 capsules by mouth daily.     Marland Kitchen conjugated estrogens (PREMARIN) vaginal cream Place 1 Applicatorful vaginally 3 (three) times a week. (Patient taking differently: Place 1 Applicatorful vaginally once a week. ) 42.5 g 12  . denosumab (PROLIA) 60 MG/ML SOSY injection Every 6 months    . FLUoxetine (PROZAC) 10 MG capsule Take 10 mg by mouth daily.    Marland Kitchen FLUoxetine (PROZAC) 20 MG capsule Take 2 capsules (40 mg total) by mouth daily. 180 capsule 3  . levothyroxine (SYNTHROID) 150 MCG tablet TAKE 1 TABLET BY MOUTH ONCE DAILY BEFORE BREAKFAST EXCEPT 1/2 TABLET ON SUNDAYS AND WEDNESDAYS. 6 TABLETS PER WEEK 90 tablet 3  . Multiple Vitamin (MULTIVITAMIN) tablet Take 1 tablet by mouth daily.      . simvastatin (ZOCOR) 20 MG tablet TAKE 1 TABLET(20 MG) BY MOUTH DAILY 90 tablet 3  . [DISCONTINUED] Fluoxetine HCl, PMDD, 10 MG TABS Take 1 tablet (10 mg total) by mouth daily. Please ask patient to make an appointment for physical exam prior to further refills. 30 tablet 0   No current facility-administered  medications on file prior to visit.     Objective   Vitals:   11/15/19 0919  Weight: 133 lb (60.3 kg)  Height: 5' 2.5" (1.588 m)   GEN:  The patient appears stated age and is in NAD.  Neurological examination:  Orientation: The patient is alert and oriented x3. Cranial nerves: There is good facial symmetry. There is no facial hypomimia.  The speech is fluent and clear. Soft palate rises symmetrically and there is no tongue deviation. Hearing is intact to conversational tone.   Movement examination: Tone:  unable Abnormal movements: no postural tremor or intention tremor noted today Coordination:  There is no decremation with RAM's     Follow up Instructions      -I discussed the assessment and treatment plan with the patient. The patient was provided an opportunity to ask questions and all were answered. The patient agreed with the plan and demonstrated an understanding of the instructions.   The patient was advised to call back or seek an in-person evaluation if the symptoms worsen or if the condition fails to improve as anticipated.     Alonza Bogus, DO

## 2019-11-15 ENCOUNTER — Telehealth (INDEPENDENT_AMBULATORY_CARE_PROVIDER_SITE_OTHER): Payer: Medicare Other | Admitting: Neurology

## 2019-11-15 ENCOUNTER — Other Ambulatory Visit: Payer: Self-pay

## 2019-11-15 ENCOUNTER — Encounter: Payer: Self-pay | Admitting: Neurology

## 2019-11-15 VITALS — Ht 62.5 in | Wt 133.0 lb

## 2019-11-15 DIAGNOSIS — G250 Essential tremor: Secondary | ICD-10-CM

## 2019-11-15 MED ORDER — PRIMIDONE 50 MG PO TABS: 50.0000 mg | ORAL_TABLET | Freq: Every day | ORAL | 2 refills | Status: DC

## 2020-01-13 ENCOUNTER — Other Ambulatory Visit: Payer: Self-pay | Admitting: Family Medicine

## 2020-02-27 ENCOUNTER — Other Ambulatory Visit: Payer: Self-pay

## 2020-02-27 ENCOUNTER — Emergency Department (HOSPITAL_COMMUNITY)
Admission: EM | Admit: 2020-02-27 | Discharge: 2020-02-27 | Disposition: A | Discharge status: Home / Self Care | Payer: Medicare Other | Attending: Emergency Medicine | Admitting: Emergency Medicine

## 2020-02-27 ENCOUNTER — Encounter (HOSPITAL_COMMUNITY): Payer: Self-pay

## 2020-02-27 ENCOUNTER — Telehealth: Payer: Self-pay

## 2020-02-27 ENCOUNTER — Emergency Department (HOSPITAL_COMMUNITY): Payer: Medicare Other

## 2020-02-27 DIAGNOSIS — K625 Hemorrhage of anus and rectum: Secondary | ICD-10-CM

## 2020-02-27 DIAGNOSIS — E039 Hypothyroidism, unspecified: Secondary | ICD-10-CM | POA: Diagnosis not present

## 2020-02-27 DIAGNOSIS — Z79890 Hormone replacement therapy (postmenopausal): Secondary | ICD-10-CM | POA: Insufficient documentation

## 2020-02-27 DIAGNOSIS — M545 Low back pain: Secondary | ICD-10-CM | POA: Diagnosis not present

## 2020-02-27 DIAGNOSIS — R109 Unspecified abdominal pain: Secondary | ICD-10-CM | POA: Diagnosis not present

## 2020-02-27 DIAGNOSIS — Z87891 Personal history of nicotine dependence: Secondary | ICD-10-CM | POA: Diagnosis not present

## 2020-02-27 LAB — COMPREHENSIVE METABOLIC PANEL
ALT: 19 U/L (ref 0–44)
AST: 25 U/L (ref 15–41)
Albumin: 4.3 g/dL (ref 3.5–5.0)
Alkaline Phosphatase: 48 U/L (ref 38–126)
Anion gap: 10 (ref 5–15)
BUN: 16 mg/dL (ref 8–23)
CO2: 29 mmol/L (ref 22–32)
Calcium: 8.8 mg/dL — ABNORMAL LOW (ref 8.9–10.3)
Chloride: 101 mmol/L (ref 98–111)
Creatinine, Ser: 0.76 mg/dL (ref 0.44–1.00)
GFR calc Af Amer: >60 mL/min (ref >60)
GFR calc non Af Amer: >60 mL/min (ref >60)
Glucose, Bld: 90 mg/dL (ref 70–99)
Potassium: 3.7 mmol/L (ref 3.5–5.1)
Sodium: 140 mmol/L (ref 135–145)
Total Bilirubin: 0.3 mg/dL (ref 0.3–1.2)
Total Protein: 7.3 g/dL (ref 6.5–8.1)

## 2020-02-27 LAB — CBC
HCT: 36.7 % (ref 36.0–46.0)
Hemoglobin: 12.2 g/dL (ref 12.0–15.0)
MCH: 32.4 pg (ref 26.0–34.0)
MCHC: 33.2 g/dL (ref 30.0–36.0)
MCV: 97.3 fL (ref 80.0–100.0)
Platelets: 168 10*3/uL (ref 150–400)
RBC: 3.77 MIL/uL — ABNORMAL LOW (ref 3.87–5.11)
RDW: 11.7 % (ref 11.5–15.5)
WBC: 7.1 10*3/uL (ref 4.0–10.5)
nRBC: 0 % (ref 0.0–0.2)

## 2020-02-27 LAB — POC OCCULT BLOOD, ED: Fecal Occult Bld: POSITIVE — AB

## 2020-02-27 LAB — TYPE AND SCREEN
ABO/RH(D): A POS
Antibody Screen: NEGATIVE

## 2020-02-27 MED ORDER — IOHEXOL 300 MG/ML  SOLN
100.0000 mL | Freq: Once | INTRAMUSCULAR | Status: AC | PRN
  Administered 2020-02-27: 100 mL via INTRAVENOUS

## 2020-02-27 MED ORDER — SODIUM CHLORIDE (PF) 0.9 % IJ SOLN
INTRAMUSCULAR | Status: AC
  Filled 2020-02-27: qty 50

## 2020-02-27 NOTE — ED Triage Notes (Signed)
Patient reports that she has had a total of 3 episodes of bright red blood and clots since yesterday. Patient states the last episode was nothing but blood and clots. Patient c/o low back pain as well.

## 2020-02-27 NOTE — Telephone Encounter (Signed)
Access nurse has triaged pt with rectal bleeding and advised pt should go to ED for eval now. Pt refusing and access nurse transferred pt to [facility]. I spoke with pt; pt said on 02/26/20 pt had moderate amt of blood and clots in commode with constipated BM; this morning same thing occurred with moderate amt of bleeding. Pt thinks she can feel 1 external hemorrhoid when pt wipes. There is bright red blood on tissue and this afternoon a lge amt of bright red blood and darker blood clots in water of commode. Pt said for 10 yrs has alternated between constipated stools and watery diarrhea but has never had blood before; pt does not take stool softeners. Pt said not sure when had last colonoscopy but think polyps were found. No abd pain or fever. Pt does have low back pain at tailbone. Pt also thought this afternoon needed to have BM but did not have BM only large amt of blood in commode and on tissue when wiped. No way to ck BP. Pt agrees to go to ED now and pts granddaughter will take pt to [facility] ED now. FYI to Dr Damita Dunnings.

## 2020-02-27 NOTE — Discharge Instructions (Signed)
I have given you a referral to [facility] gastroenterology.  Please give them a call tomorrow to schedule a follow-up appointment.  If you develop any new or worsening symptoms I need you to return to the emergency department immediately for reevaluation.  It was a pleasure to meet you.

## 2020-02-27 NOTE — Telephone Encounter (Signed)
Patient called in stating she spoke with access nurse in regards to rectal bleeding. Patient would like to see PCP and not go to ER, as she does not want to go if it could just be hemmorrhoids. Please advise.

## 2020-02-27 NOTE — ED Provider Notes (Signed)
[facility] DEPT Provider Note   CSN: [id number] Arrival date & time: 02/27/20  1735     History Chief Complaint  Patient presents with   Rectal Bleeding    Abigail Marks is a 64 y.o. female.  HPI Patient is a 64 year old female with medical history as noted below.  She states that yesterday she noticed bright red blood as well as multiple blood clots when having a bowel movement.  This morning she had a similar episode.  She called her PCP who recommended that she come to the emergency department for further evaluation.  She also notes 2 days of diffuse mild low back pain.  She has taken Tylenol with short-term relief.  She denies a history of similar symptoms.  She is not anticoagulated.  No chronic alcohol use.  No chronic NSAID use. She denies fevers, chills, chest pain, shortness of breath, leg swelling, dysuria, hematuria, syncope, dizziness, lightheadedness.    Past Medical History:  Diagnosis Date   Anemia 03/2010   Arthritis    Complication of anesthesia    Depression    GERD (gastroesophageal reflux disease)    Herniated lumbar intervertebral disc 10/2000   Injections   History of hiatal hernia    Hyperlipidemia    Hypothyroidism    PONV (postoperative nausea and vomiting)    Thyroid disease    Hypothyroidism s/p radioactive ablation   Tremor     Patient Active Problem List   Diagnosis Date Noted   Dizziness 01/27/2017   Hyperglycemia 10/19/2016   GERD (gastroesophageal reflux disease) 07/06/2014   Advance care planning 04/05/2014   Routine general medical examination at a health care facility 12/25/2012   Elevated blood pressure reading without diagnosis of hypertension 01/01/2012   Tremor 03/25/2011   UNSPECIFIED PTOSIS OF EYELID 09/28/2007   Hypothyroidism 02/03/2007   HLD (hyperlipidemia) 02/03/2007   Depression 02/03/2007   IBS 02/03/2007   INSOMNIA 02/03/2007    Past Surgical History:    Procedure Laterality Date   ABDOMINAL HYSTERECTOMY     Partial,prolapse   BLADDER SUSPENSION  03/2005   CATARACT EXTRACTION W/PHACO Right 11/11/2015   Procedure: CATARACT EXTRACTION PHACO AND INTRAOCULAR LENS PLACEMENT ([place]);  Surgeon: Estill Cotta, MD;  Location: [facility] ORS;  Service: Ophthalmology;  Laterality: Right;  [place] 00:55 AP% 23.0 CDE 22.84 fluid pack lot # [id number]   CATARACT EXTRACTION W/PHACO Left 11/01/2015   Procedure: CATARACT EXTRACTION PHACO AND INTRAOCULAR LENS PLACEMENT (I[facility]);  Surgeon: Estill Cotta, MD;  Location: [facility] ORS;  Service: Ophthalmology;  Laterality: Left;  [place]            1:05 AP%        23 CDE       28.79       fluid casette lot # [id number]  exp5/31/2018   CHOLECYSTECTOMY     OOPHORECTOMY     bilateral cysts     OB History   No obstetric history on file.     Family History  Problem Relation Age of Onset   Diabetes Mother    Hypertension Mother    Cancer Mother        breast   Heart disease Mother        MI   Breast cancer Mother    Cancer Sister        Breast   Breast cancer Sister    Heart disease Sister    Hypertension Son    Kidney disease Son    Healthy Daughter  Colon cancer Neg Hx     Social History   Tobacco Use   Smoking status: Former Smoker    Quit date: 08/17/2004    Years since quitting: 15.5   Smokeless tobacco: Never Used  Vaping Use   Vaping Use: Never used  Substance Use Topics   Alcohol use: Yes    Alcohol/week: 5.0 standard drinks    Types: 5 Cans of beer per week    Comment: occ   Drug use: No    Home Medications Prior to Admission medications   Medication Sig Start Date End Date Taking? Authorizing Provider  Calcium Carbonate-Vitamin D (CALCIUM 600+D HIGH POTENCY) 600-400 MG-UNIT per tablet Take 1 or 2 capsules by mouth daily.     [provider]  conjugated estrogens (PREMARIN) vaginal cream Place 1 Applicatorful vaginally 3 (three) times a week. Patient taking  differently: Place 1 Applicatorful vaginally once a week.  03/15/19   Joylene John D, NP  denosumab (PROLIA) 60 MG/ML SOSY injection Every 6 months    [provider]  FLUoxetine (PROZAC) 10 MG capsule Take 10 mg by mouth daily.    [provider]  FLUoxetine (PROZAC) 20 MG capsule Take 2 capsules (40 mg total) by mouth daily. 08/30/19   Tonia Ghent, MD  levothyroxine (SYNTHROID) 150 MCG tablet TAKE 1 TABLET BY MOUTH ONCE DAILY BEFORE BREAKFAST EXCEPT 1/2 TABLET ON SUNDAYS AND WEDNESDAYS. 6 TABLETS PER WEEK 06/01/19   Tonia Ghent, MD  Multiple Vitamin (MULTIVITAMIN) tablet Take 1 tablet by mouth daily.      [provider]  primidone (MYSOLINE) 50 MG tablet Take 1 tablet (50 mg total) by mouth at bedtime. 11/15/19   Tat, Eustace Quail, DO  simvastatin (ZOCOR) 20 MG tablet TAKE 1 TABLET(20 MG) BY MOUTH DAILY 01/13/20   Tonia Ghent, MD  Fluoxetine HCl, PMDD, 10 MG TABS Take 1 tablet (10 mg total) by mouth daily. Please ask patient to make an appointment for physical exam prior to further refills. 03/14/19 11/15/19  Tonia Ghent, MD    Allergies    Atorvastatin, Bupropion hcl, Ezetimibe-simvastatin, Lunesta [eszopiclone], Mirtazapine, Nsaids, Omeprazole-sodium bicarbonate, Paroxetine, Raloxifene, Rosuvastatin, Temazepam, Trazodone and nefazodone, and Zolpidem tartrate  Review of Systems   Review of Systems  All other systems reviewed and are negative. Ten systems reviewed and are negative for acute change, except as noted in the HPI.    Physical Exam Updated Vital Signs BP (!) 158/63 (BP Location: Left Arm)    Pulse 67    Temp 98.5 F (36.9 C) (Oral)    Resp 18    Ht 5\' 3"  (1.6 m)    Wt 59.9 kg    SpO2 100%    BMI 23.38 kg/m   Physical Exam Vitals and nursing note reviewed.  Constitutional:      General: She is not in acute distress.    Appearance: Normal appearance. She is not ill-appearing, toxic-appearing or diaphoretic.  HENT:     Head:  Normocephalic and atraumatic.     Right Ear: External ear normal.     Left Ear: External ear normal.     Nose: Nose normal.     Mouth/Throat:     Mouth: Mucous membranes are moist.     Pharynx: Oropharynx is clear. No oropharyngeal exudate or posterior oropharyngeal erythema.  Eyes:     Extraocular Movements: Extraocular movements intact.  Cardiovascular:     Rate and Rhythm: Normal rate and regular rhythm.  Pulses: Normal pulses.     Heart sounds: Normal heart sounds. No murmur heard.  No friction rub. No gallop.   Pulmonary:     Effort: Pulmonary effort is normal. No respiratory distress.     Breath sounds: Normal breath sounds. No stridor. No wheezing, rhonchi or rales.  Abdominal:     General: Abdomen is flat.     Palpations: Abdomen is soft.     Tenderness: There is no abdominal tenderness. There is no guarding or rebound.     Comments: Abdomen is soft and nontender in all 4 quadrants.  No rebound.  No guarding.  Genitourinary:    Rectum: Guaiac result positive.     Comments: Normal-appearing rectum with 1 nonthrombosed external hemorrhoid appreciated.  No tenderness appreciated throughout the exam.  No palpable internal hemorrhoids noted.  Small amount of brown stool mixed with red blood appreciated in the rectal vault. Musculoskeletal:        General: Tenderness present. Normal range of motion.     Cervical back: Normal range of motion and neck supple. No tenderness.     Comments: No midline C, T, L-spine tenderness.  No palpable pain appreciated in the lumbar region.  Mild TTP noted overlying the sacrum.  No overlying erythema or edema.  No ecchymosis.  Skin:    General: Skin is warm and dry.  Neurological:     General: No focal deficit present.     Mental Status: She is alert and oriented to person, place, and time.  Psychiatric:        Mood and Affect: Mood normal.        Behavior: Behavior normal.    ED Results / Procedures / Treatments   Labs (all labs ordered  are listed, but only abnormal results are displayed) Labs Reviewed  COMPREHENSIVE METABOLIC PANEL - Abnormal; Notable for the following components:      Result Value   Calcium 8.8 (*)    All other components within normal limits  CBC - Abnormal; Notable for the following components:   RBC 3.77 (*)    All other components within normal limits  POC OCCULT BLOOD, ED - Abnormal; Notable for the following components:   Fecal Occult Bld POSITIVE (*)    All other components within normal limits  TYPE AND SCREEN   EKG EKG Interpretation  Date/Time:  Tuesday February 27 2020 19:04:48 EDT Ventricular Rate:  66 PR Interval:    QRS Duration: 90 QT Interval:  420 QTC Calculation: 440 R Axis:   65 Text Interpretation: Sinus rhythm Nonspecific T wave abnormality Confirmed by Lajean Saver [phone]) on 02/27/2020 7:18:15 PM   Radiology CT ABDOMEN PELVIS W CONTRAST  Result Date: 02/27/2020 CLINICAL DATA:  Gastrointestinal bleeding, low back pain EXAM: CT ABDOMEN AND PELVIS WITH CONTRAST TECHNIQUE: Multidetector CT imaging of the abdomen and pelvis was performed using the standard protocol following bolus administration of intravenous contrast. CONTRAST:  18mL OMNIPAQUE IOHEXOL 300 MG/ML  SOLN COMPARISON:  11/22/2014 FINDINGS: Lower chest: No acute pleural or parenchymal lung disease. Hepatobiliary: No focal liver abnormality is seen. Status post cholecystectomy. No biliary dilatation. Pancreas: Unremarkable. No pancreatic ductal dilatation or surrounding inflammatory changes. Spleen: Normal in size without focal abnormality. Adrenals/Urinary Tract: Adrenal glands are unremarkable. Kidneys are normal, without renal calculi, focal lesion, or hydronephrosis. Bladder is unremarkable. Stomach/Bowel: No bowel obstruction or ileus. Normal appendix right lower quadrant. No bowel wall thickening or inflammatory change. Vascular/Lymphatic: Aortic atherosclerosis. No enlarged abdominal or pelvic lymph nodes.  Reproductive: Status post hysterectomy. No adnexal masses. Other: No abdominal wall hernia or abnormality. No abdominopelvic ascites. Musculoskeletal: No acute or destructive bony lesions. Reconstructed images demonstrate no additional findings. IMPRESSION: No acute intra-abdominal or intrapelvic process. Electronically Signed   By: Randa Ngo M.D.   On: 02/27/2020 20:19   Procedures Procedures (including critical care time)  Medications Ordered in ED Medications - No data to display  ED Course  I have reviewed the triage vital signs and the nursing notes.  Pertinent labs & imaging results that were available during my care of the patient were reviewed by me and considered in my medical decision making (see chart for details).  Clinical Course as of Feb 26 2206  Tue Feb 27, 2020  1917 Within normal limits  Hemoglobin: 12.2 [LJ]  2024 No acute intra-abdominal or intrapelvic abnormalities appreciated  CT ABDOMEN PELVIS W CONTRAST [LJ]    Clinical Course User Index [LJ] Rayna Sexton, PA-C   MDM Rules/Calculators/A&P                          Pt is a 64 y.o. female that present with a history, physical exam, ED Clinical Course as noted above.   Patient presents today with 2 episodes of rectal bleeding that occurred in the past 24 hours.  No history of similar symptoms.  Patient noted to have small amount of red blood in her rectal vault mixed with brown stool.  Otherwise, physical exam is generally reassuring.  CT scan was obtained of the abdomen and pelvis with IV contrast.  This was negative for acute abnormalities.  Her hemoglobin is stable today at 12.2.  Patient is otherwise asymptomatic and labs are otherwise reassuring.  Will discharge patient with strict follow-up with gastroenterology.  Patient was given strict return precautions.  She understands she is to return to the emergency department any new or worsening symptoms.  Her questions were answered and she was amicable  at the time of discharge.  Her vital signs are stable.  She is oxygenating well.  She is afebrile.  Patient discharged to home/self care.  Condition at discharge: Stable  Note: Portions of this report may have been transcribed using voice recognition software. Every effort was made to ensure accuracy; however, inadvertent computerized transcription errors may be present.   Final Clinical Impression(s) / ED Diagnoses Final diagnoses:  Rectal bleeding   Rx / DC Orders ED Discharge Orders    None       Rayna Sexton, PA-C 02/27/20 2208    Lajean Saver, MD 02/29/20 1729

## 2020-02-28 NOTE — Telephone Encounter (Signed)
Per chart review tab pt seen at [facility] ED on 02/27/20 and pt is to FU with GI.

## 2020-02-28 NOTE — Telephone Encounter (Signed)
Noted, see ER note.  Thanks.

## 2020-02-28 NOTE — Telephone Encounter (Signed)
[facility] - Client TELEPHONE ADVICE RECORD [facility] Patient Name: Abigail Marks Gender: Female DOB: [birth year] Age: 64 Y 37 M 14 D Return Phone Number: [phone] (Primary) Address: City/State/Zip: [place] [address] Client [facility] - Client Client Site [place] Physician Renford Dills - MD Contact Type Call Who Is Calling Patient / Member / Family / Caregiver Call Type Triage / Clinical Relationship To Patient Self Return Phone Number [phone] (Primary) Chief Complaint Rectal Bleeding Reason for Call Symptomatic / Request for [place] is having rectal bleeding with lower abdominal and back pain. Translation No Nurse Assessment Nurse: Thad Ranger, RN, Denise Date/Time (Eastern Time): 02/27/2020 3:10:53 PM Confirm and document reason for call. If symptomatic, describe symptoms. ---Caller is having rectal bleeding with lower abdominal and back pain. Has the patient had close contact with a person known or suspected to have the novel coronavirus illness OR traveled / lives in area with major community spread (including international travel) in the last 14 days from the onset of symptoms? * If Asymptomatic, screen for exposure and travel within the last 14 days. ---No Does the patient have any new or worsening symptoms? ---Yes Will a triage be completed? ---Yes Related visit to physician within the last 2 weeks? ---No Does the PT have any chronic conditions? (i.e. diabetes, asthma, this includes High risk factors for pregnancy, etc.) ---No Is this a behavioral health or substance abuse call? ---No Guidelines Guideline Title Affirmed Question Affirmed Notes Nurse Date/Time Eilene Ghazi Time) Rectal Bleeding [1] MODERATE rectal bleeding (small blood clots, passing blood without stool, or toilet water turns red) AND [2] more than once a day Carmon,  RN, Langley Gauss 02/27/2020 3:11:46 PM Disp. Time Eilene Ghazi Time) Disposition Final User 02/27/2020 3:20:58 PM Attempt made - no message left Carmon, RN, Langley Gauss PLEASE NOTE: All timestamps contained within this report are represented as [place] Standard Time. CONFIDENTIALTY NOTICE: This fax transmission is intended only for the addressee. It contains information that is legally privileged, confidential or otherwise protected from use or disclosure. If you are not the intended recipient, you are strictly prohibited from reviewing, disclosing, copying using or disseminating any of this information or taking any action in reliance on or regarding this information. If you have received this fax in error, please notify us immediately by telephone so that we can arrange for its return to [place]. Phone: [phone], Toll-Free: [phone], Fax: [phone] Page: 2 of 2 Call Id: [id number] [place]. Time Eilene Ghazi Time) Disposition Final User 02/27/2020 3:22:07 PM Attempt made - no message left Carmon, RN, Langley Gauss 02/27/2020 3:22:16 PM Call Completed Thad Ranger, RN, Langley Gauss 02/27/2020 3:14:49 PM Go to ED Now Yes Thad Ranger, RN, Yevette Edwards Disagree/Comply Disagree Caller Understands Yes PreDisposition Call Doctor Care Advice Given Per Guideline GO TO ED NOW: DRIVING: * Another adult should drive. BRING MEDICINES: * Please bring a list of your current medicines when you go to the Emergency Department (ER). CARE ADVICE given per Rectal Bleeding (Adult) guideline. Comments User: Romeo Apple, RN Date/Time Eilene Ghazi Time): 02/27/2020 3:14:48 PM Pt wants to be seen by PCP only. User: Romeo Apple, RN Date/Time Eilene Ghazi Time): 02/27/2020 3:15:08 PM Advised will get msg to the MDO and she should get a cb shortly. User: Romeo Apple, RN Date/Time Eilene Ghazi Time): 02/27/2020 3:21:45 PM Called MDO and phone rang several times then got fast busy signal x 2 attempts. Referrals GO TO FACILITY REFUSED

## 2020-03-04 NOTE — Telephone Encounter (Signed)
Pt called wanting to get a referral to [facility] clinic GI.  She stated she is still bleeding when going to bathroom  Best number [phone]

## 2020-03-05 NOTE — Telephone Encounter (Signed)
Appt scheduled with Dr Alice Reichert at [facility] for this Thursday 03/07/20 , patient has been notified.

## 2020-03-05 NOTE — Telephone Encounter (Signed)
I put in the referral.  Thanks.  

## 2020-03-05 NOTE — Addendum Note (Signed)
Addended by: Tonia Ghent on: 03/05/2020 07:01 AM   Modules accepted: Orders

## 2020-03-07 DIAGNOSIS — R194 Change in bowel habit: Secondary | ICD-10-CM | POA: Insufficient documentation

## 2020-03-15 ENCOUNTER — Other Ambulatory Visit: Payer: Self-pay | Admitting: Gynecologic Oncology

## 2020-03-15 DIAGNOSIS — D072 Carcinoma in situ of vagina: Secondary | ICD-10-CM

## 2020-04-12 ENCOUNTER — Other Ambulatory Visit: Payer: Self-pay | Admitting: Family Medicine

## 2020-06-11 ENCOUNTER — Other Ambulatory Visit: Payer: Self-pay | Admitting: Family Medicine

## 2020-06-11 DIAGNOSIS — E039 Hypothyroidism, unspecified: Secondary | ICD-10-CM

## 2020-08-10 ENCOUNTER — Other Ambulatory Visit: Payer: Self-pay | Admitting: Family Medicine

## 2020-08-10 DIAGNOSIS — E039 Hypothyroidism, unspecified: Secondary | ICD-10-CM

## 2020-08-10 DIAGNOSIS — E785 Hyperlipidemia, unspecified: Secondary | ICD-10-CM

## 2020-08-12 NOTE — Telephone Encounter (Signed)
Please Advise

## 2020-08-12 NOTE — Telephone Encounter (Signed)
Sent. Thanks.  Please schedule yearly visit with labs ahead of time.

## 2020-08-29 DIAGNOSIS — Z90710 Acquired absence of both cervix and uterus: Secondary | ICD-10-CM | POA: Insufficient documentation

## 2020-08-29 DIAGNOSIS — Z9189 Other specified personal risk factors, not elsewhere classified: Secondary | ICD-10-CM | POA: Insufficient documentation

## 2020-08-29 DIAGNOSIS — M810 Age-related osteoporosis without current pathological fracture: Secondary | ICD-10-CM | POA: Insufficient documentation

## 2020-09-09 ENCOUNTER — Telehealth: Payer: Self-pay | Admitting: Neurology

## 2020-09-10 NOTE — Telephone Encounter (Signed)
Medication refill denied and note sent to pharmacy to send request to pcp since patient has not been seen recently.

## 2020-09-12 ENCOUNTER — Other Ambulatory Visit: Payer: Self-pay | Admitting: Family Medicine

## 2020-09-12 NOTE — Telephone Encounter (Signed)
Pt left v/m that Dr Tat was going to let Dr Damita Dunnings do refills for primidone 50 mg. Pt is out of primidone for 3 days and pt request refill for prim[facility] to [facility] [address]. Pt request cb when done.

## 2020-09-13 NOTE — Telephone Encounter (Signed)
Called patient to schedule CPE and lab

## 2020-09-13 NOTE — Telephone Encounter (Signed)
Sent. Thanks.  Needs cpe when possible.

## 2020-10-04 ENCOUNTER — Other Ambulatory Visit: Payer: Self-pay | Admitting: Family Medicine

## 2020-10-04 DIAGNOSIS — E039 Hypothyroidism, unspecified: Secondary | ICD-10-CM

## 2020-10-07 NOTE — Telephone Encounter (Signed)
Patient is requesting refill on levothyroxine 150 mcg tablets. Patient was last seen 06/01/2019. She did schedule labs for 10/24/20 and OV for 10/31/2020. Okay to refill?

## 2020-10-08 ENCOUNTER — Other Ambulatory Visit: Payer: Self-pay | Admitting: Family Medicine

## 2020-10-08 DIAGNOSIS — E039 Hypothyroidism, unspecified: Secondary | ICD-10-CM

## 2020-10-08 NOTE — Telephone Encounter (Signed)
Pharmacy requests refill on: Levothyroxine 150 mcg   LAST REFILL: 06/11/2020 (Q-90, R-0) LAST OV: 06/01/2019 NEXT OV: 10/31/2020 PHARMACY: [id number] [place], [place]  TSH (05/29/2019): 0.44

## 2020-10-08 NOTE — Telephone Encounter (Signed)
Patient left a voicemail stating that her pharmacy has been trying to get a refill on her thyroid medication since 10/04/20. Patient stated that she has been out of her thyroid medication for 3 days now. Patient is requesting that this refill be done today.

## 2020-10-09 NOTE — Telephone Encounter (Signed)
Yes.  Sent. Thanks.

## 2020-10-24 ENCOUNTER — Other Ambulatory Visit: Payer: Self-pay

## 2020-10-24 ENCOUNTER — Other Ambulatory Visit (INDEPENDENT_AMBULATORY_CARE_PROVIDER_SITE_OTHER): Payer: PPO

## 2020-10-24 DIAGNOSIS — E039 Hypothyroidism, unspecified: Secondary | ICD-10-CM | POA: Diagnosis not present

## 2020-10-24 DIAGNOSIS — E785 Hyperlipidemia, unspecified: Secondary | ICD-10-CM | POA: Diagnosis not present

## 2020-10-24 LAB — COMPREHENSIVE METABOLIC PANEL
ALT: 24 U/L (ref 0–35)
AST: 19 U/L (ref 0–37)
Albumin: 4.3 g/dL (ref 3.5–5.2)
Alkaline Phosphatase: 56 U/L (ref 39–117)
BUN: 16 mg/dL (ref 6–23)
CO2: 32 mEq/L (ref 19–32)
Calcium: 9.3 mg/dL (ref 8.4–10.5)
Chloride: 104 mEq/L (ref 96–112)
Creatinine, Ser: 0.7 mg/dL (ref 0.40–1.20)
GFR: 90.27 mL/min (ref >60.00)
Glucose, Bld: 95 mg/dL (ref 70–99)
Potassium: 4.4 mEq/L (ref 3.5–5.1)
Sodium: 142 mEq/L (ref 135–145)
Total Bilirubin: 0.4 mg/dL (ref 0.2–1.2)
Total Protein: 7 g/dL (ref 6.0–8.3)

## 2020-10-24 LAB — LIPID PANEL
Cholesterol: 180 mg/dL (ref 0–200)
HDL: 61.6 mg/dL (ref >39.00)
LDL Cholesterol: 96 mg/dL (ref 0–99)
NonHDL: 118.11
Total CHOL/HDL Ratio: 3
Triglycerides: 109 mg/dL (ref 0.0–149.0)
VLDL: 21.8 mg/dL (ref 0.0–40.0)

## 2020-10-24 LAB — TSH: TSH: 0.01 u[IU]/mL — ABNORMAL LOW (ref 0.35–4.50)

## 2020-10-31 ENCOUNTER — Ambulatory Visit (INDEPENDENT_AMBULATORY_CARE_PROVIDER_SITE_OTHER): Payer: PPO | Admitting: Family Medicine

## 2020-10-31 ENCOUNTER — Other Ambulatory Visit: Payer: Self-pay

## 2020-10-31 ENCOUNTER — Encounter: Payer: Self-pay | Admitting: Family Medicine

## 2020-10-31 VITALS — BP 122/80 | HR 68 | Temp 98.0°F | Ht 63.0 in | Wt 134.0 lb

## 2020-10-31 DIAGNOSIS — Z23 Encounter for immunization: Secondary | ICD-10-CM | POA: Diagnosis not present

## 2020-10-31 DIAGNOSIS — Z0000 Encounter for general adult medical examination without abnormal findings: Secondary | ICD-10-CM | POA: Diagnosis not present

## 2020-10-31 DIAGNOSIS — R251 Tremor, unspecified: Secondary | ICD-10-CM

## 2020-10-31 DIAGNOSIS — F32A Depression, unspecified: Secondary | ICD-10-CM

## 2020-10-31 DIAGNOSIS — E785 Hyperlipidemia, unspecified: Secondary | ICD-10-CM | POA: Diagnosis not present

## 2020-10-31 DIAGNOSIS — E039 Hypothyroidism, unspecified: Secondary | ICD-10-CM

## 2020-10-31 DIAGNOSIS — Z1211 Encounter for screening for malignant neoplasm of colon: Secondary | ICD-10-CM

## 2020-10-31 DIAGNOSIS — Z7189 Other specified counseling: Secondary | ICD-10-CM

## 2020-10-31 MED ORDER — LEVOTHYROXINE SODIUM 150 MCG PO TABS: ORAL_TABLET | ORAL | 3 refills | Status: DC

## 2020-10-31 MED ORDER — FLUOXETINE HCL 20 MG PO CAPS: ORAL_CAPSULE | ORAL | 3 refills | Status: DC

## 2020-10-31 MED ORDER — PRIMIDONE 50 MG PO TABS: ORAL_TABLET | ORAL | 3 refills | Status: DC

## 2020-10-31 MED ORDER — SIMVASTATIN 20 MG PO TABS: 20.0000 mg | ORAL_TABLET | Freq: Every day | ORAL | 3 refills | Status: DC

## 2020-10-31 NOTE — Patient Instructions (Addendum)
Recheck TSH in about 2 months at a nonfasting lab visit.  Take care.  Glad to see you. Thanks for your effort.  Update me as needed.

## 2020-10-31 NOTE — Progress Notes (Signed)
This visit occurred during the SARS-CoV-2 public health emergency.  Safety protocols were in place, including screening questions prior to the visit, additional usage of staff PPE, and extensive cleaning of exam room while observing appropriate contact time as indicated for disinfecting solutions.  I have personally reviewed the Medicare Annual Wellness questionnaire and have noted 1. The patient's medical and social history 2. Their use of alcohol, tobacco or illicit drugs 3. Their current medications and supplements 4. The patient's functional ability including ADL's, fall risks, home safety risks and hearing or visual             impairment. 5. Diet and physical activities 6. Evidence for depression or mood disorders  The patients weight, height, BMI have been recorded in the chart and visual acuity is per eye clinic.  I have made referrals, counseling and provided education to the patient based review of the above and I have provided the pt with a written personalized care plan for preventive services.  Provider list updated- see scanned forms.  Routine anticipatory guidance given to patient.  See health maintenance. The possibility exists that previously documented standard health maintenance information may have been brought forward from a previous encounter into this note.  If needed, that same information has been updated to reflect the current situation based on today's encounter.    Flu up-to-date Shingles discussed with patient PNA-23 2022 Tetanus 2014 Covid vaccine up-to-date Colonoscopy referral placed in pending Breast cancer screening per gynecology Bone density test per gynecology Advance directive- Lorriane Shire Sweger would be designated if patient were incapacitated.  Cognitive function addressed- see scanned forms- and if abnormal then additional documentation follows.   EKG in chart.   Vision and hearing screen done. She'll observe for memory changes given normal testing  today.    Hypothyroidism.  Taking primidone for tremor.  It helped prev but TSH low and tremor is worse in the meantime, noted in the last month.  More insomnia noted, inc appetite.    Elevated Cholesterol: Using medications without problems: yes Muscle aches: no  Diet compliance: yes Exercise: yes  Mood d/w pt.  Mood is still good.  Retired, daughter moved in with patient.    B ear ringing.  Long standing, about 1 year.  No new meds.  We talked about masking at night, that helps.  She will update me as needed.  PMH and SH reviewed  Meds, vitals, and allergies reviewed.   ROS: Per HPI.  Unless specifically indicated otherwise in HPI, the patient denies:  General: fever. Eyes: acute vision changes ENT: sore throat Cardiovascular: chest pain Respiratory: SOB GI: vomiting GU: dysuria Musculoskeletal: acute back pain Derm: acute rash Neuro: acute motor dysfunction Psych: worsening mood Endocrine: polydipsia Heme: bleeding Allergy: hayfever  GEN: nad, alert and oriented HEENT: NCAT NECK: supple w/o LA CV: rrr. PULM: ctab, no inc wob ABD: soft, +bs EXT: no edema SKIN: Well-perfused.

## 2020-11-03 NOTE — Assessment & Plan Note (Addendum)
Would continue primidone.  We will address her depressed  TSH and that should help.

## 2020-11-03 NOTE — Assessment & Plan Note (Signed)
Decrease levothyroxine to 1 tablet a day except for half tablet on Sunday.  She will skip a dose on Wednesday.  Total of 5.5 tablets/day.  Recheck TSH in about 2 months.

## 2020-11-03 NOTE — Assessment & Plan Note (Signed)
Continue simvastatin.  Continue work on diet and exercise.  She agrees.  Labs discussed with patient.

## 2020-11-03 NOTE — Assessment & Plan Note (Signed)
Advance directive- Vanessa Sweger would be designated if patient were incapacitated.   

## 2020-11-03 NOTE — Assessment & Plan Note (Signed)
Mood is good.  Compliant with SSRI.  Would continue fluoxetine.  She will update me as needed.

## 2020-11-03 NOTE — Assessment & Plan Note (Signed)
Flu up-to-date Shingles discussed with patient PNA-23 2022 Tetanus 2014 Covid vaccine up-to-date Colonoscopy referral placed in pending Breast cancer screening per gynecology Bone density test per gynecology Advance directive- Lorriane Shire Sweger would be designated if patient were incapacitated.  Cognitive function addressed- see scanned forms- and if abnormal then additional documentation follows.

## 2020-12-31 ENCOUNTER — Other Ambulatory Visit (INDEPENDENT_AMBULATORY_CARE_PROVIDER_SITE_OTHER): Payer: PPO

## 2020-12-31 ENCOUNTER — Other Ambulatory Visit: Payer: Self-pay

## 2020-12-31 DIAGNOSIS — E039 Hypothyroidism, unspecified: Secondary | ICD-10-CM

## 2020-12-31 LAB — TSH: TSH: 0.03 u[IU]/mL — ABNORMAL LOW (ref 0.35–4.50)

## 2021-01-01 ENCOUNTER — Other Ambulatory Visit: Payer: Self-pay | Admitting: Family Medicine

## 2021-01-01 DIAGNOSIS — E039 Hypothyroidism, unspecified: Secondary | ICD-10-CM

## 2021-01-01 MED ORDER — LEVOTHYROXINE SODIUM 150 MCG PO TABS: ORAL_TABLET | ORAL | 3 refills | Status: DC

## 2021-01-07 ENCOUNTER — Encounter: Payer: Self-pay | Admitting: Family Medicine

## 2021-01-07 DIAGNOSIS — E039 Hypothyroidism, unspecified: Secondary | ICD-10-CM

## 2021-01-07 MED ORDER — LEVOTHYROXINE SODIUM 150 MCG PO TABS: ORAL_TABLET | ORAL | 3 refills | Status: DC

## 2021-01-07 NOTE — Telephone Encounter (Signed)
Pt called in to triage concern because she thinks PCP has her synthroid dose wrong. Pt said before PCP wanted to change thyroid med (due to recent labs) she was taking 6 pills a week not 5.5 as listed in [employer]. Pt said she was taking one pill every day except 1/2 pill on Mondays and Wednesdays, that would equil 6 pills a week not 5.5, pt said since PCP had wrong amount of pills to start with she thinks cutting 2 days out would be "to much" pt said she is already super fatigued and is concerned about stopping meds 2 days out the week. Pt wanted to review this dosing with PCP before anything is changed and it's "wrong"   CB# [phone]

## 2021-01-07 NOTE — Telephone Encounter (Signed)
Spoke with patient and she is taking 6 tablets a week. I advised to cut back to 5.5 tablets a week and patient agrees to do so. Patient made lab appt for 03/07/21 at 8:30 am.

## 2021-01-07 NOTE — Telephone Encounter (Signed)
please check with patient.  I apologize.  I appreciate her call back.   I thought she was already on 5.5 tabs a week.   If currently taking 6 tabs a week, then I would cut back to 5.5 tabs per week and recheck TSH in about 2 months. She could skip Sunday, take 1/2 tab on Wednesday and 1 tab on the other days of the week.  Thanks.

## 2021-02-25 ENCOUNTER — Other Ambulatory Visit: Payer: Self-pay

## 2021-02-25 ENCOUNTER — Encounter: Payer: Self-pay | Admitting: Family Medicine

## 2021-02-25 ENCOUNTER — Ambulatory Visit (INDEPENDENT_AMBULATORY_CARE_PROVIDER_SITE_OTHER): Payer: PPO | Admitting: Family Medicine

## 2021-02-25 VITALS — BP 118/66 | HR 75 | Temp 96.9°F | Wt 132.5 lb

## 2021-02-25 DIAGNOSIS — L989 Disorder of the skin and subcutaneous tissue, unspecified: Secondary | ICD-10-CM

## 2021-02-25 DIAGNOSIS — D171 Benign lipomatous neoplasm of skin and subcutaneous tissue of trunk: Secondary | ICD-10-CM

## 2021-02-25 NOTE — Patient Instructions (Signed)
No charge for visit.  Take care.  Glad to see you. We'll call about seeing general surgery.  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

## 2021-02-25 NOTE — Progress Notes (Signed)
This visit occurred during the SARS-CoV-2 public health emergency.  Safety protocols were in place, including screening questions prior to the visit, additional usage of staff PPE, and extensive cleaning of exam room while observing appropriate contact time as indicated for disinfecting solutions.  Chest wall mass noted about 1 week ago.  Not ttp "unless I mess with it a lot."  Not red.  No trauma.  No draining.  No FCNAVD.  No other similar lesions.    Mammogram done last year with gyn clinic, normal per patient report.  No breast mass.    Meds, vitals, and allergies reviewed.   ROS: Per HPI unless specifically indicated in ROS section   Nad ncat 2.5x1.5cm in the skin on the upper midline chest wall.  No spreading erythema.  No discharge.

## 2021-02-26 DIAGNOSIS — L989 Disorder of the skin and subcutaneous tissue, unspecified: Secondary | ICD-10-CM | POA: Insufficient documentation

## 2021-02-26 NOTE — Assessment & Plan Note (Signed)
Likely lipoma.  Discussed with patient.  No reason to suspect any ominous or cancerous process but it would be reasonable to see general surgery.  Referral placed.  No charge for visit.  She agrees with plan.

## 2021-03-05 ENCOUNTER — Other Ambulatory Visit: Payer: Self-pay

## 2021-03-05 ENCOUNTER — Encounter: Payer: Self-pay | Admitting: Surgery

## 2021-03-05 ENCOUNTER — Ambulatory Visit: Payer: PPO | Admitting: Surgery

## 2021-03-05 VITALS — BP 118/72 | HR 74 | Temp 98.4°F | Ht 63.0 in | Wt 134.2 lb

## 2021-03-05 DIAGNOSIS — D171 Benign lipomatous neoplasm of skin and subcutaneous tissue of trunk: Secondary | ICD-10-CM

## 2021-03-05 DIAGNOSIS — R222 Localized swelling, mass and lump, trunk: Secondary | ICD-10-CM

## 2021-03-05 NOTE — Patient Instructions (Addendum)
Ultrasound scheduled 03/12/2021 @10 :00 am . [facility] [address].  [place] [id number] Dr.Piscoya will call you with the results.   Lipoma Removal  Lipoma removal is a surgical procedure to remove a lipoma, which is a noncancerous (benign) tumor that is made up of fat cells. Most lipomas are small and painless and do not require treatment. They can form in many areas of the body but are mostcommon under the skin of the back, arms, shoulders, buttocks, and thighs. You may need lipoma removal if you have a lipoma that is large, growing, orcausing discomfort. Lipoma removal may also be done for cosmetic reasons. Tell a health care provider about: Any allergies you have. All medicines you are taking, including vitamins, herbs, eye drops, creams, and over-the-counter medicines. Any problems you or family members have had with anesthetic medicines. Any blood disorders you have. Any surgeries you have had. Any medical conditions you have. Whether you are pregnant or may be pregnant. What are the risks? Generally, this is a safe procedure. However, problems may occur, including: Infection. Bleeding. Scarring. Allergic reactions to medicines. Damage to nearby structures or organs, such as damage to nerves or blood vessels near the lipoma. What happens before the procedure? Staying hydrated Follow instructions from your health care provider about hydration, which may include: Up to 2 hours before the procedure - you may continue to drink clear liquids, such as water, clear fruit juice, black coffee, and plain tea. Eating and drinking restrictions Follow instructions from your health care provider about eating and drinking, which may include: 8 hours before the procedure - stop eating heavy meals or foods, such as meat, fried foods, or fatty foods. 6 hours before the procedure - stop eating light meals or foods, such as toast or cereal. 6 hours before the procedure - stop  drinking milk or drinks that contain milk. 2 hours before the procedure - stop drinking clear liquids. Medicines Ask your health care provider about: Changing or stopping your regular medicines. This is especially important if you are taking diabetes medicines or blood thinners. Taking medicines such as aspirin and ibuprofen. These medicines can thin your blood. Do not take these medicines unless your health care provider tells you to take them. Taking over-the-counter medicines, vitamins, herbs, and supplements. General instructions You will have a physical exam. Your health care provider will check the size of the lipoma and whether it can be moved easily. You may have a biopsy and imaging tests, such as X-rays, a CT scan, and an MRI. Do not use any products that contain nicotine or tobacco for at least 4 weeks before the procedure. These products include cigarettes, e-cigarettes, and chewing tobacco. If you need help quitting, ask your health care provider. Ask your health care provider: How your surgery site will be marked. What steps will be taken to help prevent infection. These may include: Washing skin with a germ-killing soap. Taking antibiotic medicine. Plan to have someone take you home from the hospital or clinic. If you will be going home right after the procedure, plan to have someone with you for 24 hours. What happens during the procedure?  An IV will be inserted into one of your veins. You will be given one or more of the following: A medicine to help you relax (sedative). A medicine to numb the area (local anesthetic). A medicine to make you fall asleep (general anesthetic). A medicine that is injected into an area of your body to numb everything  below the injection site (regional anesthetic). An incision will be made over the lipoma or very near the lipoma. The incision may be made in a natural skin line or crease. Tissues, nerves, and blood vessels near the lipoma will  be moved out of the way. The lipoma and the capsule that surrounds it will be separated from the surrounding tissues. The lipoma will be removed. The incision may be closed with stitches (sutures). A bandage (dressing) will be placed over the incision. The procedure may vary among health care providers and hospitals. What happens after the procedure? Your blood pressure, heart rate, breathing rate, and blood oxygen level will be monitored until you leave the hospital or clinic. If you were prescribed an antibiotic medicine, use it as told by your health care provider. Do not stop using the antibiotic even if you start to feel better. If you were given a sedative during the procedure, it can affect you for several hours. Do not drive or operate machinery until your health care provider says that it is safe. Summary Before the procedure, follow instructions from your health care provider about eating and drinking, and changing or stopping your regular medicines. This is especially important if you are taking diabetes medicines or blood thinners. After the lipoma is removed, the incision may be closed with stitches (sutures) and covered with a bandage (dressing). If you were given a sedative during the procedure, it can affect you for several hours. Do not drive or operate machinery until your health care provider says that it is safe. This information is not intended to replace advice given to you by your health care provider. Make sure you discuss any questions you have with your healthcare provider. Document Revised: 03/20/2019 Document Reviewed: 03/20/2019 Elsevier Patient Education  [employer].

## 2021-03-05 NOTE — Progress Notes (Signed)
03/05/2021  Reason for Visit:  Lipoma of anterior chest wall  Referring Provider:  Elsie Stain, MD  History of Present Illness: Abigail Marks is a 65 y.o. female presenting for evaluation of a lipoma on the anterior chest wall.  The patient reports noticing this first about 3 weeks ago.  Denies any other areas of masses.  Denies any redness of the skin, induration, or drainage.  She denies any specific tenderness unless she pushes a lot on it.  She is uptodate on her mammograms and she is due again in November.  Denies any trauma to the area.  Past Medical History: Past Medical History:  Diagnosis Date   Anemia 03/2010   Arthritis    Complication of anesthesia    Depression    GERD (gastroesophageal reflux disease)    Herniated lumbar intervertebral disc 10/2000   Injections   History of hiatal hernia    Hyperlipidemia    Hypothyroidism    PONV (postoperative nausea and vomiting)    Thyroid disease    Hypothyroidism s/p radioactive ablation   Tremor      Past Surgical History: Past Surgical History:  Procedure Laterality Date   ABDOMINAL HYSTERECTOMY     Partial,prolapse   BLADDER SUSPENSION  03/2005   CATARACT EXTRACTION W/PHACO Right 11/11/2015   Procedure: CATARACT EXTRACTION PHACO AND INTRAOCULAR LENS PLACEMENT ([place]);  Surgeon: Estill Cotta, MD;  Location: [facility] ORS;  Service: Ophthalmology;  Laterality: Right;  [place] 00:55 AP% 23.0 CDE 22.84 fluid pack lot # [id number]   CATARACT EXTRACTION W/PHACO Left 11/01/2015   Procedure: CATARACT EXTRACTION PHACO AND INTRAOCULAR LENS PLACEMENT ([facility]);  Surgeon: Estill Cotta, MD;  Location: [facility] ORS;  Service: Ophthalmology;  Laterality: Left;  [place]            1:05 AP%        23 CDE       28.79       fluid casette lot # [id number]  exp5/31/2018   CHOLECYSTECTOMY     OOPHORECTOMY     bilateral cysts    Home Medications: Prior to Admission medications   Medication Sig Start Date End Date Taking? Authorizing Provider  Calcium  Carbonate-Vitamin D 600-400 MG-UNIT tablet Take 2 tablets by mouth daily.    Yes [provider]  conjugated estrogens (PREMARIN) vaginal cream Place 1 Applicatorful vaginally once a week. 03/22/20  Yes Cross, Lenna Sciara D, NP  FLUoxetine (PROZAC) 20 MG capsule TAKE 2 CAPSULES(40 MG) BY MOUTH DAILY 10/31/20  Yes Tonia Ghent, MD  levothyroxine (SYNTHROID) 150 MCG tablet Skip Sunday, take 1/2 tab on Wednesday and 1 tab on the other days of the week.  5.5 tabs per week. 01/07/21  Yes Tonia Ghent, MD  Multiple Vitamin (MULTIVITAMIN) tablet Take 1 tablet by mouth daily.   Yes [provider]  primidone (MYSOLINE) 50 MG tablet TAKE 1 TABLET(50 MG) BY MOUTH AT BEDTIME 10/31/20  Yes Tonia Ghent, MD  simvastatin (ZOCOR) 20 MG tablet Take 1 tablet (20 mg total) by mouth daily. 10/31/20  Yes Tonia Ghent, MD  Fluoxetine HCl, PMDD, 10 MG TABS Take 1 tablet (10 mg total) by mouth daily. Please ask patient to make an appointment for physical exam prior to further refills. 03/14/19 11/15/19  Tonia Ghent, MD    Allergies: Allergies  Allergen Reactions   Atorvastatin     REACTION: muscle pain   Bupropion Hcl     REACTION: decreased  BP   Ezetimibe-Simvastatin  REACTION: nausea   Lunesta [Eszopiclone] Other (See Comments)    Lack of effect   Mirtazapine     REACTION: swelling   Nsaids Other (See Comments)    gi   Omeprazole-Sodium Bicarbonate     REACTION: GI upset   Paroxetine     REACTION: felt bad   Raloxifene     REACTION: leg pain   Rosuvastatin     REACTION: increased ALT   Temazepam     nightmares   Trazodone And Nefazodone     nightmares   Zolpidem Tartrate     intolerant    Social History:  reports that she quit smoking about 16 years ago. Her smoking use included cigarettes. She has never used smokeless tobacco. She reports current alcohol use. She reports that she does not use drugs.   Family History: Family History  Problem Relation Age of  Onset   Diabetes Mother    Hypertension Mother    Cancer Mother        breast   Heart disease Mother        MI   Breast cancer Mother    Cancer Sister        Breast   Breast cancer Sister    Heart disease Sister    Healthy Daughter    Hypertension Son    Kidney disease Son    Colon cancer Neg Hx     Review of Systems: Review of Systems  Constitutional:  Negative for chills and fever.  Respiratory:  Negative for shortness of breath.   Cardiovascular:  Negative for chest pain.  Gastrointestinal:  Negative for abdominal pain, nausea and vomiting.  Skin:        Mass on anterior chest wall   Physical Exam BP 118/72   Pulse 74   Temp 98.4 F (36.9 C) (Oral)   Ht 5\' 3"  (1.6 m)   Wt 134 lb 3.2 oz (60.9 kg)   SpO2 98%   BMI 23.77 kg/m  CONSTITUTIONAL: No acute distress, well nourished. HEENT:  Normocephalic, atraumatic, extraocular motion intact. RESPIRATORY:  Normal respiratory effort without pathologic use of accessory muscles. CARDIOVASCULAR: Regular rhythm and rate. MUSCULOSKELETAL:  Normal muscle strength and tone in all four extremities.  No peripheral edema or cyanosis. SKIN:  The patient has a 2 cm mass overlying the upper third of the sternum.  It is mobile, soft, non-tender.  This would be consistent with a lipoma.  No overlying skin changes. NEUROLOGIC:  Motor and sensation is grossly normal.  Cranial nerves are grossly intact. PSYCH:  Alert and oriented to person, place and time. Affect is normal.  Laboratory Analysis: No results found for this or any previous visit (from the past 24 hour(s)).  Imaging: No results found.  Assessment and Plan: This is a 65 y.o. female with an anterior chest wall mass, likely a lipoma.  --Discussed the findings with the patient and that this is likely to be a lipoma, which is a benign mass of fatty tissue.  It is unclear how these start.  This is the only mass that she has noticed.  Discussed with her that we could resect it if  it's causing symptoms, but given the benign nature, we could also do watchful waiting.  This mass could unfortunately grow, and it tends to be slow growth.  She would like to know more clearly what this mass is and is wondering about imaging studies.  Discussed with her that we can order an ultrasound of  the anterior chest wall to evaluate the mass. It is overlying the sternum, so not really a breast mass to warrant any mammogram.  She may be interested in resection of the mass, but would want to proceed with ultrasound first. --Will order ultrasound and I will call her with results.  If this is a lipoma, we can also schedule procedure visit for excision in the office.  If there are any concerning findings, we may need to order biopsy first.  She understands this plan and all of her questions have been answered.  Face-to-face time spent with the patient and care providers was 40 minutes, with more than 50% of the time spent counseling, educating, and coordinating care of the patient.     Melvyn Neth, [facility]

## 2021-03-07 ENCOUNTER — Other Ambulatory Visit (INDEPENDENT_AMBULATORY_CARE_PROVIDER_SITE_OTHER): Payer: PPO

## 2021-03-07 ENCOUNTER — Other Ambulatory Visit: Payer: Self-pay

## 2021-03-07 DIAGNOSIS — E039 Hypothyroidism, unspecified: Secondary | ICD-10-CM | POA: Diagnosis not present

## 2021-03-07 LAB — TSH: TSH: 0.02 u[IU]/mL — ABNORMAL LOW (ref 0.35–5.50)

## 2021-03-12 ENCOUNTER — Other Ambulatory Visit: Payer: Self-pay

## 2021-03-12 ENCOUNTER — Telehealth: Payer: Self-pay | Admitting: *Deleted

## 2021-03-12 ENCOUNTER — Ambulatory Visit
Admission: RE | Admit: 2021-03-12 | Discharge: 2021-03-12 | Disposition: A | Discharge status: Home / Self Care | Payer: PPO | Source: Ambulatory Visit | Attending: Surgery | Admitting: Surgery

## 2021-03-12 DIAGNOSIS — D171 Benign lipomatous neoplasm of skin and subcutaneous tissue of trunk: Secondary | ICD-10-CM | POA: Insufficient documentation

## 2021-03-12 DIAGNOSIS — E039 Hypothyroidism, unspecified: Secondary | ICD-10-CM

## 2021-03-12 DIAGNOSIS — R222 Localized swelling, mass and lump, trunk: Secondary | ICD-10-CM | POA: Diagnosis not present

## 2021-03-12 MED ORDER — LEVOTHYROXINE SODIUM 137 MCG PO TABS: 137.0000 ug | ORAL_TABLET | Freq: Every day | ORAL | 1 refills | Status: DC

## 2021-03-12 NOTE — Telephone Encounter (Signed)
This looks like an issue with auto refill.  Would change to 137 mcg and take 1 daily.  New rx sent.  Recheck TSH in about 2 months.  Needs a lab visit.  Thanks.

## 2021-03-12 NOTE — Telephone Encounter (Signed)
Spoke to pt and relayed Dr. Josefine Class message. Pt states that she will call back to make the lab only appt.

## 2021-03-12 NOTE — Telephone Encounter (Signed)
Pt left a message at triage. She has her 150 mcg of thyroid med ready for pick up but didn't know if PCP was changing dose after her last labs. Pt is requesting a call back to let her know what strength of med she should be on.  (See result notes)

## 2021-03-14 ENCOUNTER — Telehealth: Payer: Self-pay

## 2021-03-14 NOTE — Telephone Encounter (Signed)
Message left for the patient to call back to get results of her ultrasound and to see if she would like to get this removed in office with Dr Hampton Abbot.

## 2021-03-14 NOTE — Telephone Encounter (Signed)
-----   Message from Jules Husbands, MD sent at 03/14/2021 10:39 AM EDT ----- Please let her know that the ultrasound confirmed lipoma.  Follow-up with Dr. Hampton Abbot ----- Message ----- From: Interface, Rad Results In Sent: 03/14/2021   9:21 AM EDT To: Olean Ree, MD

## 2021-03-16 ENCOUNTER — Other Ambulatory Visit: Payer: Self-pay | Admitting: Gynecologic Oncology

## 2021-03-16 DIAGNOSIS — D072 Carcinoma in situ of vagina: Secondary | ICD-10-CM

## 2021-03-17 ENCOUNTER — Other Ambulatory Visit: Payer: Self-pay

## 2021-03-17 MED ORDER — FLUOXETINE HCL 20 MG PO CAPS: ORAL_CAPSULE | ORAL | 3 refills | Status: DC

## 2021-03-17 NOTE — Telephone Encounter (Signed)
-----   Message from Jules Husbands, MD sent at 03/14/2021 10:39 AM EDT ----- Please let her know that the ultrasound confirmed lipoma.  Follow-up with Dr. Hampton Abbot ----- Message ----- From: Interface, Rad Results In Sent: 03/14/2021   9:21 AM EDT To: Olean Ree, MD

## 2021-03-24 ENCOUNTER — Encounter: Payer: Self-pay | Admitting: Surgery

## 2021-03-24 ENCOUNTER — Other Ambulatory Visit: Payer: Self-pay

## 2021-03-24 ENCOUNTER — Ambulatory Visit: Payer: PPO | Admitting: Surgery

## 2021-03-24 VITALS — BP 132/61 | HR 71 | Temp 98.5°F | Ht 63.0 in | Wt 133.6 lb

## 2021-03-24 DIAGNOSIS — D171 Benign lipomatous neoplasm of skin and subcutaneous tissue of trunk: Secondary | ICD-10-CM

## 2021-03-24 NOTE — Patient Instructions (Addendum)
You may try Tylenol / Ibuprofen for the discomfort. Please wear a sports bra for the next week to help reduce/or minimize fluid collection in the cavity.     Today we have removed a Lipoma in our office. Please see information below regarding this type of tumor.  You are free to shower in 48 hours. This will be on 03/25/21.  You have glue on your skin and sutures under the skin. The glue will come off on it's own in 10-14 days. You may shower normally until this occurs but do not submerge.  Please use Tylenol or Ibuprofen for pain as needed.  We will see you back in 2 weeks to ensure that this has healed and to review the final pathology. Please see your appointment below. You may continue your regular activities right away but if you are having pain while doing something, stop what you are doing and try this activity once again in 3 days. Please call our office with any questions or concerns prior to your appointment.   Lipoma Removal Lipoma removal is a surgical procedure to remove a noncancerous (benign) tumor that is made up of fat cells (lipoma). Most lipomas are small and painless and do not require treatment. They can form in many areas of the body but are most common under the skin of the back, shoulders, arms, and thighs. You may need lipoma removal if you have a lipoma that is large, growing, or causing discomfort. Lipoma removal may also be done for cosmetic reasons. Tell a health care provider about: Any allergies you have. All medicines you are taking, including vitamins, herbs, eye drops, creams, and over-the-counter medicines. Any problems you or family members have had with anesthetic medicines. Any blood disorders you have. Any surgeries you have had. Any medical conditions you have. Whether you are pregnant or may be pregnant. What are the risks? Generally, this is a safe procedure. However, problems may occur, including: Infection. Bleeding. Allergic reactions to  medicines. Damage to nerves or blood vessels near the lipoma. Scarring.  What happens before the procedure? Staying hydrated Follow instructions from your health care provider about hydration, which may include: Up to 2 hours before the procedure - you may continue to drink clear liquids, such as water, clear fruit juice, black coffee, and plain tea.  Eating and drinking restrictions Follow instructions from your health care provider about eating and drinking, which may include: 8 hours before the procedure - stop eating heavy meals or foods such as meat, fried foods, or fatty foods. 6 hours before the procedure - stop eating light meals or foods, such as toast or cereal. 6 hours before the procedure - stop drinking milk or drinks that contain milk. 2 hours before the procedure - stop drinking clear liquids.  Medicines Ask your health care provider about: Changing or stopping your regular medicines. This is especially important if you are taking diabetes medicines or blood thinners. Taking medicines such as aspirin and ibuprofen. These medicines can thin your blood. Do not take these medicines before your procedure if your health care provider instructs you not to. You may be given antibiotic medicine to help prevent infection. General instructions Ask your health care provider how your surgical site will be marked or identified. You will have a physical exam. Your health care provider will check the size of the lipoma and whether it can be moved easily. You may have imaging tests, such as: X-rays. CT scan. MRI. Plan to have someone take  you home from the hospital or clinic. What happens during the procedure? To reduce your risk of infection: Your health care team will wash or sanitize their hands. Your skin will be washed with soap. You will be given one or more of the following: A medicine to help you relax (sedative). A medicine to numb the area (local anesthetic). A medicine  to make you fall asleep (general anesthetic). A medicine that is injected into an area of your body to numb everything below the injection site (regional anesthetic). An incision will be made over the lipoma or very near the lipoma. The incision may be made in a natural skin line or crease. Tissues, nerves, and blood vessels near the lipoma will be moved out of the way. The lipoma and the capsule that surrounds it will be separated from the surrounding tissues. The lipoma will be removed. The incision may be closed with stitches (sutures). A bandage (dressing) will be placed over the incision. What happens after the procedure? Do not drive for 24 hours if you received a sedative. Your blood pressure, heart rate, breathing rate, and blood oxygen level will be monitored until the medicines you were given have worn off. This information is not intended to replace advice given to you by your health care provider. Make sure you discuss any questions you have with your health care provider. Document Released: 10/17/2015 Document Revised: 01/09/2016 Document Reviewed: 10/17/2015 Elsevier Interactive Patient Education  [employer].

## 2021-03-24 NOTE — Progress Notes (Signed)
  Procedure Date:  03/24/2021  Pre-operative Diagnosis:  Anterior chest wall lipoma  Post-operative Diagnosis:  Anterior chest wall lipomas  Procedure:  Excision of total 4 cm anterior chest wall lipomas  Surgeon:  Melvyn Neth, MD  Anesthesia:  15 ml of 1% lidocaine with epi  Estimated Blood Loss:  5 ml  Specimens:  lipoma x 2  Complications:  None  Indications for Procedure:  This is a 65 y.o. female with diagnosis of a symptomatic anterior chest wall lipoma.  This is located anterior to the mid/upper sternum.The patient wishes to have this excised. The risks of bleeding, abscess or infection, injury to surrounding structures, and need for further procedures were all discussed with the patient and she was willing to proceed.  Description of Procedure: The patient was correctly identified at bedside.  The patient was placed supine.  Appropriate time-outs were performed.  The patient's anterior chest wall was prepped and draped in usual sterile fashion.  Local anesthetic was infused intradermally.  A 2.5 cm  vertical incision was made over the lipoma, and scalpel was used to dissect down the skin and subcutaneous tissue.  Skin flaps were created sharply, and then the mass was excised intact.  This revealed an additional lipoma deeper to the first one.  This was also excised sharply using scalpel.  Both combined to a total size of about 4 cm.  Both were sent off to pathology.  The cavity was then irrigated and hemostasis was assured with 3-0 Vicryl suture x 1.  The wound was then closed in two layers using 3-0 Vicryl and 4-0 Monocryl.  The incision was cleaned and sealed with DermaBond.  The patient tolerated the procedure well and all sharps were appropriately disposed of at the end of the case.  --Patient may shower tomorrow. --May take tylenol and ibuprofen for pain control. --Advised to wear a tighter sports bra to help with compression, and advised of activity  restrictions. --Follow up next week.   Melvyn Neth, MD

## 2021-04-02 ENCOUNTER — Encounter: Payer: Self-pay | Admitting: Surgery

## 2021-04-02 ENCOUNTER — Ambulatory Visit (INDEPENDENT_AMBULATORY_CARE_PROVIDER_SITE_OTHER): Payer: PPO | Admitting: Surgery

## 2021-04-02 ENCOUNTER — Other Ambulatory Visit: Payer: Self-pay

## 2021-04-02 VITALS — BP 146/76 | HR 65 | Temp 98.6°F | Ht 63.0 in | Wt 133.4 lb

## 2021-04-02 DIAGNOSIS — Z09 Encounter for follow-up examination after completed treatment for conditions other than malignant neoplasm: Secondary | ICD-10-CM

## 2021-04-02 DIAGNOSIS — D171 Benign lipomatous neoplasm of skin and subcutaneous tissue of trunk: Secondary | ICD-10-CM

## 2021-04-02 NOTE — Progress Notes (Signed)
04/02/2021  HPI: Abigail Marks is a 65 y.o. female s/p excision of anterior chest wall lipomas on 03/24/21.  She presents today for follow up.  Denies any worsening pain or isues with the incision.  She did get bruising going towards the right breast, but that is improving.  Vital signs: BP (!) 146/76   Pulse 65   Temp 98.6 F (37 C)   Ht '5\' 3"'$  (1.6 m)   Wt 133 lb 6.4 oz (60.5 kg)   SpO2 94%   BMI 23.63 kg/m    Physical Exam: Constitutional: No acute distress Skin:  Incision over anterior chest wall overlying the sternum is healing well.  There is dermabond in place still, trying to peel off.  There is some minor ecchymosis over the upper outer quadrant of the right breast.    Assessment/Plan: This is a 65 y.o. female s/p excision of anterior chest wall lipomas  --Discussed with the patient that the wound is healing well without any complications.  The ecchymosis will continue to improve. --Discussed pathology results with her.  Overall benign results. --Follow up as needed.  Melvyn Neth, [facility]

## 2021-04-02 NOTE — Patient Instructions (Addendum)
The sutures under you skin will dissolve in about 2-3 months. The area should gradually come up.   Follow-up with our office as needed.  Please call and ask to speak with a nurse if you develop questions or concerns.

## 2021-05-29 ENCOUNTER — Other Ambulatory Visit: Payer: Self-pay

## 2021-05-29 ENCOUNTER — Other Ambulatory Visit: Payer: PPO

## 2021-05-29 ENCOUNTER — Other Ambulatory Visit (INDEPENDENT_AMBULATORY_CARE_PROVIDER_SITE_OTHER): Payer: PPO

## 2021-05-29 DIAGNOSIS — E039 Hypothyroidism, unspecified: Secondary | ICD-10-CM | POA: Diagnosis not present

## 2021-05-29 LAB — TSH: TSH: 0.02 u[IU]/mL — ABNORMAL LOW (ref 0.35–5.50)

## 2021-06-02 ENCOUNTER — Other Ambulatory Visit: Payer: Self-pay | Admitting: Family Medicine

## 2021-06-02 DIAGNOSIS — E039 Hypothyroidism, unspecified: Secondary | ICD-10-CM

## 2021-06-02 MED ORDER — LEVOTHYROXINE SODIUM 112 MCG PO TABS: 112.0000 ug | ORAL_TABLET | Freq: Every day | ORAL | 3 refills | Status: DC

## 2021-07-16 DIAGNOSIS — M778 Other enthesopathies, not elsewhere classified: Secondary | ICD-10-CM | POA: Diagnosis not present

## 2021-07-16 DIAGNOSIS — M2041 Other hammer toe(s) (acquired), right foot: Secondary | ICD-10-CM | POA: Diagnosis not present

## 2021-08-05 ENCOUNTER — Other Ambulatory Visit: Payer: Self-pay

## 2021-08-05 ENCOUNTER — Other Ambulatory Visit (INDEPENDENT_AMBULATORY_CARE_PROVIDER_SITE_OTHER): Payer: PPO

## 2021-08-05 DIAGNOSIS — E039 Hypothyroidism, unspecified: Secondary | ICD-10-CM

## 2021-08-06 LAB — TSH: TSH: 0.12 u[IU]/mL — ABNORMAL LOW (ref 0.35–5.50)

## 2021-08-08 ENCOUNTER — Other Ambulatory Visit: Payer: Self-pay | Admitting: Family Medicine

## 2021-08-08 DIAGNOSIS — E039 Hypothyroidism, unspecified: Secondary | ICD-10-CM

## 2021-08-08 MED ORDER — LEVOTHYROXINE SODIUM 100 MCG PO TABS: 100.0000 ug | ORAL_TABLET | Freq: Every day | ORAL | 3 refills | Status: DC

## 2021-09-24 ENCOUNTER — Ambulatory Visit (INDEPENDENT_AMBULATORY_CARE_PROVIDER_SITE_OTHER): Payer: PPO

## 2021-09-24 ENCOUNTER — Other Ambulatory Visit: Payer: Self-pay | Admitting: Gynecologic Oncology

## 2021-09-24 ENCOUNTER — Other Ambulatory Visit: Payer: Self-pay

## 2021-09-24 ENCOUNTER — Ambulatory Visit: Payer: PPO | Admitting: Orthopedic Surgery

## 2021-09-24 ENCOUNTER — Encounter: Payer: Self-pay | Admitting: Orthopedic Surgery

## 2021-09-24 DIAGNOSIS — D072 Carcinoma in situ of vagina: Secondary | ICD-10-CM

## 2021-09-24 DIAGNOSIS — M79671 Pain in right foot: Secondary | ICD-10-CM

## 2021-09-24 NOTE — Progress Notes (Signed)
Office Visit Note   Patient: Abigail Marks           Date of Birth: [birth year]           MRN: [id number] Visit Date: 09/24/2021 Requested by: Tonia Ghent, MD [place],  [place] [address] PCP: Tonia Ghent, MD  Subjective: Chief Complaint  Patient presents with   Right Foot - Pain    HPI: Abigail Marks is a 66 year old patient with right foot pain.  Pain has been going on for 4 months.  Denies any history of injury.  She has seen a podiatrist and was told she had arthritis.  Tried Mobic but really cannot take nonsteroidals.  Reports pain on the dorsal aspect of the third toe as well as metatarsal pain around the second and third metatarsal head.  She states that her foot really hurts with any activity she does and even at night.  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              ROS: All systems reviewed are negative as they relate to the chief complaint within the history of present illness.  Patient denies  fevers or chills.   Assessment & Plan: Visit Diagnoses:  1. Pain in right foot     Plan: Impression is right foot pain.  She has mucous cyst over the DIP joint of the right third toe.  She also has more significant symptoms around the second and third metatarsal head.  Differential diagnosis here would be atypical neuroma versus stress reaction.  She has had symptoms for 4 months with failure of conservative treatment.  Having night pain.  MRI scan indicated to assist with diagnosis and treatment follow-up after that study  Follow-Up Instructions: No follow-ups on file.   Orders:  Orders Placed This Encounter  Procedures   XR Foot Complete Right   MR Foot Right w/o contrast   No orders of the defined types were placed in this encounter.     Procedures: No procedures performed   Clinical Data: No additional findings.  Objective: Vital Signs: There were no vitals taken for this visit.  Physical Exam:    Constitutional: Patient appears well-developed HEENT:  Head: Normocephalic Eyes:EOM are normal Neck: Normal range of motion Cardiovascular: Normal rate Pulmonary/chest: Effort normal Neurologic: Patient is alert Skin: Skin is warm Psychiatric: Patient has normal mood and affect   Ortho Exam: Ortho exam demonstrates full active and passive range of motion of the ankles bilaterally.  No pain with pronation supination of the forefoot.  Palpable intact nontender anterior to posterior to peroneal and Achilles tendons.  No lesser toe deformities present.  Small mucous cyst present on the dorsal aspect of the third toe.  Does have tenderness to palpation on the plantar aspect of the foot over the second and third metatarsal heads without asymmetric swelling compared to the left foot.  Some pain with compression between the metatarsal heads.  No definite paresthesias of the toes.  All toes are perfused and sensate and mobile.  Specialty Comments:  No specialty comments available.  Imaging: XR Foot Complete Right  Result Date: 09/24/2021 AP lateral oblique radiographs right foot reviewed.  Tarsometatarsal alignment intact.  No radiographic evidence of stress reaction or stress fracture.  No significant midfoot arthritis.  Mild to moderate arthritis present in some of the phalangeal joints of the lesser toes.    PMFS History: Patient Active Problem List  Diagnosis Date Noted   Skin lesion 02/26/2021   H/O: hysterectomy 08/29/2020   At high risk for breast cancer 08/29/2020   Osteoporosis 08/29/2020   Dizziness 01/27/2017   Hyperglycemia 10/19/2016   GERD (gastroesophageal reflux disease) 07/06/2014   Advance care planning 04/05/2014   Medicare welcome exam 12/25/2012   Elevated blood pressure reading without diagnosis of hypertension 01/01/2012   Tremor 03/25/2011   UNSPECIFIED PTOSIS OF EYELID 09/28/2007   Hypothyroidism 02/03/2007   HLD (hyperlipidemia) 02/03/2007   Depression  02/03/2007   IBS 02/03/2007   INSOMNIA 02/03/2007   Past Medical History:  Diagnosis Date   Anemia 03/2010   Arthritis    Complication of anesthesia    Depression    GERD (gastroesophageal reflux disease)    Herniated lumbar intervertebral disc 10/2000   Injections   History of hiatal hernia    Hyperlipidemia    Hypothyroidism    PONV (postoperative nausea and vomiting)    Thyroid disease    Hypothyroidism s/p radioactive ablation   Tremor     Family History  Problem Relation Age of Onset   Diabetes Mother    Hypertension Mother    Cancer Mother        breast   Heart disease Mother        MI   Breast cancer Mother    Cancer Sister        Breast   Breast cancer Sister    Heart disease Sister    Healthy Daughter    Hypertension Son    Kidney disease Son    Colon cancer Neg Hx     Past Surgical History:  Procedure Laterality Date   ABDOMINAL HYSTERECTOMY     Partial,prolapse   BLADDER SUSPENSION  03/2005   CATARACT EXTRACTION W/PHACO Right 11/11/2015   Procedure: CATARACT EXTRACTION PHACO AND INTRAOCULAR LENS PLACEMENT ([place]);  Surgeon: Estill Cotta, MD;  Location: [facility] ORS;  Service: Ophthalmology;  Laterality: Right;  [place] 00:55 AP% 23.0 CDE 22.84 fluid pack lot # [id number]   CATARACT EXTRACTION W/PHACO Left 11/01/2015   Procedure: CATARACT EXTRACTION PHACO AND INTRAOCULAR LENS PLACEMENT ([facility]);  Surgeon: Estill Cotta, MD;  Location: [facility] ORS;  Service: Ophthalmology;  Laterality: Left;  [place]            1:05 AP%        23 CDE       28.79       fluid casette lot # [id number]  exp5/31/2018   CHOLECYSTECTOMY     OOPHORECTOMY     bilateral cysts   Social History   Occupational History   Occupation: Nurse, 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

## 2021-10-06 ENCOUNTER — Other Ambulatory Visit (INDEPENDENT_AMBULATORY_CARE_PROVIDER_SITE_OTHER): Payer: PPO

## 2021-10-06 ENCOUNTER — Telehealth: Payer: Self-pay | Admitting: Orthopedic Surgery

## 2021-10-06 ENCOUNTER — Other Ambulatory Visit: Payer: Self-pay

## 2021-10-06 DIAGNOSIS — E039 Hypothyroidism, unspecified: Secondary | ICD-10-CM | POA: Diagnosis not present

## 2021-10-06 LAB — TSH: TSH: 0.78 u[IU]/mL (ref 0.35–5.50)

## 2021-10-06 NOTE — Telephone Encounter (Signed)
LMOM for pt to return call to sch for post MRI after 10/08/21 with GD

## 2021-10-08 ENCOUNTER — Ambulatory Visit
Admission: RE | Admit: 2021-10-08 | Discharge: 2021-10-08 | Disposition: A | Discharge status: Home / Self Care | Payer: PPO | Source: Ambulatory Visit | Attending: Orthopedic Surgery | Admitting: Orthopedic Surgery

## 2021-10-08 ENCOUNTER — Other Ambulatory Visit: Payer: Self-pay

## 2021-10-08 DIAGNOSIS — M79671 Pain in right foot: Secondary | ICD-10-CM

## 2021-10-08 DIAGNOSIS — R600 Localized edema: Secondary | ICD-10-CM | POA: Diagnosis not present

## 2021-10-08 DIAGNOSIS — M19071 Primary osteoarthritis, right ankle and foot: Secondary | ICD-10-CM | POA: Diagnosis not present

## 2021-10-10 ENCOUNTER — Telehealth: Payer: Self-pay | Admitting: Orthopedic Surgery

## 2021-10-10 NOTE — Telephone Encounter (Signed)
Patient called. She would like some pain medication called in for the weekend. Her call back number is [phone]

## 2021-10-13 ENCOUNTER — Other Ambulatory Visit: Payer: Self-pay | Admitting: Surgical

## 2021-10-13 MED ORDER — TRAMADOL HCL 50 MG PO TABS: 50.0000 mg | ORAL_TABLET | Freq: Two times a day (BID) | ORAL | 0 refills | Status: DC | PRN

## 2021-10-13 NOTE — Telephone Encounter (Signed)
Sent in RX for Tramadol, didn't see until today

## 2021-10-15 ENCOUNTER — Ambulatory Visit: Payer: PPO | Admitting: Orthopedic Surgery

## 2021-10-15 ENCOUNTER — Other Ambulatory Visit: Payer: Self-pay

## 2021-10-15 DIAGNOSIS — M79671 Pain in right foot: Secondary | ICD-10-CM

## 2021-10-15 DIAGNOSIS — F430 Acute stress reaction: Secondary | ICD-10-CM | POA: Diagnosis not present

## 2021-10-15 DIAGNOSIS — M810 Age-related osteoporosis without current pathological fracture: Secondary | ICD-10-CM | POA: Diagnosis not present

## 2021-10-15 LAB — VITAMIN D 25 HYDROXY (VIT D DEFICIENCY, FRACTURES): Vit D, 25-Hydroxy: 72 ng/mL (ref 30–100)

## 2021-10-16 NOTE — Progress Notes (Signed)
V d ok pls calal thx

## 2021-10-16 NOTE — Progress Notes (Signed)
IC LMVM advising.  

## 2021-10-17 ENCOUNTER — Encounter: Payer: Self-pay | Admitting: Orthopedic Surgery

## 2021-10-17 NOTE — Progress Notes (Signed)
? ?Office Visit Note ?  ?Patient: Abigail Marks           ?Date of Birth: March 09, 1956           ?MRN: [id number] ?Visit Date: 10/15/2021 ?Requested by: Tonia Ghent, MD ?[address] ?[place],  [place] [address] ?PCP: Tonia Ghent, MD ? ?Subjective: ?Chief Complaint  ?Patient presents with  ? Other  ?  Scan review  ? ? ?HPI: Abigail Marks is a 65 year old patient with significant right foot pain.  Started 6 months ago.  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  The pain is a real burning pain affecting the metatarsal head region.  She has tried all topicals for this problem and she has been taking tramadol.  MRI scan is reviewed and it does show mild marrow edema involving the third proximal phalanx concerning for stress reaction.  Does not really look like infection.  There is some capsular thickening along the plantar aspect of the third MTP joint as well. ?             ?ROS: All systems reviewed are negative as they relate to the chief complaint within the history of present illness.  Patient denies  fevers or chills. ? ? ?Assessment & Plan: ?Visit Diagnoses:  ?1. Pain in right foot   ?2. Stress reaction   ? ? ?Plan: Impression is atypical stress reaction involving the third proximal phalanx.  Plan is hard sole shoes plus bone stem.  Vitamin D level checked and is normal at the time of this dictation.  She is having a fairly significant amount of disability from this particular stress reaction.  Does not look like infection.  Does not look like any type of atypical Morton's neuroma.  Early neuropathy a concern but for now we will try with bone stem and 8-week return. ? ?Follow-Up Instructions: No follow-ups on file.  ? ?Orders:  ?Orders Placed This Encounter  ?Procedures  ? Vitamin D (25 hydroxy)  ? ?No orders of the defined types were placed in this encounter. ? ? ? ? Procedures: ?No procedures performed ? ? ?Clinical Data: ?No additional  findings. ? ?Objective: ?Vital Signs: There were no vitals taken for this visit. ? ?Physical Exam:  ? ?Constitutional: Patient appears well-developed ?HEENT:  ?Head: Normocephalic ?Eyes:EOM are normal ?Neck: Normal range of motion ?Cardiovascular: Normal rate ?Pulmonary/chest: Effort normal ?Neurologic: Patient is alert ?Skin: Skin is warm ?Psychiatric: Patient has normal mood and affect ? ? ?Ortho Exam: Ortho exam demonstrates antalgic gait to the right.  She does have tenderness along that third phalanx.  Mild swelling is present.  No tenderness around the talonavicular joint.  Has symmetric tibiotalar subtalar transverse tarsal range of motion.  No puncture wounds fluctuance erythema or warmth in that right third toe and MTP region. ? ?Specialty Comments:  ?No specialty comments available. ? ?Imaging: ?No results found. ? ? ?PMFS History: ?Patient Active Problem List  ? Diagnosis Date Noted  ? Skin lesion 02/26/2021  ? H/O: hysterectomy 08/29/2020  ? At high risk for breast cancer 08/29/2020  ? Osteoporosis 08/29/2020  ? Dizziness 01/27/2017  ? Hyperglycemia 10/19/2016  ? GERD (gastroesophageal reflux disease) 07/06/2014  ? Advance care planning 04/05/2014  ? Medicare welcome exam 12/25/2012  ? Elevated blood pressure reading without diagnosis of hypertension 01/01/2012  ? Tremor 03/25/2011  ? UNSPECIFIED PTOSIS OF EYELID 09/28/2007  ? Hypothyroidism 02/03/2007  ? HLD (hyperlipidemia)  02/03/2007  ? Depression 02/03/2007  ? IBS 02/03/2007  ? INSOMNIA 02/03/2007  ? ?Past Medical History:  ?Diagnosis Date  ? Anemia 03/2010  ? Arthritis   ? Complication of anesthesia   ? Depression   ? GERD (gastroesophageal reflux disease)   ? Herniated lumbar intervertebral disc 10/2000  ? Injections  ? History of hiatal hernia   ? Hyperlipidemia   ? Hypothyroidism   ? PONV (postoperative nausea and vomiting)   ? Thyroid disease   ? Hypothyroidism s/p radioactive ablation  ? Tremor   ?  ?Family History  ?Problem Relation Age of Onset   ? Diabetes Mother   ? Hypertension Mother   ? Cancer Mother   ?     breast  ? Heart disease Mother   ?     MI  ? Breast cancer Mother   ? Cancer Sister   ?     Breast  ? Breast cancer Sister   ? Heart disease Sister   ? Healthy Daughter   ? Hypertension Son   ? Kidney disease Son   ? Colon cancer Neg Hx   ?  ?Past Surgical History:  ?Procedure Laterality Date  ? ABDOMINAL HYSTERECTOMY    ? Partial,prolapse  ? BLADDER SUSPENSION  03/2005  ? CATARACT EXTRACTION W/PHACO Right 11/11/2015  ? Procedure: CATARACT EXTRACTION PHACO AND INTRAOCULAR LENS PLACEMENT ([facility]);  Surgeon: Estill Cotta, MD;  Location: [facility] ORS;  Service: Ophthalmology;  Laterality: Right;  [place] 00:55 ?AP% 23.0 ?CDE 22.84 ?fluid pack lot # [id number]  ? CATARACT EXTRACTION W/PHACO Left 11/01/2015  ? Procedure: CATARACT EXTRACTION PHACO AND INTRAOCULAR LENS PLACEMENT ([facility]);  Surgeon: Estill Cotta, MD;  Location: [facility] OR[facility];  Service: Ophthalmology;  Laterality: Left;  [place]            1:05 ?AP%        23 ?CDE       28.79       ?fluid casette lot # [id number]  exp5/31/2018  ? CHOLECYSTECTOMY    ? OOPHORECTOMY    ? bilateral cysts  ? ?Social History  ? ?Occupational History  ? 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 Smoking status: Former  ?  Types: Cigarettes  ?  Quit date: 08/17/2004  ?  Years since quitting: 17.1  ? Smokeless tobacco: Never  ?Vaping Use  ? Vaping Use: Never used  ?Substance and Sexual Activity  ? Alcohol use: Yes  ?  Alcohol/week: 0.0 standard drinks  ?  Comment: occ  ? Drug use: No  ? Sexual activity: Not on file  ? ? ? ? ? ? ?

## 2021-10-21 DIAGNOSIS — N952 Postmenopausal atrophic vaginitis: Secondary | ICD-10-CM | POA: Diagnosis not present

## 2021-10-21 DIAGNOSIS — Z87411 Personal history of vaginal dysplasia: Secondary | ICD-10-CM | POA: Diagnosis not present

## 2021-10-21 DIAGNOSIS — Z124 Encounter for screening for malignant neoplasm of cervix: Secondary | ICD-10-CM | POA: Diagnosis not present

## 2021-10-21 DIAGNOSIS — Z1231 Encounter for screening mammogram for malignant neoplasm of breast: Secondary | ICD-10-CM | POA: Diagnosis not present

## 2021-10-21 DIAGNOSIS — N760 Acute vaginitis: Secondary | ICD-10-CM | POA: Diagnosis not present

## 2021-10-21 DIAGNOSIS — M810 Age-related osteoporosis without current pathological fracture: Secondary | ICD-10-CM | POA: Diagnosis not present

## 2021-10-21 DIAGNOSIS — Z1272 Encounter for screening for malignant neoplasm of vagina: Secondary | ICD-10-CM | POA: Diagnosis not present

## 2021-10-21 DIAGNOSIS — F5104 Psychophysiologic insomnia: Secondary | ICD-10-CM | POA: Diagnosis not present

## 2021-10-21 DIAGNOSIS — R300 Dysuria: Secondary | ICD-10-CM | POA: Diagnosis not present

## 2021-10-21 DIAGNOSIS — Z6824 Body mass index (BMI) 24.0-24.9, adult: Secondary | ICD-10-CM | POA: Diagnosis not present

## 2021-10-23 ENCOUNTER — Other Ambulatory Visit: Payer: Self-pay | Admitting: Obstetrics and Gynecology

## 2021-10-23 DIAGNOSIS — R928 Other abnormal and inconclusive findings on diagnostic imaging of breast: Secondary | ICD-10-CM

## 2021-10-29 DIAGNOSIS — H353132 Nonexudative age-related macular degeneration, bilateral, intermediate dry stage: Secondary | ICD-10-CM | POA: Diagnosis not present

## 2021-10-30 DIAGNOSIS — H26491 Other secondary cataract, right eye: Secondary | ICD-10-CM | POA: Diagnosis not present

## 2021-11-03 ENCOUNTER — Other Ambulatory Visit: Payer: Self-pay | Admitting: Surgical

## 2021-11-13 ENCOUNTER — Ambulatory Visit
Admission: RE | Admit: 2021-11-13 | Discharge: 2021-11-13 | Disposition: A | Discharge status: Home / Self Care | Payer: PPO | Source: Ambulatory Visit | Attending: Obstetrics and Gynecology | Admitting: Obstetrics and Gynecology

## 2021-11-13 DIAGNOSIS — R922 Inconclusive mammogram: Secondary | ICD-10-CM | POA: Diagnosis not present

## 2021-11-13 DIAGNOSIS — R928 Other abnormal and inconclusive findings on diagnostic imaging of breast: Secondary | ICD-10-CM

## 2021-11-13 DIAGNOSIS — N6311 Unspecified lump in the right breast, upper outer quadrant: Secondary | ICD-10-CM | POA: Diagnosis not present

## 2021-11-19 ENCOUNTER — Other Ambulatory Visit: Payer: Self-pay

## 2021-11-19 MED ORDER — LEVOTHYROXINE SODIUM 100 MCG PO TABS: 100.0000 ug | ORAL_TABLET | Freq: Every day | ORAL | 3 refills | Status: DC

## 2021-11-19 NOTE — Progress Notes (Signed)
Sent. Thanks.   

## 2021-11-27 DIAGNOSIS — H353132 Nonexudative age-related macular degeneration, bilateral, intermediate dry stage: Secondary | ICD-10-CM | POA: Diagnosis not present

## 2021-12-02 ENCOUNTER — Other Ambulatory Visit: Payer: Self-pay | Admitting: Family Medicine

## 2021-12-11 ENCOUNTER — Other Ambulatory Visit: Payer: Self-pay | Admitting: Family Medicine

## 2021-12-13 ENCOUNTER — Other Ambulatory Visit: Payer: Self-pay

## 2021-12-13 MED ORDER — SIMVASTATIN 20 MG PO TABS: ORAL_TABLET | ORAL | 1 refills | Status: DC

## 2021-12-16 DIAGNOSIS — N644 Mastodynia: Secondary | ICD-10-CM | POA: Diagnosis not present

## 2021-12-25 ENCOUNTER — Telehealth: Payer: Self-pay | Admitting: Family Medicine

## 2021-12-25 NOTE — Telephone Encounter (Signed)
Left message for patient to call back and schedule Medicare Annual Wellness Visit (AWVI) to be completed by video or phone. ? ? ? ?Last AWV: 10/31/2020 ? ? ? ?Please schedule AWVI at anytime with  ?[facility]   ? ? ? ?45 minute appointment ? ? ? ?Any questions, please contact me at [phone] ?

## 2021-12-31 ENCOUNTER — Ambulatory Visit (INDEPENDENT_AMBULATORY_CARE_PROVIDER_SITE_OTHER): Payer: PPO

## 2021-12-31 VITALS — Wt 133.0 lb

## 2021-12-31 DIAGNOSIS — Z0000 Encounter for general adult medical examination without abnormal findings: Secondary | ICD-10-CM | POA: Diagnosis not present

## 2021-12-31 NOTE — Progress Notes (Signed)
?Virtual Visit via Telephone Note ? ?I connected with  Abigail Marks on 12/31/21 at  1:00 PM EDT by telephone and verified that I am speaking with the correct person using two identifiers. ? ?Location: ?Patient: home ?Provider: [facility] ?Persons participating in the virtual visit: patient/Nurse Health Advisor ?  ?I discussed the limitations, risks, security and privacy concerns of performing an evaluation and management service by telephone and the availability of in person appointments. The patient expressed understanding and agreed to proceed. ? ?Interactive audio and video telecommunications were attempted between this nurse and patient, however failed, due to patient having technical difficulties OR patient did not have access to video capability.  We continued and completed visit with audio only. ? ?Some vital signs may be absent or patient reported.  ? ?Dionisio David, LPN ? ?Subjective:  ? Abigail Marks is a 66 y.o. female who presents for Medicare Annual (Subsequent) preventive examination. ? ?Review of Systems    ? ?  ? ?   ?Objective:  ?  ?There were no vitals filed for this visit. ?There is no height or weight on file to calculate BMI. ? ? ?  02/27/2020  ?  6:10 PM 11/15/2019  ?  9:20 AM 03/15/2019  ? 10:52 AM 02/20/2019  ?  9:47 AM 11/11/2015  ? 11:01 AM 11/01/2015  ?  6:07 AM  ?Advanced Directives  ?Does Patient Have a Medical Advance Directive? Yes Yes Yes Yes Yes No  ?Type of Paramedic of [place];Living will [place];Living will [place];Living will Living will;Healthcare Power of [place]   ?Copy of [place] in Chart?   No - copy requested  No - copy requested   ?Would patient like information on creating a medical advance directive? No - Patient declined     No - patient declined information  ? ? ?Current Medications (verified) ?Outpatient Encounter Medications as of 12/31/2021   ?Medication Sig  ? Calcium Carbonate-Vitamin D 600-400 MG-UNIT tablet Take 2 tablets by mouth daily.   ? FLUoxetine (PROZAC) 20 MG capsule TAKE 2 CAPSULES(40 MG) BY MOUTH DAILY  ? levothyroxine (SYNTHROID) 100 MCG tablet Take 1 tablet (100 mcg total) by mouth daily before breakfast.  ? levothyroxine (SYNTHROID) 112 MCG tablet Take 112 mcg by mouth every morning.  ? meloxicam (MOBIC) 15 MG tablet Take 15 mg by mouth daily.  ? metroNIDAZOLE (FLAGYL) 500 MG tablet metronidazole 500 mg tablet  ? Multiple Vitamin (MULTIVITAMIN) tablet Take 1 tablet by mouth daily.  ? PREMARIN vaginal cream INSERT 1 APPLICATORFUL VAGINALLY 1 TIME A WEEK  ? primidone (MYSOLINE) 50 MG tablet TAKE 1 TABLET BY MOUTH AT BEDTIME  ? simvastatin (ZOCOR) 20 MG tablet TAKE 1 TABLET(20 MG) BY MOUTH DAILY  ? traMADol (ULTRAM) 50 MG tablet Take 1 tablet (50 mg total) by mouth daily as needed.  ? zolpidem (AMBIEN) 5 MG tablet zolpidem 5 mg tablet ? Take 1 tablet as needed by oral route at bedtime.  ? [DISCONTINUED] Fluoxetine HCl, PMDD, 10 MG TABS Take 1 tablet (10 mg total) by mouth daily. Please ask patient to make an appointment for physical exam prior to further refills.  ? ?No facility-administered encounter medications on file as of 12/31/2021.  ? ? ?Allergies (verified) ?Atorvastatin, Bupropion hcl, Ezetimibe-simvastatin, Lunesta [eszopiclone], Mirtazapine, Nsaids, Omeprazole-sodium bicarbonate, Paroxetine, Raloxifene, Rosuvastatin, Temazepam, Trazodone and nefazodone, and Zolpidem tartrate  ? ?History: ?Past Medical History:  ?Diagnosis Date  ?  Anemia 03/2010  ? Arthritis   ? Complication of anesthesia   ? Depression   ? GERD (gastroesophageal reflux disease)   ? Herniated lumbar intervertebral disc 10/2000  ? Injections  ? History of hiatal hernia   ? Hyperlipidemia   ? Hypothyroidism   ? PONV (postoperative nausea and vomiting)   ? Thyroid disease   ? Hypothyroidism s/p radioactive ablation  ? Tremor   ? ?Past Surgical History:  ?Procedure  Laterality Date  ? ABDOMINAL HYSTERECTOMY    ? Partial,prolapse  ? BLADDER SUSPENSION  03/2005  ? CATARACT EXTRACTION W/PHACO Right 11/11/2015  ? Procedure: CATARACT EXTRACTION PHACO AND INTRAOCULAR LENS PLACEMENT ([facility]);  Surgeon: Estill Cotta, MD;  Location: [facility] ORS;  Service: Ophthalmology;  Laterality: Right;  [place] 00:55 ?AP% 23.0 ?CDE 22.84 ?fluid pack lot # [id number]  ? CATARACT EXTRACTION W/PHACO Left 11/01/2015  ? Procedure: CATARACT EXTRACTION PHACO AND INTRAOCULAR LENS PLACEMENT ([facility]);  Surgeon: Estill Cotta, MD;  Location: [facility] OR[facility];  Service: Ophthalmology;  Laterality: Left;  [place]            1:05 ?AP%        23 ?CDE       28.79       ?fluid casette lot # [id number]  exp5/31/2018  ? CHOLECYSTECTOMY    ? OOPHORECTOMY    ? bilateral cysts  ? ?Family History  ?Problem Relation Age of Onset  ? Diabetes Mother   ? Hypertension Mother   ? Cancer Mother   ?     breast  ? Heart disease Mother   ?     MI  ? Breast cancer Mother   ? Cancer Sister   ?     Breast  ? Breast cancer Sister   ? Heart disease Sister   ? Healthy Daughter   ? Hypertension Son   ? Kidney disease Son   ? Colon cancer Neg Hx   ? ?Social History  ? ?Socioeconomic History  ? Marital status: Widowed  ?  Spouse name: Not on file  ? Number of children: 2  ? Years of education: Not on file  ? Highest education level: Associate degree: academic program  ?Occupational History  ? 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 Smoking status: Former  ?  Types: Cigarettes  ?  Quit date: 08/17/2004  ?  Years since quitting: 17.3  ? Smokeless tobacco: Never  ?Vaping Use  ? Vaping Use: Never used  ?Substance and Sexual Activity  ? Alcohol use: Yes  ?  Alcohol/week: 0.0 standard drinks  ?  Comment: occ  ? Drug use: No  ? Sexual activity: Not on file  ?Other Topics Concern  ? Not on file  ?Social History Narrative  ? From [place].  ? 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  ? Widowed 12/2017.  ? Daughter moved in with patient.     ? ?Social Determinants of Health  ? ?Financial Resource Strain: Not on file  ?Food Insecurity: Not on file  ?Transportation Needs: Not on file  ?Physical Activity: Not on file  ?Stress: Not on file  ?Social Connections: Not on file  ? ? ?Tobacco Counseling ?Counseling given: Not Answered ? ? ?Clinical Intake: ? ?Pre-visit preparation completed: Yes ? ?Pain : No/denies pain ? ?  ? ?Nutritional Risks: None ?Diabetes: No ? ?How often do you need to have someone help you when you read instructions, pamphlets, or other written materials from your doctor or  pharmacy?: 1 - Never ? ?Diabetic?no ? ?Interpreter Needed?: No ? ?Information entered by :: Kirke Shaggy, LPN ? ? ?Activities of Daily Living ? ?  12/31/2021  ? 11:59 AM  ?In your present state of health, do you have any difficulty performing the following activities:  ?Hearing? 0  ?Vision? 0  ?Difficulty concentrating or making decisions? 1  ?Walking or climbing stairs? 0  ?Dressing or bathing? 0  ?Doing errands, shopping? 0  ?Preparing Food and eating ? N  ?Using the Toilet? N  ?In the past six months, have you accidently leaked urine? N  ?Do you have problems with loss of bowel control? N  ?Managing your Medications? N  ?Managing your Finances? N  ?Housekeeping or managing your Housekeeping? N  ? ? ?Patient Care Team: ?Tonia Ghent, MD as PCP - General ? ?Indicate any recent Medical Services you may have received from other than [facility] providers in the past year (date may be approximate). ? ?   ?Assessment:  ? This is a routine wellness examination for Stina. ? ?Hearing/Vision screen ?No results found. ? ?Dietary issues and exercise activities discussed: ?  ? ? Goals Addressed   ?None ?  ? ?Depression Screen ? ?  03/10/2018  ? 11:50 AM 10/19/2016  ?  8:21 AM  ?PHQ 2/9 Scores  ?PHQ - 2 Score 6 0  ?  ?Fall Risk ? ?  12/31/2021  ? 11:59 AM 03/24/2021  ?  1:22 PM 03/05/2021  ?  3:09 PM 10/31/2020  ? 11:15 AM 11/15/2019  ?  9:20 AM  ?Fall Risk   ?Falls in the past year? 1 0 0  0 0  ?Number falls in past yr: 0   0 0  ?Injury with Fall? 0   0 0  ?Risk for fall due to :    No Fall Risks No Fall Risks  ?Follow up    Falls evaluation completed   ? ? ?FALL RISK PREVENTION PERTAINING TO

## 2021-12-31 NOTE — Patient Instructions (Signed)
Abigail Marks , Thank you for taking time to come for your Medicare Wellness Visit. I appreciate your ongoing commitment to your health goals. Please review the following plan we discussed and let me know if I can assist you in the future.   Screening recommendations/referrals: Colonoscopy: FOBT 02/27/20 Mammogram: 11/13/21 Bone Density: GYN will order this Recommended yearly ophthalmology/optometry visit for glaucoma screening and checkup Recommended yearly dental visit for hygiene and checkup  Vaccinations: Influenza vaccine: n/c Pneumococcal vaccine: 10/31/20 Tdap vaccine: 12/23/12 Shingles vaccine: Zostavax 08/18/15   Covid-19:08/21/19, 09/11/19, 08/13/20  Advanced directives: no  Conditions/risks identified: none  Next appointment: Follow up in one year for your annual wellness visit 01/04/23 @ 11:45am by phone   Preventive Care 65 Years and Older, Female Preventive care refers to lifestyle choices and visits with your health care provider that can promote health and wellness. What does preventive care include? A yearly physical exam. This is also called an annual well check. Dental exams once or twice a year. Routine eye exams. Ask your health care provider how often you should have your eyes checked. Personal lifestyle choices, including: Daily care of your teeth and gums. Regular physical activity. Eating a healthy diet. Avoiding tobacco and drug use. Limiting alcohol use. Practicing safe sex. Taking low-dose aspirin every day. Taking vitamin and mineral supplements as recommended by your health care provider. What happens during an annual well check? The services and screenings done by your health care provider during your annual well check will depend on your age, overall health, lifestyle risk factors, and family history of disease. Counseling  Your health care provider may ask you questions about your: Alcohol use. Tobacco use. Drug use. Emotional well-being. Home and  relationship well-being. Sexual activity. Eating habits. History of falls. Memory and ability to understand (cognition). Work and work Astronomer. Reproductive health. Screening  You may have the following tests or measurements: Height, weight, and BMI. Blood pressure. Lipid and cholesterol levels. These may be checked every 5 years, or more frequently if you are over 95 years old. Skin check. Lung cancer screening. You may have this screening every year starting at age 37 if you have a 30-pack-year history of smoking and currently smoke or have quit within the past 15 years. Fecal occult blood test (FOBT) of the stool. You may have this test every year starting at age 68. Flexible sigmoidoscopy or colonoscopy. You may have a sigmoidoscopy every 5 years or a colonoscopy every 10 years starting at age 68. Hepatitis C blood test. Hepatitis B blood test. Sexually transmitted disease (STD) testing. Diabetes screening. This is done by checking your blood sugar (glucose) after you have not eaten for a while (fasting). You may have this done every 1-3 years. Bone density scan. This is done to screen for osteoporosis. You may have this done starting at age 68. Mammogram. This may be done every 1-2 years. Talk to your health care provider about how often you should have regular mammograms. Talk with your health care provider about your test results, treatment options, and if necessary, the need for more tests. Vaccines  Your health care provider may recommend certain vaccines, such as: Influenza vaccine. This is recommended every year. Tetanus, diphtheria, and acellular pertussis (Tdap, Td) vaccine. You may need a Td booster every 10 years. Zoster vaccine. You may need this after age 15. Pneumococcal 13-valent conjugate (PCV13) vaccine. One dose is recommended after age 68. Pneumococcal polysaccharide (PPSV23) vaccine. One dose is recommended after age 68. Talk  to your health care provider  about which screenings and vaccines you need and how often you need them. This information is not intended to replace advice given to you by your health care provider. Make sure you discuss any questions you have with your health care provider. Document Released: 08/30/2015 Document Revised: 04/22/2016 Document Reviewed: 06/04/2015 Elsevier Interactive Patient Education  2017 [employer].  Fall Prevention in the Home Falls can cause injuries. They can happen to people of all ages. There are many things you can do to make your home safe and to help prevent falls. What can I do on the outside of my home? Regularly fix the edges of walkways and driveways and fix any cracks. Remove anything that might make you trip as you walk through a door, such as a raised step or threshold. Trim any bushes or trees on the path to your home. Use bright outdoor lighting. Clear any walking paths of anything that might make someone trip, such as rocks or tools. Regularly check to see if handrails are loose or broken. Make sure that both sides of any steps have handrails. Any raised decks and porches should have guardrails on the edges. Have any leaves, snow, or ice cleared regularly. Use sand or salt on walking paths during winter. Clean up any spills in your garage right away. This includes oil or grease spills. What can I do in the bathroom? Use night lights. Install grab bars by the toilet and in the tub and shower. Do not use towel bars as grab bars. Use non-skid mats or decals in the tub or shower. If you need to sit down in the shower, use a plastic, non-slip stool. Keep the floor dry. Clean up any water that spills on the floor as soon as it happens. Remove soap buildup in the tub or shower regularly. Attach bath mats securely with double-sided non-slip rug tape. Do not have throw rugs and other things on the floor that can make you trip. What can I do in the bedroom? Use night lights. Make sure  that you have a light by your bed that is easy to reach. Do not use any sheets or blankets that are too big for your bed. They should not hang down onto the floor. Have a firm chair that has side arms. You can use this for support while you get dressed. Do not have throw rugs and other things on the floor that can make you trip. What can I do in the kitchen? Clean up any spills right away. Avoid walking on wet floors. Keep items that you use a lot in easy-to-reach places. If you need to reach something above you, use a strong step stool that has a grab bar. Keep electrical cords out of the way. Do not use floor polish or wax that makes floors slippery. If you must use wax, use non-skid floor wax. Do not have throw rugs and other things on the floor that can make you trip. What can I do with my stairs? Do not leave any items on the stairs. Make sure that there are handrails on both sides of the stairs and use them. Fix handrails that are broken or loose. Make sure that handrails are as long as the stairways. Check any carpeting to make sure that it is firmly attached to the stairs. Fix any carpet that is loose or worn. Avoid having throw rugs at the top or bottom of the stairs. If you do have throw rugs,  attach them to the floor with carpet tape. Make sure that you have a light switch at the top of the stairs and the bottom of the stairs. If you do not have them, ask someone to add them for you. What else can I do to help prevent falls? Wear shoes that: Do not have high heels. Have rubber bottoms. Are comfortable and fit you well. Are closed at the toe. Do not wear sandals. If you use a stepladder: Make sure that it is fully opened. Do not climb a closed stepladder. Make sure that both sides of the stepladder are locked into place. Ask someone to hold it for you, if possible. Clearly mark and make sure that you can see: Any grab bars or handrails. First and last steps. Where the edge of  each step is. Use tools that help you move around (mobility aids) if they are needed. These include: Canes. Walkers. Scooters. Crutches. Turn on the lights when you go into a dark area. Replace any light bulbs as soon as they burn out. Set up your furniture so you have a clear path. Avoid moving your furniture around. If any of your floors are uneven, fix them. If there are any pets around you, be aware of where they are. Review your medicines with your doctor. Some medicines can make you feel dizzy. This can increase your chance of falling. Ask your doctor what other things that you can do to help prevent falls. This information is not intended to replace advice given to you by your health care provider. Make sure you discuss any questions you have with your health care provider. Document Released: 05/30/2009 Document Revised: 01/09/2016 Document Reviewed: 09/07/2014 Elsevier Interactive Patient Education  2017 [employer].

## 2022-01-13 ENCOUNTER — Encounter: Payer: Self-pay | Admitting: Family Medicine

## 2022-01-13 ENCOUNTER — Telehealth: Payer: Self-pay

## 2022-01-13 ENCOUNTER — Ambulatory Visit (INDEPENDENT_AMBULATORY_CARE_PROVIDER_SITE_OTHER): Payer: PPO | Admitting: Family Medicine

## 2022-01-13 VITALS — BP 122/76 | HR 63 | Temp 97.9°F | Ht 63.0 in | Wt 142.0 lb

## 2022-01-13 DIAGNOSIS — H3530 Unspecified macular degeneration: Secondary | ICD-10-CM

## 2022-01-13 DIAGNOSIS — Z23 Encounter for immunization: Secondary | ICD-10-CM

## 2022-01-13 DIAGNOSIS — H9319 Tinnitus, unspecified ear: Secondary | ICD-10-CM

## 2022-01-13 DIAGNOSIS — R251 Tremor, unspecified: Secondary | ICD-10-CM

## 2022-01-13 DIAGNOSIS — F32A Depression, unspecified: Secondary | ICD-10-CM

## 2022-01-13 DIAGNOSIS — E785 Hyperlipidemia, unspecified: Secondary | ICD-10-CM | POA: Diagnosis not present

## 2022-01-13 DIAGNOSIS — Z1211 Encounter for screening for malignant neoplasm of colon: Secondary | ICD-10-CM

## 2022-01-13 DIAGNOSIS — Z0000 Encounter for general adult medical examination without abnormal findings: Secondary | ICD-10-CM

## 2022-01-13 DIAGNOSIS — E039 Hypothyroidism, unspecified: Secondary | ICD-10-CM | POA: Diagnosis not present

## 2022-01-13 DIAGNOSIS — Z7189 Other specified counseling: Secondary | ICD-10-CM

## 2022-01-13 MED ORDER — FLUOXETINE HCL 20 MG PO CAPS: ORAL_CAPSULE | ORAL | 3 refills | Status: DC

## 2022-01-13 NOTE — Patient Instructions (Addendum)
Go to the lab on the way out.   If you have mychart we'll likely use that to update you.    Take care.  Glad to see you. If your labs are fine, we need to see about options re: mood and fluoxetine.   Let me get the labs and we'll go from there.    If your ear ringing is any worse the let me know so we can refer you to ENT.

## 2022-01-13 NOTE — Progress Notes (Unsigned)
Hypothyroidism.  Recheck pending.  Compliant.  Inc in appetite.    Mood d/w pt.  See above.  Inc appetite.  PHQ score 20.  No SI/HI.  Feels both depressed and anxious, noted in the last year or so.  Effect of fluoxetine has diminished.    Macular degeneration noted, per eye clinic.    She is putting up with B tinnitus.  No balance changes.  No hearing loss.    Elevated Cholesterol: Using medications without problems:  yes Muscle aches: no Diet compliance: d/w pt.   Exercise: d/w pt.    Using premarin cream weekly per gyn clinic.    Tremor.  Worse with more anxiety.  Primidone helped some. No dysphagia.  She has noted more HA in the meantime.    Flu prev done.   Shingles discussed with patient PNA-23 2022 Tetanus 2014 Covid vaccine up-to-date Colonoscopy referral placed in pending Breast cancer screening per gynecology Bone density test per gynecology Advance directive- Lorriane Shire Sweger would be designated if patient were incapacitated.    Meds, vitals, and allergies reviewed.   ROS: Per HPI unless specifically indicated in ROS section   GEN: nad, alert and oriented HEENT: mucous membranes moist, TM wnl, PERRL.   NECK: supple w/o LA CV: rrr.  PULM: ctab, no inc wob ABD: soft, +bs EXT: no edema SKIN: no acute rash B hand tremor noted with hand extension.  CN 2-12 wnl B, S/S wnl x4

## 2022-01-13 NOTE — Telephone Encounter (Signed)
CALLED PATIENT NO ANSWER LEFT VOICEMAIL FOR A CALL BACK ? ?

## 2022-01-14 ENCOUNTER — Other Ambulatory Visit: Payer: Self-pay | Admitting: Family Medicine

## 2022-01-14 DIAGNOSIS — H9319 Tinnitus, unspecified ear: Secondary | ICD-10-CM | POA: Insufficient documentation

## 2022-01-14 DIAGNOSIS — Z0000 Encounter for general adult medical examination without abnormal findings: Secondary | ICD-10-CM | POA: Insufficient documentation

## 2022-01-14 LAB — COMPREHENSIVE METABOLIC PANEL
ALT: 19 U/L (ref 0–35)
AST: 18 U/L (ref 0–37)
Albumin: 4.4 g/dL (ref 3.5–5.2)
Alkaline Phosphatase: 58 U/L (ref 39–117)
BUN: 17 mg/dL (ref 6–23)
CO2: 31 mEq/L (ref 19–32)
Calcium: 9.7 mg/dL (ref 8.4–10.5)
Chloride: 101 mEq/L (ref 96–112)
Creatinine, Ser: 0.73 mg/dL (ref 0.40–1.20)
GFR: 85.1 mL/min (ref >60.00)
Glucose, Bld: 95 mg/dL (ref 70–99)
Potassium: 3.9 mEq/L (ref 3.5–5.1)
Sodium: 138 mEq/L (ref 135–145)
Total Bilirubin: 0.4 mg/dL (ref 0.2–1.2)
Total Protein: 6.8 g/dL (ref 6.0–8.3)

## 2022-01-14 LAB — LDL CHOLESTEROL, DIRECT: Direct LDL: 100 mg/dL

## 2022-01-14 LAB — TSH: TSH: 2.03 u[IU]/mL (ref 0.35–5.50)

## 2022-01-14 LAB — CBC WITH DIFFERENTIAL/PLATELET
Basophils Absolute: 0.1 10*3/uL (ref 0.0–0.1)
Basophils Relative: 1 % (ref 0.0–3.0)
Eosinophils Absolute: 0.1 10*3/uL (ref 0.0–0.7)
Eosinophils Relative: 1.4 % (ref 0.0–5.0)
HCT: 37.2 % (ref 36.0–46.0)
Hemoglobin: 12.4 g/dL (ref 12.0–15.0)
Lymphocytes Relative: 35.6 % (ref 12.0–46.0)
Lymphs Abs: 2.4 10*3/uL (ref 0.7–4.0)
MCHC: 33.4 g/dL (ref 30.0–36.0)
MCV: 95.5 fl (ref 78.0–100.0)
Monocytes Absolute: 0.7 10*3/uL (ref 0.1–1.0)
Monocytes Relative: 10.5 % (ref 3.0–12.0)
Neutro Abs: 3.5 10*3/uL (ref 1.4–7.7)
Neutrophils Relative %: 51.5 % (ref 43.0–77.0)
Platelets: 270 10*3/uL (ref 150.0–400.0)
RBC: 3.89 Mil/uL (ref 3.87–5.11)
RDW: 12.1 % (ref 11.5–15.5)
WBC: 6.8 10*3/uL (ref 4.0–10.5)

## 2022-01-14 LAB — LIPID PANEL
Cholesterol: 177 mg/dL (ref 0–200)
HDL: 55 mg/dL (ref >39.00)
NonHDL: 121.57
Total CHOL/HDL Ratio: 3
Triglycerides: 205 mg/dL — ABNORMAL HIGH (ref 0.0–149.0)
VLDL: 41 mg/dL — ABNORMAL HIGH (ref 0.0–40.0)

## 2022-01-14 LAB — T4, FREE: Free T4: 0.8 ng/dL (ref 0.60–1.60)

## 2022-01-14 MED ORDER — BUSPIRONE HCL 5 MG PO TABS: 5.0000 mg | ORAL_TABLET | Freq: Two times a day (BID) | ORAL | 1 refills | Status: DC

## 2022-01-14 NOTE — Assessment & Plan Note (Signed)
Continue simvastatin, see notes on labs.

## 2022-01-14 NOTE — Assessment & Plan Note (Signed)
Worse with more anxiety.  Primidone helped some. No dysphagia.  She has noted more HA in the meantime.  Continue primidone for now.

## 2022-01-14 NOTE — Assessment & Plan Note (Signed)
Advance directive- Abigail Marks would be designated if patient were incapacitated.

## 2022-01-14 NOTE — Assessment & Plan Note (Signed)
Recheck pending.  Compliant.  Inc in appetite noted.  See notes on labs.

## 2022-01-14 NOTE — Assessment & Plan Note (Signed)
If her ear ringing is any worse then she can let me know so we can refer her to ENT.

## 2022-01-14 NOTE — Assessment & Plan Note (Signed)
Inc appetite.  PHQ score 20.  No SI/HI.  Feels both depressed and anxious, noted in the last year or so.  Effect of fluoxetine has diminished.  We agreed to check labs first.  Continue fluoxetine for now.

## 2022-01-14 NOTE — Assessment & Plan Note (Addendum)
Flu prev done.   Shingles discussed with patient PNA 2023 Tetanus 2014 Covid vaccine up-to-date Colonoscopy referral placed 2023 Breast cancer screening per gynecology Bone density test per gynecology Advance directive- Lorriane Shire Sweger would be designated if patient were incapacitated.

## 2022-01-15 ENCOUNTER — Telehealth: Payer: Self-pay

## 2022-01-15 NOTE — Telephone Encounter (Signed)
CALLED PATIENT NO ANSWER LEFT VOICEMAIL FOR A CALL BACK °Letter sent °

## 2022-01-19 ENCOUNTER — Telehealth: Payer: Self-pay

## 2022-01-19 ENCOUNTER — Other Ambulatory Visit: Payer: Self-pay

## 2022-01-19 DIAGNOSIS — Z1211 Encounter for screening for malignant neoplasm of colon: Secondary | ICD-10-CM

## 2022-01-19 MED ORDER — NA SULFATE-K SULFATE-MG SULF 17.5-3.13-1.6 GM/177ML PO SOLN: 1.0000 | Freq: Once | ORAL | 0 refills | Status: AC

## 2022-01-19 NOTE — Telephone Encounter (Signed)
Gastroenterology Pre-Procedure Review  Request Date: 02/13/22 Requesting Physician: Dr. Vicente Males  PATIENT REVIEW QUESTIONS: The patient responded to the following health history questions as indicated:    1. Are you having any GI issues? no 2. Do you have a personal history of Polyps? yes (2011) 3. Do you have a family history of Colon Cancer or Polyps? No 4. Diabetes Mellitus? no 5. Joint replacements in the past 12 months?no 6. Major health problems in the past 3 months?no 7. Any artificial heart valves, MVP, or defibrillator?no    MEDICATIONS & ALLERGIES:    Patient reports the following regarding taking any anticoagulation/antiplatelet therapy:   Plavix, Coumadin, Eliquis, Xarelto, Lovenox, Pradaxa, Brilinta, or Effient? no Aspirin? no  Patient confirms/reports the following medications:  Current Outpatient Medications  Medication Sig Dispense Refill   busPIRone (BUSPAR) 5 MG tablet Take 1 tablet (5 mg total) by mouth 2 (two) times daily. 60 tablet 1   Calcium Carbonate-Vitamin D 600-400 MG-UNIT tablet Take 2 tablets by mouth daily.      FLUoxetine (PROZAC) 20 MG capsule TAKE 2 CAPSULES(40 MG) BY MOUTH DAILY 180 capsule 3   levothyroxine (SYNTHROID) 100 MCG tablet Take 1 tablet (100 mcg total) by mouth daily before breakfast. 90 tablet 3   Multiple Vitamins-Minerals (ICAPS AREDS 2 PO) Take 1 tablet by mouth in the morning and at bedtime.     PREMARIN vaginal cream INSERT 1 APPLICATORFUL VAGINALLY 1 TIME A WEEK 30 g 1   primidone (MYSOLINE) 50 MG tablet TAKE 1 TABLET BY MOUTH AT BEDTIME 90 tablet 3   simvastatin (ZOCOR) 20 MG tablet TAKE 1 TABLET(20 MG) BY MOUTH DAILY 90 tablet 1   No current facility-administered medications for this visit.    Patient confirms/reports the following allergies:  Allergies  Allergen Reactions   Atorvastatin     REACTION: muscle pain   Bupropion Hcl     REACTION: decreased  BP   Ezetimibe-Simvastatin     REACTION: nausea   Lunesta  [Eszopiclone] Other (See Comments)    Lack of effect   Mirtazapine     REACTION: swelling   Nsaids Other (See Comments)    gi   Omeprazole-Sodium Bicarbonate     REACTION: GI upset   Paroxetine     REACTION: felt bad   Raloxifene     REACTION: leg pain   Rosuvastatin     REACTION: increased ALT   Temazepam     nightmares   Trazodone And Nefazodone     nightmares   Zolpidem Tartrate     intolerant    No orders of the defined types were placed in this encounter.   AUTHORIZATION INFORMATION Primary Insurance: 1D#: Group #:  Secondary Insurance: 1D#: Group #:  SCHEDULE INFORMATION: Date:  Time: Location:

## 2022-02-06 ENCOUNTER — Telehealth: Payer: Self-pay

## 2022-02-10 MED ORDER — FLUOXETINE HCL 20 MG PO CAPS
ORAL_CAPSULE | ORAL | Status: DC
Start: 2022-02-10 — End: 2022-11-25

## 2022-02-10 NOTE — Telephone Encounter (Signed)
Spoke with patient and she really does not want to have to see psychiatry. The buspar is just making her eat way too much; states she stays hungry all the time. Which then affects her mood more cause she is gonna be gaining weight. She does not want to do anything but eat. Patient wants to know if maybe the prozac can be increased and the buspar taken away?

## 2022-02-10 NOTE — Telephone Encounter (Signed)
Patient called back and has been updated with medication changes. Patient will give this a try and call back in a couple weeks with update.

## 2022-02-10 NOTE — Telephone Encounter (Signed)
She can try increasing the prozac to 60mg  and stop the buspar.  It may take a few weeks for the higher dose of prozac to work but she could try that and see how it goes. Please update me as needed.  I updated her med list and noted that she can't take buspar on her intolerance list.  Thanks.

## 2022-02-13 ENCOUNTER — Ambulatory Visit
Admission: RE | Admit: 2022-02-13 | Discharge: 2022-02-13 | Disposition: A | Discharge status: Home / Self Care | Payer: PPO | Attending: Gastroenterology | Admitting: Gastroenterology

## 2022-02-13 ENCOUNTER — Encounter
Admission: RE | Disposition: A | Discharge status: Home / Self Care | Payer: Self-pay | Source: Home / Self Care | Attending: Gastroenterology

## 2022-02-13 ENCOUNTER — Ambulatory Visit: Payer: PPO | Admitting: Certified Registered"

## 2022-02-13 ENCOUNTER — Encounter: Payer: Self-pay | Admitting: Gastroenterology

## 2022-02-13 DIAGNOSIS — F32A Depression, unspecified: Secondary | ICD-10-CM | POA: Insufficient documentation

## 2022-02-13 DIAGNOSIS — Z87891 Personal history of nicotine dependence: Secondary | ICD-10-CM | POA: Insufficient documentation

## 2022-02-13 DIAGNOSIS — E039 Hypothyroidism, unspecified: Secondary | ICD-10-CM | POA: Diagnosis not present

## 2022-02-13 DIAGNOSIS — E785 Hyperlipidemia, unspecified: Secondary | ICD-10-CM | POA: Insufficient documentation

## 2022-02-13 DIAGNOSIS — Z1211 Encounter for screening for malignant neoplasm of colon: Secondary | ICD-10-CM | POA: Diagnosis not present

## 2022-02-13 DIAGNOSIS — E890 Postprocedural hypothyroidism: Secondary | ICD-10-CM | POA: Insufficient documentation

## 2022-02-13 HISTORY — PX: COLONOSCOPY WITH PROPOFOL: SHX5780

## 2022-02-13 SURGERY — COLONOSCOPY WITH PROPOFOL
Anesthesia: General

## 2022-02-13 MED ORDER — STERILE WATER FOR IRRIGATION IR SOLN
Status: DC | PRN
  Administered 2022-02-13: 60 mL

## 2022-02-13 MED ORDER — LIDOCAINE HCL (CARDIAC) PF 100 MG/5ML IV SOSY
PREFILLED_SYRINGE | INTRAVENOUS | Status: DC | PRN
  Administered 2022-02-13: 50 mg via INTRAVENOUS

## 2022-02-13 MED ORDER — SODIUM CHLORIDE 0.9 % IV SOLN
INTRAVENOUS | Status: DC
  Administered 2022-02-13: 20 mL/h via INTRAVENOUS

## 2022-02-13 MED ORDER — PROPOFOL 500 MG/50ML IV EMUL
INTRAVENOUS | Status: DC | PRN
  Administered 2022-02-13: 125 ug/kg/min via INTRAVENOUS

## 2022-02-13 MED ORDER — PROPOFOL 10 MG/ML IV BOLUS
INTRAVENOUS | Status: DC | PRN
  Administered 2022-02-13: 90 mg via INTRAVENOUS

## 2022-02-13 NOTE — Anesthesia Preprocedure Evaluation (Signed)
Anesthesia Evaluation  Patient identified by MRN, date of birth, ID band Patient awake    Reviewed: Allergy & Precautions, NPO status , Patient's Chart, lab work & pertinent test results  Airway Mallampati: II  TM Distance: >3 FB Neck ROM: Full    Dental  (+) Teeth Intact, Implants   Pulmonary neg pulmonary ROS, Patient did not abstain from smoking., former smoker,    Pulmonary exam normal breath sounds clear to auscultation       Cardiovascular Exercise Tolerance: Good negative cardio ROS Normal cardiovascular exam Rhythm:Regular     Neuro/Psych Depression negative neurological ROS  negative psych ROS   GI/Hepatic negative GI ROS, Neg liver ROS, GERD  Medicated,  Endo/Other  negative endocrine ROSHypothyroidism   Renal/GU negative Renal ROS  negative genitourinary   Musculoskeletal   Abdominal Normal abdominal exam  (+)   Peds negative pediatric ROS (+)  Hematology negative hematology ROS (+) Blood dyscrasia, anemia ,   Anesthesia Other Findings Past Medical History: 03/2010: Anemia No date: Arthritis No date: Complication of anesthesia No date: Depression No date: GERD (gastroesophageal reflux disease) 10/2000: Herniated lumbar intervertebral disc     Comment:  Injections No date: History of hiatal hernia No date: Hyperlipidemia No date: Hypothyroidism No date: PONV (postoperative nausea and vomiting) No date: Thyroid disease     Comment:  Hypothyroidism s/p radioactive ablation No date: Tremor  Past Surgical History: No date: ABDOMINAL HYSTERECTOMY     Comment:  Partial,prolapse 03/2005: BLADDER SUSPENSION 11/11/2015: CATARACT EXTRACTION W/PHACO; Right     Comment:  Procedure: CATARACT EXTRACTION PHACO AND INTRAOCULAR               LENS PLACEMENT (IOC);  Surgeon: Estill Cotta, MD;                Location: [facility] ORS;  Service: Ophthalmology;  Laterality:              Right;  [place] 00:55 AP% 23.0 CDE  22.84 fluid pack lot #               [id number] 11/01/2015: CATARACT EXTRACTION W/PHACO; Left     Comment:  Procedure: CATARACT EXTRACTION PHACO AND INTRAOCULAR               LENS PLACEMENT (IOC);  Surgeon: Estill Cotta, MD;                Location: [facility] ORS;  Service: Ophthalmology;  Laterality:              Left;  [place]            1:05 AP%        23 CDE       28.79               fluid casette lot # [id number]  exp5/31/2018 No date: CHOLECYSTECTOMY No date: OOPHORECTOMY     Comment:  bilateral cysts  BMI    Body Mass Index: 24.45 kg/m      Reproductive/Obstetrics negative OB ROS                             Anesthesia Physical Anesthesia Plan  ASA: 2  Anesthesia Plan: General   Post-op Pain Management:    Induction: Intravenous  PONV Risk Score and Plan: Propofol infusion and TIVA  Airway Management Planned: Natural Airway  Additional Equipment:   Intra-op Plan:   Post-operative Plan:   Informed Consent:  I have reviewed the patients History and Physical, chart, labs and discussed the procedure including the risks, benefits and alternatives for the proposed anesthesia with the patient or authorized representative who has indicated his/her understanding and acceptance.     Dental Advisory Given  Plan Discussed with: CRNA and Surgeon  Anesthesia Plan Comments:         Anesthesia Quick Evaluation

## 2022-02-13 NOTE — Op Note (Signed)
[facility] Gastroenterology Patient Name: Abigail Marks Procedure Date: 02/13/2022 10:51 AM MRN: [id number] Account #: [id number] Date of Birth: [birth year] Admit Type: Outpatient Age: 66 Room: [facility] ENDO ROOM 4 Gender: Female Note Status: Finalized Instrument Name: Jasper Riling [id number] Procedure:             Colonoscopy Indications:           Screening for colorectal malignant neoplasm Providers:             Jonathon Bellows MD, MD Referring MD:          Elveria Rising. Damita Dunnings, MD (Referring MD) Medicines:             Monitored Anesthesia Care Complications:         No immediate complications. Procedure:             Pre-Anesthesia Assessment:                        - Prior to the procedure, a History and Physical was                         performed, and patient medications, allergies and                         sensitivities were reviewed. The patient's tolerance                         of previous anesthesia was reviewed.                        - The risks and benefits of the procedure and the                         sedation options and risks were discussed with the                         patient. All questions were answered and informed                         consent was obtained.                        - ASA Grade Assessment: II - A patient with mild                         systemic disease.                        After obtaining informed consent, the colonoscope was                         passed under direct vision. Throughout the procedure,                         the patient's blood pressure, pulse, and oxygen                         saturations were monitored continuously. The                         Colonoscope was  introduced through the anus and                         advanced to the the cecum, identified by the                         appendiceal orifice. The colonoscopy was performed                         with ease. The patient tolerated the procedure  well.                         The quality of the bowel preparation was excellent. Findings:      The entire examined colon appeared normal on direct and retroflexion       views. Impression:            - The entire examined colon is normal on direct and                         retroflexion views.                        - No specimens collected. Recommendation:        - Discharge patient to home (with escort).                        - Resume previous diet.                        - Continue present medications.                        - Repeat colonoscopy in 10 years for screening                         purposes. Procedure Code(s):     --- Professional ---                        [phone], Colonoscopy, flexible; diagnostic, including                         collection of specimen(s) by brushing or washing, when                         performed (separate procedure) Diagnosis Code(s):     --- Professional ---                        Z12.11, Encounter for screening for malignant neoplasm                         of colon CPT copyright 2019 American Medical Association. All rights reserved. The codes documented in this report are preliminary and upon coder review may  be revised to meet current compliance requirements. Jonathon Bellows, MD Jonathon Bellows MD, MD 02/13/2022 11:13:53 AM This report has been signed electronically. Number of Addenda: 0 Note Initiated On: 02/13/2022 10:51 AM Scope Withdrawal Time: 0 hours 10 minutes 26 seconds  Total Procedure Duration: 0 hours 15 minutes 55 seconds  Estimated Blood Loss:  Estimated blood loss: none.  [facility]

## 2022-02-13 NOTE — Anesthesia Postprocedure Evaluation (Signed)
Anesthesia Post Note  Patient: Abigail Marks  Procedure(s) Performed: COLONOSCOPY WITH PROPOFOL  Patient location during evaluation: PACU Anesthesia Type: General Level of consciousness: awake and oriented Pain management: satisfactory to patient Vital Signs Assessment: post-procedure vital signs reviewed and stable Respiratory status: spontaneous breathing and respiratory function stable Cardiovascular status: stable Anesthetic complications: no   No notable events documented.   Last Vitals:  Vitals:   02/13/22 1127 02/13/22 1130  BP: (!) 134/55 (!) 139/51  Pulse: (!) 58 67  Resp: 13 16  Temp:    SpO2: 100% 100%    Last Pain:  Vitals:   02/13/22 1130  TempSrc:   PainSc: 0-No pain                 VAN STAVEREN,Jonet Mathies

## 2022-02-13 NOTE — H&P (Signed)
Jonathon Bellows, MD [address] [place], [place], [place], [address] [address], [place], [place], [place], [address] Phone: [phone]  Fax: [phone]  Primary Care Physician:  Tonia Ghent, MD   Pre-Procedure History & Physical: HPI:  Abigail Marks is a 66 y.o. female is here for an colonoscopy.   Past Medical History:  Diagnosis Date   Anemia 03/2010   Arthritis    Complication of anesthesia    Depression    GERD (gastroesophageal reflux disease)    Herniated lumbar intervertebral disc 10/2000   Injections   History of hiatal hernia    Hyperlipidemia    Hypothyroidism    PONV (postoperative nausea and vomiting)    Thyroid disease    Hypothyroidism s/p radioactive ablation   Tremor     Past Surgical History:  Procedure Laterality Date   ABDOMINAL HYSTERECTOMY     Partial,prolapse   BLADDER SUSPENSION  03/2005   CATARACT EXTRACTION W/PHACO Right 11/11/2015   Procedure: CATARACT EXTRACTION PHACO AND INTRAOCULAR LENS PLACEMENT ([place]);  Surgeon: Estill Cotta, MD;  Location: [facility];  Service: Ophthalmology;  Laterality: Right;  [place] 00:55 AP% 23.0 CDE 22.84 fluid pack lot # [id number]   CATARACT EXTRACTION W/PHACO Left 11/01/2015   Procedure: CATARACT EXTRACTION PHACO AND INTRAOCULAR LENS PLACEMENT ([facility]);  Surgeon: Estill Cotta, MD;  Location: [facility];  Service: Ophthalmology;  Laterality: Left;  [place]            1:05 AP%        23 CDE       28.79       fluid casette lot # [id number]  exp5/31/2018   CHOLECYSTECTOMY     OOPHORECTOMY     bilateral cysts    Prior to Admission medications   Medication Sig Start Date End Date Taking? Authorizing Provider  Calcium Carbonate-Vitamin D 600-400 MG-UNIT tablet Take 2 tablets by mouth daily.    Yes [provider]  FLUoxetine (PROZAC) 20 MG capsule TAKE 3 CAPSULES(60 MG) BY MOUTH DAILY 02/10/22  Yes Tonia Ghent, MD  levothyroxine (SYNTHROID) 100 MCG tablet Take 1 tablet (100 mcg total) by mouth  daily before breakfast. 11/19/21  Yes Tonia Ghent, MD  Multiple Vitamins-Minerals (ICAPS AREDS 2 PO) Take 1 tablet by mouth in the morning and at bedtime.   Yes [provider]  PREMARIN vaginal cream INSERT 1 APPLICATORFUL VAGINALLY 1 TIME A WEEK 03/17/21  Yes Cross, Melissa D, NP  primidone (MYSOLINE) 50 MG tablet TAKE 1 TABLET BY MOUTH AT BEDTIME 12/11/21  Yes Tonia Ghent, MD  simvastatin (ZOCOR) 20 MG tablet TAKE 1 TABLET(20 MG) BY MOUTH DAILY 12/13/21  Yes Tonia Ghent, MD  Fluoxetine HCl, PMDD, 10 MG TABS Take 1 tablet (10 mg total) by mouth daily. Please ask patient to make an appointment for physical exam prior to further refills. 03/14/19 11/15/19  Tonia Ghent, MD    Allergies as of 01/19/2022 - Review Complete 01/19/2022  Allergen Reaction Noted   Atorvastatin  02/03/2007   Bupropion hcl  02/03/2007   Ezetimibe-simvastatin  02/03/2007   Lunesta [eszopiclone] Other (See Comments) 03/10/2018   Mirtazapine  02/03/2007   Nsaids Other (See Comments) 10/22/2015   Omeprazole-sodium bicarbonate  02/03/2007   Paroxetine  02/03/2007   Raloxifene  02/15/2007   Rosuvastatin  02/03/2007   Temazepam  06/08/2011   Trazodone and nefazodone  05/26/2011   Zolpidem tartrate  05/26/2011    Family History  Problem  Relation Age of Onset   Diabetes Mother    Hypertension Mother    Cancer Mother        breast   Heart disease Mother        MI   Breast cancer Mother    Cancer Sister        Breast   Breast cancer Sister    Heart disease Sister    Healthy Daughter    Hypertension Son    Kidney disease Son    Colon cancer Neg Hx     Social History   Socioeconomic History   Marital status: Widowed    Spouse name: Not on file   Number of children: 2   Years of education: Not on file   Highest education level: Associate degree: academic program  Occupational History   Occupation: Nurse, 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   Widowed 12/2017.   Daughter local.   Social Determinants of Health   Financial Resource Strain: Low Risk  (12/31/2021)   Overall Financial Resource Strain (CARDIA)    Difficulty of Paying Living Expenses: Not hard at all  Food Insecurity: No Food Insecurity (12/31/2021)   Hunger Vital Sign    Worried About Running Out of Food in the Last Year: Never true    Ran Out of Food in the Last Year: Never true  Transportation Needs: No Transportation Needs (12/31/2021)   PRAPARE - Hydrologist (Medical): No    Lack of Transportation (Non-Medical): No  Physical Activity: Insufficiently Active (12/31/2021)   Exercise Vital Sign    Days of Exercise per Week: 2 days    Minutes of Exercise per Session: 20 min  Stress: No Stress Concern Present (12/31/2021)   [place]    Feeling of Stress : Only a little  Social Connections: Socially Isolated (12/31/2021)   Social Connection and Isolation Panel [NHANES]    Frequency of Communication with Friends and Family: Three times a week    Frequency of Social Gatherings with Friends and Family: Once a week    Attends Religious Services: Never    Marine scientist or Organizations: No    Attends Archivist Meetings: Never    Marital Status: Widowed  Intimate Partner Violence: Not At Risk (12/31/2021)   Humiliation, Afraid, Rape, and Kick questionnaire    Fear of Current or Ex-Partner: No    Emotionally Abused: No    Physically Abused: No    Sexually Abused: No    Review  of Systems: See HPI, otherwise negative ROS  Physical Exam: BP 128/67   Pulse 77   Temp (!) 96.7 F (35.9 C) (Temporal)   Resp 20   Ht '5\' 3"'$  (1.6 m)   Wt 62.6 kg   SpO2 98%   BMI 24.45 kg/m  General:   Alert,  pleasant and cooperative in NAD Head:  Normocephalic and atraumatic. Neck:  Supple;  no masses or thyromegaly. Lungs:  Clear throughout to auscultation, normal respiratory effort.    Heart:  +S1, +S2, Regular rate and rhythm, No edema. Abdomen:  Soft, nontender and nondistended. Normal bowel sounds, without guarding, and without rebound.   Neurologic:  Alert and  oriented x4;  grossly normal neurologically.  Impression/Plan: Abigail Marks is here for an colonoscopy to be performed for Screening colonoscopy average risk   Risks, benefits, limitations, and alternatives regarding  colonoscopy have been reviewed with the patient.  Questions have been answered.  All parties agreeable.   Jonathon Bellows, MD  02/13/2022, 10:43 AM

## 2022-02-13 NOTE — Transfer of Care (Signed)
Immediate Anesthesia Transfer of Care Note  Patient: Abigail Marks  Procedure(s) Performed: COLONOSCOPY WITH PROPOFOL  Patient Location: PACU  Anesthesia Type:General  Level of Consciousness: drowsy  Airway & Oxygen Therapy: Patient Spontanous Breathing and Patient connected to nasal cannula oxygen  Post-op Assessment: Report given to RN and Post -op Vital signs reviewed and stable  Post vital signs: stable  Last Vitals:  Vitals Value Taken Time  BP 105/51 02/13/22 1117  Temp 36.7 C 02/13/22 1117  Pulse 54 02/13/22 1119  Resp 15 02/13/22 1119  SpO2 100 % 02/13/22 1119  Vitals shown include unvalidated device data.  Last Pain:  Vitals:   02/13/22 1117  TempSrc: Temporal  PainSc: Asleep         Complications: No notable events documented.

## 2022-03-09 ENCOUNTER — Encounter: Payer: Self-pay | Admitting: Family Medicine

## 2022-03-11 ENCOUNTER — Other Ambulatory Visit: Payer: Self-pay | Admitting: Family Medicine

## 2022-03-11 DIAGNOSIS — R5383 Other fatigue: Secondary | ICD-10-CM

## 2022-03-12 ENCOUNTER — Other Ambulatory Visit: Payer: Self-pay | Admitting: Family Medicine

## 2022-03-19 ENCOUNTER — Other Ambulatory Visit (INDEPENDENT_AMBULATORY_CARE_PROVIDER_SITE_OTHER): Payer: PPO

## 2022-03-19 DIAGNOSIS — R5383 Other fatigue: Secondary | ICD-10-CM | POA: Diagnosis not present

## 2022-03-19 LAB — TSH: TSH: 0.77 u[IU]/mL (ref 0.35–5.50)

## 2022-03-19 LAB — VITAMIN B12: Vitamin B-12: 791 pg/mL (ref 211–911)

## 2022-03-20 ENCOUNTER — Ambulatory Visit: Payer: Self-pay

## 2022-03-20 ENCOUNTER — Ambulatory Visit: Payer: PPO | Admitting: Orthopedic Surgery

## 2022-03-20 ENCOUNTER — Ambulatory Visit (INDEPENDENT_AMBULATORY_CARE_PROVIDER_SITE_OTHER): Payer: PPO

## 2022-03-20 VITALS — Ht 62.5 in | Wt 140.0 lb

## 2022-03-20 DIAGNOSIS — M25561 Pain in right knee: Secondary | ICD-10-CM

## 2022-03-20 DIAGNOSIS — M25562 Pain in left knee: Secondary | ICD-10-CM

## 2022-03-20 DIAGNOSIS — M170 Bilateral primary osteoarthritis of knee: Secondary | ICD-10-CM

## 2022-03-21 ENCOUNTER — Encounter: Payer: Self-pay | Admitting: Orthopedic Surgery

## 2022-03-21 MED ORDER — METHYLPREDNISOLONE ACETATE 40 MG/ML IJ SUSP
40.0000 mg | INTRAMUSCULAR | Status: AC | PRN
  Administered 2022-03-20: 40 mg via INTRA_ARTICULAR

## 2022-03-21 MED ORDER — BUPIVACAINE HCL 0.25 % IJ SOLN
4.0000 mL | INTRAMUSCULAR | Status: AC | PRN
  Administered 2022-03-20: 4 mL via INTRA_ARTICULAR

## 2022-03-21 MED ORDER — LIDOCAINE HCL 1 % IJ SOLN
5.0000 mL | INTRAMUSCULAR | Status: AC | PRN
  Administered 2022-03-20: 5 mL

## 2022-03-21 NOTE — Progress Notes (Signed)
Office Visit Note   Patient: Abigail Marks           Date of Birth: [birth year]           MRN: [id number] Visit Date: 03/20/2022 Requested by: Abigail Ghent, MD [address] [place],  [place] [address] PCP: Abigail Ghent, MD  Subjective: Chief Complaint  Patient presents with   Left Knee - Pain   Right Knee - Pain    HPI: Abigail Marks is a 66 year old patient with bilateral knee pain right worse than left.  Has had pain for several months.  Reports weakness and giving way.  The left knee gives way with walking.  Has to use a pillow between her knees at night.  Left thumb swells more frequently but the right swells occasionally.  Uses Tylenol and ibuprofen for symptoms.  Right knee has primarily medial pain.  Does not enjoy a walking or hiking like she used to.  She has a trip to [place] in 10 days and 11 on mission trip in September.              ROS: All systems reviewed are negative as they relate to the chief complaint within the history of present illness.  Patient denies  fevers or chills.   Assessment & Plan: Visit Diagnoses:  1. Pain in both knees, unspecified chronicity   2. Primary osteoarthritis of both knees     Plan: Impression is bilateral knee pain right worse than left.  Radiographs show reasonably well-maintained joint spaces.  No acute fracture.  Bilateral knee injections today.  We will see how she does with that.  Could consider gel injections or repeat cortisone injections in the future.  Worst area of arthritis appears to be the patellofemoral joints. Follow-Up Instructions: No follow-ups on file.   Orders:  Orders Placed This Encounter  Procedures   XR KNEE 3 VIEW RIGHT   XR Knee 1-2 Views Left   No orders of the defined types were placed in this encounter.     Procedures: Large Joint Inj: R knee on 03/20/2022 1:25 PM Indications: diagnostic evaluation, joint swelling and pain Details: 18 G 1.5 in needle, superolateral approach  Arthrogram:  No  Medications: 5 mL lidocaine 1 %; 40 mg methylPREDNISolone acetate 40 MG/ML; 4 mL bupivacaine 0.25 % Outcome: tolerated well, no immediate complications Procedure, treatment alternatives, risks and benefits explained, specific risks discussed. Consent was given by the patient. Immediately prior to procedure a time out was called to verify the correct patient, procedure, equipment, support staff and site/side marked as required. Patient was prepped and draped in the usual sterile fashion.    Large Joint Inj: L knee on 03/20/2022 1:26 PM Indications: diagnostic evaluation, joint swelling and pain Details: 18 G 1.5 in needle, superolateral approach  Arthrogram: No  Medications: 5 mL lidocaine 1 %; 40 mg methylPREDNISolone acetate 40 MG/ML; 4 mL bupivacaine 0.25 % Outcome: tolerated well, no immediate complications Procedure, treatment alternatives, risks and benefits explained, specific risks discussed. Consent was given by the patient. Immediately prior to procedure a time out was called to verify the correct patient, procedure, equipment, support staff and site/side marked as required. Patient was prepped and draped in the usual sterile fashion.       Clinical Data: No additional findings.  Objective: Vital Signs: Ht 5' 2.5" (1.588 m)   Wt 140 lb (63.5 kg)   BMI 25.20 kg/m   Physical Exam:   Constitutional: Patient appears well-developed  HEENT:  Head: Normocephalic Eyes:EOM are normal Neck: Normal range of motion Cardiovascular: Normal rate Pulmonary/chest: Effort normal Neurologic: Patient is alert Skin: Skin is warm Psychiatric: Patient has normal mood and affect   Ortho Exam: Ortho exam demonstrates full active and passive range of motion of both knees.  She has no effusion in either knee.  Patellofemoral crepitus worse on the right compared to the left.  Extensor mechanism intact.  Collateral and cruciate ligaments are stable.  No masses lymphadenopathy or skin changes  noted in that bilateral knee region.  Specialty Comments:  No specialty comments available.  Imaging: No results found.   PMFS History: Patient Active Problem List   Diagnosis Date Noted   Tinnitus 01/14/2022   Healthcare maintenance 01/14/2022   Macular degeneration 01/13/2022   Skin lesion 02/26/2021   H/O: hysterectomy 08/29/2020   At high risk for breast cancer 08/29/2020   Osteoporosis 08/29/2020   Dizziness 01/27/2017   Hyperglycemia 10/19/2016   GERD (gastroesophageal reflux disease) 07/06/2014   Advance care planning 04/05/2014   Medicare welcome exam 12/25/2012   Elevated blood pressure reading without diagnosis of hypertension 01/01/2012   Tremor 03/25/2011   UNSPECIFIED PTOSIS OF EYELID 09/28/2007   Hypothyroidism 02/03/2007   HLD (hyperlipidemia) 02/03/2007   Depression 02/03/2007   IBS 02/03/2007   INSOMNIA 02/03/2007   Past Medical History:  Diagnosis Date   Anemia 03/2010   Arthritis    Complication of anesthesia    Depression    GERD (gastroesophageal reflux disease)    Herniated lumbar intervertebral disc 10/2000   Injections   History of hiatal hernia    Hyperlipidemia    Hypothyroidism    PONV (postoperative nausea and vomiting)    Thyroid disease    Hypothyroidism s/p radioactive ablation   Tremor     Family History  Problem Relation Age of Onset   Diabetes Mother    Hypertension Mother    Cancer Mother        breast   Heart disease Mother        MI   Breast cancer Mother    Cancer Sister        Breast   Breast cancer Sister    Heart disease Sister    Healthy Daughter    Hypertension Son    Kidney disease Son    Colon cancer Neg Hx     Past Surgical History:  Procedure Laterality Date   ABDOMINAL HYSTERECTOMY     Partial,prolapse   BLADDER SUSPENSION  03/2005   CATARACT EXTRACTION W/PHACO Right 11/11/2015   Procedure: CATARACT EXTRACTION PHACO AND INTRAOCULAR LENS PLACEMENT ([facility]);  Surgeon: Estill Cotta, MD;  Location:  [facility];  Service: Ophthalmology;  Laterality: Right;  [place] 00:55 AP% 23.0 CDE 22.84 fluid pack lot # [id number]   CATARACT EXTRACTION W/PHACO Left 11/01/2015   Procedure: CATARACT EXTRACTION PHACO AND INTRAOCULAR LENS PLACEMENT ([facility]);  Surgeon: Estill Cotta, MD;  Location: [facility];  Service: Ophthalmology;  Laterality: Left;  [place]            1:05 AP%        23 CDE       28.79       fluid casette lot # [id number]  exp5/31/2018   CHOLECYSTECTOMY     COLONOSCOPY WITH PROPOFOL N/A 02/13/2022   Procedure: COLONOSCOPY WITH PROPOFOL;  Surgeon: Jonathon Bellows, MD;  Location: [facility] END[facility];  Service: Gastroenterology;  Laterality: N/A;   OOPHORECTOMY     bilateral cysts   Social  History   Occupational History   Occupation: Nurse, 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

## 2022-03-23 ENCOUNTER — Other Ambulatory Visit: Payer: Self-pay | Admitting: *Deleted

## 2022-03-23 NOTE — Patient Outreach (Signed)
  Care Coordination   03/23/2022 Name: Abigail Marks MRN: [id number] DOB: Apr 02, 1956   Care Coordination Outreach Attempts:  An unsuccessful telephone outreach was attempted today to offer the patient information about available care coordination services as a benefit of their health plan.   Follow Up Plan:  Additional outreach attempts will be made to offer the patient care coordination information and services.   Encounter Outcome:  No Answer  Care Coordination Interventions Activated:  Yes   Care Coordination Interventions:  No, not indicated    [place] [facility] [phone]

## 2022-04-22 ENCOUNTER — Telehealth: Payer: Self-pay | Admitting: Family Medicine

## 2022-04-22 MED ORDER — AZITHROMYCIN 500 MG PO TABS: 500.0000 mg | ORAL_TABLET | Freq: Every day | ORAL | 0 refills | Status: AC

## 2022-04-22 NOTE — Telephone Encounter (Signed)
When is she leaving, how long will she be in country and where in the country is she going?  Please let me know and I'll work on it.  Thanks.

## 2022-04-22 NOTE — Telephone Encounter (Signed)
Spoke to patient by telephone and was advised that she will be leaving next Thursday and will be gone for 10 days. Patient stated that she will be going to [place]. Patient stated that they also mentioned anti-malaria pills.

## 2022-04-22 NOTE — Telephone Encounter (Signed)
Spoke to patient by telephone and was advised that she was told to try and get an antibiotic from her PCP to take with her on this trip. Patient stated that there is a lot of upper respiratory infections going on there at this time. Patient stated that she is not sure what antibiotic that they suggested to her. Patient stated that she was told that she would need something to help with an URI if she gets sick. Patient stated that they mentioned malaria but did not suggest medication for that.  [facility]

## 2022-04-22 NOTE — Telephone Encounter (Signed)
Patient called and stated she is going on a medical mission trip to [place] and would like the medication cipro 500 mg twice a day. Call back number [phone]

## 2022-04-22 NOTE — Telephone Encounter (Signed)
CDC does not recommend malaria prophylaxis for [place].  I do not see any reported recent malaria cases in country.  Reasonable to take azithromycin along with a trip in case she has traveler's diarrhea.  Rx sent.  She can take Pepto-Bismol daily/preemptively to limit traveler's diarrhea.  See if she has had the hepatitis A and B series.  If not, I would start that prior to leaving the country.  Thanks.

## 2022-04-24 NOTE — Telephone Encounter (Signed)
Called patient reviewed all information and repeated back to me. Will call if any questions.  She will pick up antibiotic. She has not had any hep A or B. I have reviewed both our chart and [facility]. Patient does not remember getting. She is hesitant to start this close to her trip. Will revisit idea after she gets back.   Patient also wanted to check you had told her to hold her cholesterol medications for a few weeks and let you know how she was doing. States she has had improvement of symptoms after holding medications. Advised patient to continue to hold until Dr. Damita Dunnings has reviewed and given further instructions.

## 2022-04-26 MED ORDER — PRAVASTATIN SODIUM 10 MG PO TABS: 10.0000 mg | ORAL_TABLET | Freq: Every day | ORAL | 3 refills | Status: DC

## 2022-04-26 MED ORDER — SIMVASTATIN 20 MG PO TABS: ORAL_TABLET | ORAL | 1 refills | Status: DC

## 2022-04-26 NOTE — Telephone Encounter (Signed)
Noted.  Would change from simvastatin to pravastatin.  Pravastatin is likely one of the easiest statins to tolerate.  She can make the change after the trip.  Let me know if not tolerated.  Thanks.

## 2022-04-26 NOTE — Addendum Note (Signed)
Addended by: Tonia Ghent on: 04/26/2022 03:38 PM   Modules accepted: Orders

## 2022-04-27 NOTE — Telephone Encounter (Signed)
Left message on voicemail for patient to call the office back. 

## 2022-04-27 NOTE — Telephone Encounter (Signed)
Patient notified as instructed by telephone and verbalized understanding. 

## 2022-05-11 ENCOUNTER — Telehealth (INDEPENDENT_AMBULATORY_CARE_PROVIDER_SITE_OTHER): Payer: PPO | Admitting: Internal Medicine

## 2022-05-11 ENCOUNTER — Encounter: Payer: Self-pay | Admitting: Internal Medicine

## 2022-05-11 DIAGNOSIS — U071 COVID-19: Secondary | ICD-10-CM | POA: Diagnosis not present

## 2022-05-11 MED ORDER — HYDROCODONE BIT-HOMATROP MBR 5-1.5 MG/5ML PO SOLN: 5.0000 mL | Freq: Every evening | ORAL | 0 refills | Status: DC | PRN

## 2022-05-11 MED ORDER — MOLNUPIRAVIR 200 MG PO CAPS
4.0000 | ORAL_CAPSULE | Freq: Two times a day (BID) | ORAL | 0 refills | Status: DC
Start: 2022-05-11 — End: 2022-05-15

## 2022-05-11 NOTE — Assessment & Plan Note (Addendum)
Looks okay No SOB Rapid positive test though--and symptoms just started yesterday Continue tylenol Hydrocodone cough syrup for sleep Discussed antivirals---will start molnupiravir (due to the primidone) Discussed ER if SOB, proper isolation, masks, etc

## 2022-05-11 NOTE — Progress Notes (Signed)
Subjective:    Patient ID: Abigail Marks, female    DOB: Apr 20, 1956, 66 y.o.   MRN: [id number]  HPI Video virtual visit for COVID infection Identification done Reviewed limitations and billing and she gave consent Participants--patient in her home and I am in my office  Sick since yesterday COVID test positive right away Has headache, fever, cough No body aches No SOB  Lives alone  Retired  Pharmacist, hospital --didn't help much  Current Outpatient Medications on File Prior to Visit  Medication Sig Dispense Refill   Calcium Carbonate-Vitamin D 600-400 MG-UNIT tablet Take 2 tablets by mouth daily.      FLUoxetine (PROZAC) 20 MG capsule TAKE 3 CAPSULES(60 MG) BY MOUTH DAILY     levothyroxine (SYNTHROID) 100 MCG tablet Take 1 tablet (100 mcg total) by mouth daily before breakfast. 90 tablet 3   Multiple Vitamins-Minerals (ICAPS AREDS 2 PO) Take 1 tablet by mouth in the morning and at bedtime.     PREMARIN vaginal cream INSERT 1 APPLICATORFUL VAGINALLY 1 TIME A WEEK 30 g 1   primidone (MYSOLINE) 50 MG tablet TAKE 1 TABLET BY MOUTH AT BEDTIME 90 tablet 3   pravastatin (PRAVACHOL) 10 MG tablet Take 1 tablet (10 mg total) by mouth daily. (Patient not taking: Reported on 05/11/2022) 90 tablet 3   [DISCONTINUED] Fluoxetine HCl, PMDD, 10 MG TABS Take 1 tablet (10 mg total) by mouth daily. Please ask patient to make an appointment for physical exam prior to further refills. 30 tablet 0   No current facility-administered medications on file prior to visit.    Allergies  Allergen Reactions   Atorvastatin     REACTION: muscle pain   Bupropion Hcl     REACTION: decreased  BP   [place] in appetite   Ezetimibe-Simvastatin     REACTION: nausea   Lunesta [Eszopiclone] Other (See Comments)    Lack of effect   Mirtazapine     REACTION: swelling   Nsaids Other (See Comments)    gi   Omeprazole-Sodium Bicarbonate     REACTION: GI upset   Paroxetine      REACTION: felt bad   Raloxifene     REACTION: leg pain   Rosuvastatin     REACTION: increased ALT   Temazepam     nightmares   Trazodone And Nefazodone     nightmares   Zolpidem Tartrate     intolerant    Past Medical History:  Diagnosis Date   Anemia 03/2010   Arthritis    Complication of anesthesia    Depression    GERD (gastroesophageal reflux disease)    Herniated lumbar intervertebral disc 10/2000   Injections   History of hiatal hernia    Hyperlipidemia    Hypothyroidism    PONV (postoperative nausea and vomiting)    Thyroid disease    Hypothyroidism s/p radioactive ablation   Tremor     Past Surgical History:  Procedure Laterality Date   ABDOMINAL HYSTERECTOMY     Partial,prolapse   BLADDER SUSPENSION  03/2005   CATARACT EXTRACTION W/PHACO Right 11/11/2015   Procedure: CATARACT EXTRACTION PHACO AND INTRAOCULAR LENS PLACEMENT ([place]);  Surgeon: Estill Cotta, MD;  Location: [facility] ORS;  Service: Ophthalmology;  Laterality: Right;  [place] 00:55 AP% 23.0 CDE 22.84 fluid pack lot # [id number]   CATARACT EXTRACTION W/PHACO Left 11/01/2015   Procedure: CATARACT EXTRACTION PHACO AND INTRAOCULAR LENS PLACEMENT ([facility]);  Surgeon: Estill Cotta, MD;  Location:  [facility] ORS;  Service: Ophthalmology;  Laterality: Left;  [place]            1:05 AP%        23 CDE       28.79       fluid casette lot # [id number]  exp5/31/2018   CHOLECYSTECTOMY     COLONOSCOPY WITH PROPOFOL N/A 02/13/2022   Procedure: COLONOSCOPY WITH PROPOFOL;  Surgeon: Jonathon Bellows, MD;  Location: [facility] ENDOSCOPY;  Service: Gastroenterology;  Laterality: N/A;   OOPHORECTOMY     bilateral cysts    Family History  Problem Relation Age of Onset   Diabetes Mother    Hypertension Mother    Cancer Mother        breast   Heart disease Mother        MI   Breast cancer Mother    Cancer Sister        Breast   Breast cancer Sister    Heart disease Sister    Healthy Daughter    Hypertension Son    Kidney disease Son     Colon cancer Neg Hx     Social History   Socioeconomic History   Marital status: Widowed    Spouse name: Not on file   Number of children: 2   Years of education: Not on file   Highest education level: Associate degree: academic program  Occupational History   Occupation: Nurse, 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   Widowed 12/2017.   Daughter local.   Social Determinants of Health   Financial Resource Strain: Low Risk  (12/31/2021)   Overall Financial Resource Strain (CARDIA)    Difficulty of Paying Living Expenses: Not hard at all  Food Insecurity: No Food Insecurity (12/31/2021)   Hunger Vital Sign    Worried About Running Out of Food in the Last Year: Never true    Ran Out of Food in the Last Year: Never true  Transportation Needs: No Transportation Needs (12/31/2021)   PRAPARE - Hydrologist (Medical): No    Lack of Transportation (Non-Medical): No  Physical Activity: Insufficiently Active (12/31/2021)   Exercise Vital Sign    Days of Exercise per Week: 2 days    Minutes of Exercise per Session: 20 min  Stress: No Stress Concern Present (12/31/2021)   [place]    Feeling of Stress : Only a little  Social Connections: Socially Isolated (12/31/2021)   Social Connection and Isolation Panel [NHANES]    Frequency of Communication with Friends and Family: Three times a week    Frequency of Social Gatherings with Friends and Family: Once a week     Attends Religious Services: Never    Marine scientist or Organizations: No    Attends Archivist Meetings: Never    Marital Status: Widowed  Intimate Partner Violence: Not At Risk (12/31/2021)  Humiliation, Afraid, Rape, and Kick questionnaire    Fear of Current or Ex-Partner: No    Emotionally Abused: No    Physically Abused: No    Sexually Abused: No     Review of Systems No N/V Appetite is off    Objective:   Physical Exam Constitutional:      Appearance: Normal appearance.  Pulmonary:     Effort: Pulmonary effort is normal. No respiratory distress.  Neurological:     Mental Status: She is alert.            Assessment & Plan:

## 2022-05-13 ENCOUNTER — Encounter: Payer: Self-pay | Admitting: Internal Medicine

## 2022-05-15 ENCOUNTER — Other Ambulatory Visit: Payer: Self-pay | Admitting: Family Medicine

## 2022-05-15 ENCOUNTER — Encounter: Payer: Self-pay | Admitting: Family Medicine

## 2022-06-04 ENCOUNTER — Telehealth: Payer: Self-pay | Admitting: Family Medicine

## 2022-06-04 DIAGNOSIS — H353132 Nonexudative age-related macular degeneration, bilateral, intermediate dry stage: Secondary | ICD-10-CM | POA: Diagnosis not present

## 2022-06-04 DIAGNOSIS — E039 Hypothyroidism, unspecified: Secondary | ICD-10-CM

## 2022-06-04 NOTE — Telephone Encounter (Signed)
Patient would like to have her thyroid levels checked. She's asking for lab orders to be placed.

## 2022-06-04 NOTE — Telephone Encounter (Signed)
Okay to order labs or do you want patient to make an OV?

## 2022-06-05 NOTE — Telephone Encounter (Signed)
Patient was scheduled for lab visit on 06/08/2022 at 3:45 pm.

## 2022-06-05 NOTE — Telephone Encounter (Signed)
Ordered.  Please set up lab visit.  Thanks.

## 2022-06-05 NOTE — Addendum Note (Signed)
Addended by: Tonia Ghent on: 06/05/2022 08:00 AM   Modules accepted: Orders

## 2022-06-08 ENCOUNTER — Other Ambulatory Visit (INDEPENDENT_AMBULATORY_CARE_PROVIDER_SITE_OTHER): Payer: PPO

## 2022-06-08 DIAGNOSIS — E039 Hypothyroidism, unspecified: Secondary | ICD-10-CM

## 2022-06-09 LAB — TSH: TSH: 2.63 u[IU]/mL (ref 0.35–5.50)

## 2022-08-13 ENCOUNTER — Other Ambulatory Visit: Payer: Self-pay | Admitting: Family Medicine

## 2022-09-15 ENCOUNTER — Other Ambulatory Visit: Payer: Self-pay | Admitting: Family Medicine

## 2022-09-15 NOTE — Telephone Encounter (Signed)
Patient is due in may for her medicare wellness with Dr. Damita Dunnings. Please call patient to schedule

## 2022-09-16 NOTE — Telephone Encounter (Signed)
Patient scheduled.

## 2022-11-16 DIAGNOSIS — N958 Other specified menopausal and perimenopausal disorders: Secondary | ICD-10-CM | POA: Diagnosis not present

## 2022-11-16 DIAGNOSIS — M816 Localized osteoporosis [Lequesne]: Secondary | ICD-10-CM | POA: Diagnosis not present

## 2022-11-16 DIAGNOSIS — Z8262 Family history of osteoporosis: Secondary | ICD-10-CM | POA: Diagnosis not present

## 2022-11-16 LAB — HM DEXA SCAN

## 2022-11-25 ENCOUNTER — Other Ambulatory Visit: Payer: Self-pay | Admitting: Family Medicine

## 2022-11-25 NOTE — Telephone Encounter (Signed)
LMTCB

## 2022-11-25 NOTE — Telephone Encounter (Signed)
Patient returned call and is taking 2 capsules daily

## 2022-11-25 NOTE — Telephone Encounter (Signed)
,  Sent. Thanks.   

## 2022-11-25 NOTE — Telephone Encounter (Signed)
Please verify current dose with patient.  Please let me know.  Thanks.

## 2022-11-25 NOTE — Telephone Encounter (Signed)
Refill request Fluoxetine Refill request does not match the med list which shows 3 capsules daily Last refill 08/17/22 Last office visit 05/11/22 acute Upcoming appointment 01/18/23 See allergy/contraindication

## 2022-12-08 ENCOUNTER — Telehealth: Payer: Self-pay | Admitting: Family Medicine

## 2022-12-08 NOTE — Telephone Encounter (Signed)
Contacted Corey Skains to schedule their annual wellness visit. Appointment made for 01/14/2023.  [facility] Care Guide [facility] AWV TEAM Direct Dial: [phone]

## 2022-12-22 DIAGNOSIS — Z130 Encounter for screening for diseases of the blood and blood-forming organs and certain disorders involving the immune mechanism: Secondary | ICD-10-CM | POA: Diagnosis not present

## 2022-12-22 DIAGNOSIS — Z124 Encounter for screening for malignant neoplasm of cervix: Secondary | ICD-10-CM | POA: Diagnosis not present

## 2022-12-22 DIAGNOSIS — Z13228 Encounter for screening for other metabolic disorders: Secondary | ICD-10-CM | POA: Diagnosis not present

## 2022-12-22 DIAGNOSIS — N952 Postmenopausal atrophic vaginitis: Secondary | ICD-10-CM | POA: Diagnosis not present

## 2022-12-22 DIAGNOSIS — Z6825 Body mass index (BMI) 25.0-25.9, adult: Secondary | ICD-10-CM | POA: Diagnosis not present

## 2022-12-22 DIAGNOSIS — Z1321 Encounter for screening for nutritional disorder: Secondary | ICD-10-CM | POA: Diagnosis not present

## 2022-12-22 DIAGNOSIS — Z1329 Encounter for screening for other suspected endocrine disorder: Secondary | ICD-10-CM | POA: Diagnosis not present

## 2022-12-22 DIAGNOSIS — Z1231 Encounter for screening mammogram for malignant neoplasm of breast: Secondary | ICD-10-CM | POA: Diagnosis not present

## 2022-12-22 DIAGNOSIS — Z131 Encounter for screening for diabetes mellitus: Secondary | ICD-10-CM | POA: Diagnosis not present

## 2022-12-22 DIAGNOSIS — Z13220 Encounter for screening for lipoid disorders: Secondary | ICD-10-CM | POA: Diagnosis not present

## 2022-12-22 DIAGNOSIS — Z01419 Encounter for gynecological examination (general) (routine) without abnormal findings: Secondary | ICD-10-CM | POA: Diagnosis not present

## 2022-12-22 LAB — HEMOGLOBIN A1C: Hemoglobin A1C: 5.2

## 2022-12-22 LAB — HM MAMMOGRAPHY

## 2022-12-22 LAB — LIPID PANEL
Cholesterol: 232 — AB (ref 0–200)
HDL: 53 (ref 35–70)
LDL Cholesterol: 158
Triglycerides: 120 (ref 40–160)

## 2022-12-22 LAB — TSH: TSH: 1.48 (ref 0.41–5.90)

## 2022-12-22 LAB — VITAMIN D 25 HYDROXY (VIT D DEFICIENCY, FRACTURES): Vit D, 25-Hydroxy: 30.8

## 2022-12-22 LAB — BASIC METABOLIC PANEL
Creatinine: 0.7 (ref 0.5–1.1)
Glucose: 72

## 2022-12-22 LAB — HEPATIC FUNCTION PANEL
ALT: 23 U/L (ref 7–35)
AST: 23 (ref 13–35)

## 2022-12-23 LAB — LAB REPORT - SCANNED
A1c: 5.2
EGFR: 90

## 2023-01-12 ENCOUNTER — Other Ambulatory Visit: Payer: PPO

## 2023-01-14 ENCOUNTER — Ambulatory Visit (INDEPENDENT_AMBULATORY_CARE_PROVIDER_SITE_OTHER): Payer: PPO

## 2023-01-14 VITALS — Ht 63.0 in | Wt 146.0 lb

## 2023-01-14 DIAGNOSIS — Z1231 Encounter for screening mammogram for malignant neoplasm of breast: Secondary | ICD-10-CM

## 2023-01-14 DIAGNOSIS — Z0000 Encounter for general adult medical examination without abnormal findings: Secondary | ICD-10-CM

## 2023-01-14 NOTE — Patient Instructions (Signed)
[name] , Thank you for taking time to come for your Medicare Wellness Visit. I appreciate your ongoing commitment to your health goals. Please review the following plan we discussed and let me know if I can assist you in the future.   These are the goals we discussed:  Goals      DIET - EAT MORE FRUITS AND VEGETABLES     Patient Stated     Stay healthy        This is a list of the screening recommended for you and due dates:  Health Maintenance  Topic Date Due   Zoster (Shingles) Vaccine (1 of 2) Never done   DEXA scan (bone density measurement)  Never done   COVID-19 Vaccine (4 - 2023-24 season) 04/17/2022   Mammogram  08/19/2022   DTaP/Tdap/Td vaccine (2 - Td or Tdap) 12/24/2022   Flu Shot  03/18/2023   Medicare Annual Wellness Visit  01/14/2024   Colon Cancer Screening  02/14/2032   Pneumonia Vaccine  Completed   Hepatitis C Screening  Completed   HPV Vaccine  Aged Out    Advanced directives: Please bring a copy of your health care power of attorney and living will to the office to be added to your chart at your convenience.   Conditions/risks identified: Aim for 30 minutes of exercise or brisk walking, 6-8 glasses of water, and 5 servings of fruits and vegetables each day.   Next appointment: Follow up in one year for your annual wellness visit 01/18/24 @ 9:45 televisit   Preventive Care 65 Years and Older, Female Preventive care refers to lifestyle choices and visits with your health care provider that can promote health and wellness. What does preventive care include? A yearly physical exam. This is also called an annual well check. Dental exams once or twice a year. Routine eye exams. Ask your health care provider how often you should have your eyes checked. Personal lifestyle choices, including: Daily care of your teeth and gums. Regular physical activity. Eating a healthy diet. Avoiding tobacco and drug use. Limiting alcohol use. Practicing safe  sex. Taking low-dose aspirin every day. Taking vitamin and mineral supplements as recommended by your health care provider. What happens during an annual well check? The services and screenings done by your health care provider during your annual well check will depend on your age, overall health, lifestyle risk factors, and family history of disease. Counseling  Your health care provider may ask you questions about your: Alcohol use. Tobacco use. Drug use. Emotional well-being. Home and relationship well-being. Sexual activity. Eating habits. History of falls. Memory and ability to understand (cognition). Work and work Astronomer. Reproductive health. Screening  You may have the following tests or measurements: Height, weight, and BMI. Blood pressure. Lipid and cholesterol levels. These may be checked every 5 years, or more frequently if you are over 28 years old. Skin check. Lung cancer screening. You may have this screening every year starting at age 69 if you have a 30-pack-year history of smoking and currently smoke or have quit within the past 15 years. Fecal occult blood test (FOBT) of the stool. You may have this test every year starting at age 94. Flexible sigmoidoscopy or colonoscopy. You may have a sigmoidoscopy every 5 years or a colonoscopy every 10 years starting at age 59. Hepatitis C blood test. Hepatitis B blood test. Sexually transmitted disease (STD) testing. Diabetes screening. This is done by checking your blood sugar (glucose) after you have not  eaten for a while (fasting). You may have this done every 1-3 years. Bone density scan. This is done to screen for osteoporosis. You may have this done starting at age 19. Mammogram. This may be done every 1-2 years. Talk to your health care provider about how often you should have regular mammograms. Talk with your health care provider about your test results, treatment options, and if necessary, the need for more  tests. Vaccines  Your health care provider may recommend certain vaccines, such as: Influenza vaccine. This is recommended every year. Tetanus, diphtheria, and acellular pertussis (Tdap, Td) vaccine. You may need a Td booster every 10 years. Zoster vaccine. You may need this after age 30. Pneumococcal 13-valent conjugate (PCV13) vaccine. One dose is recommended after age 29. Pneumococcal polysaccharide (PPSV23) vaccine. One dose is recommended after age 52. Talk to your health care provider about which screenings and vaccines you need and how often you need them. This information is not intended to replace advice given to you by your health care provider. Make sure you discuss any questions you have with your health care provider. Document Released: 08/30/2015 Document Revised: 04/22/2016 Document Reviewed: 06/04/2015 Elsevier Interactive Patient Education  2017 [employer].  Fall Prevention in the Home Falls can cause injuries. They can happen to people of all ages. There are many things you can do to make your home safe and to help prevent falls. What can I do on the outside of my home? Regularly fix the edges of walkways and driveways and fix any cracks. Remove anything that might make you trip as you walk through a door, such as a raised step or threshold. Trim any bushes or trees on the path to your home. Use bright outdoor lighting. Clear any walking paths of anything that might make someone trip, such as rocks or tools. Regularly check to see if handrails are loose or broken. Make sure that both sides of any steps have handrails. Any raised decks and porches should have guardrails on the edges. Have any leaves, snow, or ice cleared regularly. Use sand or salt on walking paths during winter. Clean up any spills in your garage right away. This includes oil or grease spills. What can I do in the bathroom? Use night lights. Install grab bars by the toilet and in the tub and shower.  Do not use towel bars as grab bars. Use non-skid mats or decals in the tub or shower. If you need to sit down in the shower, use a plastic, non-slip stool. Keep the floor dry. Clean up any water that spills on the floor as soon as it happens. Remove soap buildup in the tub or shower regularly. Attach bath mats securely with double-sided non-slip rug tape. Do not have throw rugs and other things on the floor that can make you trip. What can I do in the bedroom? Use night lights. Make sure that you have a light by your bed that is easy to reach. Do not use any sheets or blankets that are too big for your bed. They should not hang down onto the floor. Have a firm chair that has side arms. You can use this for support while you get dressed. Do not have throw rugs and other things on the floor that can make you trip. What can I do in the kitchen? Clean up any spills right away. Avoid walking on wet floors. Keep items that you use a lot in easy-to-reach places. If you need to reach  something above you, use a strong step stool that has a grab bar. Keep electrical cords out of the way. Do not use floor polish or wax that makes floors slippery. If you must use wax, use non-skid floor wax. Do not have throw rugs and other things on the floor that can make you trip. What can I do with my stairs? Do not leave any items on the stairs. Make sure that there are handrails on both sides of the stairs and use them. Fix handrails that are broken or loose. Make sure that handrails are as long as the stairways. Check any carpeting to make sure that it is firmly attached to the stairs. Fix any carpet that is loose or worn. Avoid having throw rugs at the top or bottom of the stairs. If you do have throw rugs, attach them to the floor with carpet tape. Make sure that you have a light switch at the top of the stairs and the bottom of the stairs. If you do not have them, ask someone to add them for you. What else  can I do to help prevent falls? Wear shoes that: Do not have high heels. Have rubber bottoms. Are comfortable and fit you well. Are closed at the toe. Do not wear sandals. If you use a stepladder: Make sure that it is fully opened. Do not climb a closed stepladder. Make sure that both sides of the stepladder are locked into place. Ask someone to hold it for you, if possible. Clearly mark and make sure that you can see: Any grab bars or handrails. First and last steps. Where the edge of each step is. Use tools that help you move around (mobility aids) if they are needed. These include: Canes. Walkers. Scooters. Crutches. Turn on the lights when you go into a dark area. Replace any light bulbs as soon as they burn out. Set up your furniture so you have a clear path. Avoid moving your furniture around. If any of your floors are uneven, fix them. If there are any pets around you, be aware of where they are. Review your medicines with your doctor. Some medicines can make you feel dizzy. This can increase your chance of falling. Ask your doctor what other things that you can do to help prevent falls. This information is not intended to replace advice given to you by your health care provider. Make sure you discuss any questions you have with your health care provider. Document Released: 05/30/2009 Document Revised: 01/09/2016 Document Reviewed: 09/07/2014 Elsevier Interactive Patient Education  2017 [employer].

## 2023-01-14 NOTE — Progress Notes (Signed)
I connected with  Corey Skains on 01/14/23 by a audio enabled telemedicine application and verified that I am speaking with the correct person using two identifiers.  Patient Location: Home  Provider Location: Home Office  I discussed the limitations of evaluation and management by telemedicine. The patient expressed understanding and agreed to proceed.  Subjective:   JENELLE MALEN is a 67 y.o. female who presents for Medicare Annual (Subsequent) preventive examination.  Review of Systems      Cardiac Risk Factors include: advanced age (>82men, >65 women)     Objective:    Today's Vitals   01/14/23 1303  Weight: 146 lb (66.2 kg)  Height: 5\' 3"  (1.6 m)   Body mass index is 25.86 kg/m.     01/14/2023    1:10 PM 02/13/2022    9:45 AM 12/31/2021    1:08 PM 02/27/2020    6:10 PM 11/15/2019    9:20 AM 03/15/2019   10:52 AM 02/20/2019    9:47 AM  Advanced Directives  Does Patient Have a Medical Advance Directive? Yes Yes No Yes Yes Yes Yes  Type of Estate agent of [place];Living will Living will  Healthcare Power of [place];Living will Healthcare Power of [place];Living will Healthcare Power of [place];Living will Living will;Healthcare Power of Attorney  Copy of Healthcare Power of Attorney in Chart? No - copy requested     No - copy requested   Would patient like information on creating a medical advance directive?   No - Patient declined No - Patient declined       Current Medications (verified) Outpatient Encounter Medications as of 01/14/2023  Medication Sig   Calcium Carbonate-Vitamin D 600-400 MG-UNIT tablet Take 2 tablets by mouth daily.    FLUoxetine (PROZAC) 20 MG capsule TAKE 2 CAPSULES BY MOUTH DAILY.   levothyroxine (SYNTHROID) 100 MCG tablet TAKE 1 TABLET EVERY DAY ON EMPTY STOMACHWITH A GLASS OF WATER AT LEAST 30-60 MINBEFORE BREAKFAST   Multiple Vitamins-Minerals (ICAPS AREDS 2 PO) Take 1 tablet by mouth in the morning and at bedtime.    pravastatin (PRAVACHOL) 10 MG tablet Take 1 tablet (10 mg total) by mouth daily.   PREMARIN vaginal cream INSERT 1 APPLICATORFUL VAGINALLY 1 TIME A WEEK   primidone (MYSOLINE) 50 MG tablet TAKE 1 TABLET BY MOUTH AT BEDTIME   HYDROcodone bit-homatropine (HYCODAN) 5-1.5 MG/5ML syrup Take 5 mLs by mouth at bedtime as needed for cough.   [DISCONTINUED] Fluoxetine HCl, PMDD, 10 MG TABS Take 1 tablet (10 mg total) by mouth daily. Please ask patient to make an appointment for physical exam prior to further refills.   No facility-administered encounter medications on file as of 01/14/2023.    Allergies (verified) Atorvastatin, Bupropion hcl, Buspar [buspirone], Ezetimibe-simvastatin, Lunesta [eszopiclone], Mirtazapine, Molnupiravir, Nsaids, Omeprazole-sodium bicarbonate, Paroxetine, Raloxifene, Rosuvastatin, Temazepam, Trazodone and nefazodone, and Zolpidem tartrate   History: Past Medical History:  Diagnosis Date   Anemia 03/2010   Arthritis    Complication of anesthesia    Depression    GERD (gastroesophageal reflux disease)    Herniated lumbar intervertebral disc 10/2000   Injections   History of hiatal hernia    Hyperlipidemia    Hypothyroidism    PONV (postoperative nausea and vomiting)    Thyroid disease    Hypothyroidism s/p radioactive ablation   Tremor    Past Surgical History:  Procedure Laterality Date   ABDOMINAL HYSTERECTOMY     Partial,prolapse   BLADDER SUSPENSION  03/2005   CATARACT EXTRACTION  W/PHACO Right 11/11/2015   Procedure: CATARACT EXTRACTION PHACO AND INTRAOCULAR LENS PLACEMENT (IOC);  Surgeon: Sallee Lange, MD;  Location: [facility] ORS;  Service: Ophthalmology;  Laterality: Right;  [place] 00:55 AP% 23.0 CDE 22.84 fluid pack lot # [id number]   CATARACT EXTRACTION W/PHACO Left 11/01/2015   Procedure: CATARACT EXTRACTION PHACO AND INTRAOCULAR LENS PLACEMENT (IOC);  Surgeon: Sallee Lange, MD;  Location: [facility] ORS;  Service: Ophthalmology;  Laterality: Left;  [place]             1:05 AP%        23 CDE       28.79       fluid casette lot # [id number]  exp5/31/2018   CHOLECYSTECTOMY     COLONOSCOPY WITH PROPOFOL N/A 02/13/2022   Procedure: COLONOSCOPY WITH PROPOFOL;  Surgeon: Wyline Mood, MD;  Location: [facility] ENDOSCOPY;  Service: Gastroenterology;  Laterality: N/A;   OOPHORECTOMY     bilateral cysts   Family History  Problem Relation Age of Onset   Diabetes Mother    Hypertension Mother    Cancer Mother        breast   Heart disease Mother        MI   Breast cancer Mother    Cancer Sister        Breast   Breast cancer Sister    Heart disease Sister    Healthy Daughter    Hypertension Son    Kidney disease Son    Colon cancer Neg Hx    Social History   Socioeconomic History   Marital status: Widowed    Spouse name: Not on file   Number of children: 2   Years of education: Not on file   Highest education level: Associate degree: academic program  Occupational History   Occupation: Research officer, political party at [employer]  Tobacco Use   Smoking status: Former    Types: Cigarettes    Quit date: 08/17/2004    Years since quitting: 18.4    Passive exposure: Never   Smokeless tobacco: Never  Vaping Use   Vaping Use: Never used  Substance and Sexual Activity   Alcohol use: Not Currently   Drug use: No   Sexual activity: Not on file  Other Topics Concern   Not on file  Social History Narrative   From [place].   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   Widowed 12/2017.   Daughter local.   Social Determinants of Health   Financial Resource Strain: Low Risk  (01/14/2023)   Overall Financial Resource Strain (CARDIA)    Difficulty of Paying Living Expenses: Not hard at all  Food Insecurity: No Food Insecurity (01/14/2023)   Hunger Vital Sign    Worried About Running Out of Food in the Last Year: Never true    Ran Out of Food in the Last Year: Never true  Transportation Needs: No Transportation Needs (01/14/2023)   PRAPARE -  Administrator, Civil Service (Medical): No    Lack of Transportation (Non-Medical): No  Physical Activity: Sufficiently Active (01/14/2023)   Exercise Vital Sign    Days of Exercise per Week: 5 days    Minutes of Exercise per Session: 40 min  Stress: No Stress Concern Present (01/14/2023)   [employer] of Occupational Health - Occupational Stress Questionnaire    Feeling of Stress : Not at all  Social Connections: Moderately Integrated (01/14/2023)   Social Connection and Isolation Panel [NHANES]    Frequency of  Communication with Friends and Family: More than three times a week    Frequency of Social Gatherings with Friends and Family: More than three times a week    Attends Religious Services: More than 4 times per year    Active Member of [employer] or Organizations: Yes    Attends Banker Meetings: More than 4 times per year    Marital Status: Widowed    Tobacco Counseling Counseling given: Not Answered   Clinical Intake:  Pre-visit preparation completed: Yes  Pain : No/denies pain     Nutritional Risks: None Diabetes: No  How often do you need to have someone help you when you read instructions, pamphlets, or other written materials from your doctor or pharmacy?: 1 - Never  Diabetic? no  Interpreter Needed?: No  Information entered by :: C.Siler Mavis LPN   Activities of Daily Living    01/14/2023    1:11 PM 01/10/2023   12:18 PM  In your present state of health, do you have any difficulty performing the following activities:  Hearing? 0 0  Vision? 0 1  Difficulty concentrating or making decisions? 0 0  Walking or climbing stairs? 0 0  Dressing or bathing? 0 0  Doing errands, shopping? 0 0  Preparing Food and eating ? N N  Using the Toilet? N N  In the past six months, have you accidently leaked urine? N N  Do you have problems with loss of bowel control? N N  Managing your Medications? N N  Managing your Finances? N N  Housekeeping  or managing your Housekeeping? N N    Patient Care Team: Joaquim Nam, MD as PCP - General  Indicate any recent Medical Services you may have received from other than [facility] providers in the past year (date may be approximate).     Assessment:   This is a routine wellness examination for [place].  Hearing/Vision screen Hearing Screening - Comments:: None Vision Screening - Comments:: Glasses -  Eye  Dietary issues and exercise activities discussed: Current Exercise Habits: Home exercise routine, Type of exercise: stretching;walking, Time (Minutes): 40, Frequency (Times/Week): 5, Weekly Exercise (Minutes/Week): 200, Intensity: Mild, Exercise limited by: None identified   Goals Addressed             This Visit's Progress    Patient Stated       Stay healthy       Depression Screen    01/14/2023    1:09 PM 01/13/2022    2:42 PM 12/31/2021    1:07 PM 03/10/2018   11:50 AM 10/19/2016    8:21 AM  PHQ 2/9 Scores  PHQ - 2 Score 0 5 0 6 0  PHQ- 9 Score  20       Fall Risk    01/14/2023    1:11 PM 01/10/2023   12:18 PM 12/31/2021    1:09 PM 12/31/2021   11:59 AM 03/24/2021    1:22 PM  Fall Risk   Falls in the past year? 0 0 1 1 0  Number falls in past yr: 0 0 0 0   Injury with Fall? 0 0 0 0   Risk for fall due to : No Fall Risks  History of fall(s)    Follow up Falls prevention discussed;Falls evaluation completed  Falls evaluation completed;Falls prevention discussed      FALL RISK PREVENTION PERTAINING TO THE HOME:  Any stairs in or around the home? Yes  If so, are there any  without handrails? No  Home free of loose throw rugs in walkways, pet beds, electrical cords, etc? Yes  Adequate lighting in your home to reduce risk of falls? Yes   ASSISTIVE DEVICES UTILIZED TO PREVENT FALLS:  Life alert? No  Use of a cane, walker or w/c? No  Grab bars in the bathroom? Yes  Shower chair or bench in shower? Yes  Elevated toilet seat or a handicapped toilet? Yes     Cognitive Function:        01/14/2023    1:12 PM  6CIT Screen  What Year? 0 points  What month? 0 points  What time? 0 points  Count back from 20 0 points  Months in reverse 0 points  Repeat phrase 0 points  Total Score 0 points    Immunizations Immunization History  Administered Date(s) Administered   Influenza,inj,Quad PF,6+ Mos 06/01/2019   Influenza-Unspecified 06/05/2014, 05/18/2015, 06/18/2020   PFIZER(Purple Top)SARS-COV-2 Vaccination 08/21/2019, 09/11/2019, 08/13/2020   PNEUMOCOCCAL CONJUGATE-20 01/13/2022   Pneumococcal Polysaccharide-23 10/31/2020   Tdap 12/23/2012   Zoster, Live 08/18/2015    TDAP status: Due, Education has been provided regarding the importance of this vaccine. Advised may receive this vaccine at local pharmacy or Health Dept. Aware to provide a copy of the vaccination record if obtained from local pharmacy or Health Dept. Verbalized acceptance and understanding.  Flu Vaccine status: Up to date  Pneumococcal vaccine status: Up to date  Covid-19 vaccine status: Information provided on how to obtain vaccines.   Qualifies for Shingles Vaccine? Yes   Zostavax completed No   Shingrix Completed?: No.    Education has been provided regarding the importance of this vaccine. Patient has been advised to call insurance company to determine out of pocket expense if they have not yet received this vaccine. Advised may also receive vaccine at local pharmacy or Health Dept. Verbalized acceptance and understanding.  Screening Tests Health Maintenance  Topic Date Due   Zoster Vaccines- Shingrix (1 of 2) Never done   DEXA SCAN  Never done   COVID-19 Vaccine (4 - 2023-24 season) 04/17/2022   MAMMOGRAM  08/19/2022   DTaP/Tdap/Td (2 - Td or Tdap) 12/24/2022   INFLUENZA VACCINE  03/18/2023   Medicare Annual Wellness (AWV)  01/14/2024   Colonoscopy  02/14/2032   Pneumonia Vaccine 74+ Years old  Completed   Hepatitis C Screening  Completed   HPV VACCINES   Aged Out    Health Maintenance  Health Maintenance Due  Topic Date Due   Zoster Vaccines- Shingrix (1 of 2) Never done   DEXA SCAN  Never done   COVID-19 Vaccine (4 - 2023-24 season) 04/17/2022   MAMMOGRAM  08/19/2022   DTaP/Tdap/Td (2 - Td or Tdap) 12/24/2022    Colorectal cancer screening: Type of screening: Colonoscopy. Completed 02/13/22. Repeat every 10 years  Mammogram status: Completed 11/13/2021. Repeat every year  Bone Density status: Completed 01/10/23. Results reflect: Bone density results: OSTEOPOROSIS. Repeat every 2 years.  Lung Cancer Screening: (Low Dose CT Chest recommended if Age 37-80 years, 30 pack-year currently smoking OR have quit w/in 15years.) does not qualify.   Lung Cancer Screening Referral: no  Additional Screening:  Hepatitis C Screening: does qualify; Completed 07/22/15  Vision Screening: Recommended annual ophthalmology exams for early detection of glaucoma and other disorders of the eye. Is the patient up to date with their annual eye exam?  Yes  Who is the provider or what is the name of the office in which the patient  attends annual eye exams? [facility] If pt is not established with a provider, would they like to be referred to a provider to establish care? Yes .   Dental Screening: Recommended annual dental exams for proper oral hygiene  Community Resource Referral / Chronic Care Management: CRR required this visit?  No   CCM required this visit?  No      Plan:     I have personally reviewed and noted the following in the patient's chart:   Medical and social history Use of alcohol, tobacco or illicit drugs  Current medications and supplements including opioid prescriptions. Patient is not currently taking opioid prescriptions. Functional ability and status Nutritional status Physical activity Advanced directives List of other physicians Hospitalizations, surgeries, and ER visits in previous 12  months Vitals Screenings to include cognitive, depression, and falls Referrals and appointments  In addition, I have reviewed and discussed with patient certain preventive protocols, quality metrics, and best practice recommendations. A written personalized care plan for preventive services as well as general preventive health recommendations were provided to patient.     Maryan Puls, LPN   1/61/0960   Nurse Notes: no concerns. Order for mammogram placed.

## 2023-01-18 ENCOUNTER — Ambulatory Visit (INDEPENDENT_AMBULATORY_CARE_PROVIDER_SITE_OTHER): Payer: PPO | Admitting: Family Medicine

## 2023-01-18 ENCOUNTER — Encounter: Payer: Self-pay | Admitting: Family Medicine

## 2023-01-18 VITALS — BP 118/60 | HR 91 | Temp 97.7°F | Ht 63.0 in | Wt 143.0 lb

## 2023-01-18 DIAGNOSIS — Z0000 Encounter for general adult medical examination without abnormal findings: Secondary | ICD-10-CM

## 2023-01-18 DIAGNOSIS — F32A Depression, unspecified: Secondary | ICD-10-CM | POA: Diagnosis not present

## 2023-01-18 DIAGNOSIS — E039 Hypothyroidism, unspecified: Secondary | ICD-10-CM | POA: Diagnosis not present

## 2023-01-18 DIAGNOSIS — R251 Tremor, unspecified: Secondary | ICD-10-CM

## 2023-01-18 DIAGNOSIS — Z7189 Other specified counseling: Secondary | ICD-10-CM

## 2023-01-18 DIAGNOSIS — E785 Hyperlipidemia, unspecified: Secondary | ICD-10-CM

## 2023-01-18 DIAGNOSIS — R0683 Snoring: Secondary | ICD-10-CM

## 2023-01-18 NOTE — Progress Notes (Unsigned)
Hypothyroidism.  No neck mass.  TSH wnl.  Fatigue noted.  Labs d/w pt. Frequently hungry, craving carbs.  Trouble getting to sleep, with reviewing her list of items to get done, vs recall of recent vs distant events. She is working on Physiological scientist.  Mood d/w pt. Still on prozac 40mg  a day.   Motivation is low.  No SI/HI.  Snoring noted.    Elevated Cholesterol: Using medications without problems: yes Muscle aches: no Diet compliance: d/w pt.   Exercise: d/w pt.  Labs d/w pt.    Flu prev done.   Shingles discussed with patient PNA 2023 Tetanus 2014, d/w pt.   Covid vaccine up-to-date Colonoscopy 2023 Breast cancer screening per gynecology Bone density test per gynecology Advance directive- [place] Noe Sweger would be designated if patient were incapacitated.    Still on primidone for tremor.  No ADE on med.  Inc in tremor if fasting.  Sx improved with primidone.  No tremor at rest.  Noted in B hand with extension.  Meds, vitals, and allergies reviewed.   ROS: Per HPI unless specifically indicated in ROS section   GEN: nad, alert and oriented HEENT: mucous membranes moist NECK: supple w/o LA CV: rrr.  no murmur PULM: ctab, no inc wob ABD: soft, +bs EXT: no edema SKIN: no acute rash

## 2023-01-18 NOTE — Patient Instructions (Addendum)
Tetanus may be cheaper at the pharmacy.   Please let me know about the estrogen cream you are using.   Let me know if you don't get a call about seeing pulmonary.  Take care.  Glad to see you.

## 2023-01-21 DIAGNOSIS — R0683 Snoring: Secondary | ICD-10-CM | POA: Insufficient documentation

## 2023-01-21 NOTE — Assessment & Plan Note (Signed)
Continue pravastatin.  Continue work on diet and exercise. 

## 2023-01-21 NOTE — Assessment & Plan Note (Signed)
Would continue primidone as is.

## 2023-01-21 NOTE — Assessment & Plan Note (Signed)
TSH normal.  Continue levothyroxine as is. 

## 2023-01-21 NOTE — Assessment & Plan Note (Signed)
Would still continue Prozac as is for now.

## 2023-01-21 NOTE — Assessment & Plan Note (Signed)
Advance directive- Vanessa Sweger would be designated if patient were incapacitated.  

## 2023-01-21 NOTE — Assessment & Plan Note (Signed)
Unclear if she has sleep apnea and if it contributes to the symptoms above.  Reasonable to refer to pulmonary for consideration of sleep testing.

## 2023-01-21 NOTE — Assessment & Plan Note (Signed)
Flu prev done.   Shingles discussed with patient PNA 2023 Tetanus 2014, d/w pt.   Covid vaccine up-to-date Colonoscopy 2023 Breast cancer screening per gynecology Bone density test per gynecology Advance directive- Abigail Marks would be designated if patient were incapacitated.

## 2023-01-25 ENCOUNTER — Ambulatory Visit (INDEPENDENT_AMBULATORY_CARE_PROVIDER_SITE_OTHER): Payer: PPO | Admitting: Adult Health

## 2023-01-25 ENCOUNTER — Encounter: Payer: Self-pay | Admitting: Adult Health

## 2023-01-25 VITALS — BP 112/70 | HR 66 | Ht 63.0 in | Wt 145.0 lb

## 2023-01-25 DIAGNOSIS — G4709 Other insomnia: Secondary | ICD-10-CM | POA: Diagnosis not present

## 2023-01-25 DIAGNOSIS — R0683 Snoring: Secondary | ICD-10-CM

## 2023-01-25 NOTE — Assessment & Plan Note (Signed)
Snoring , restless sleep and daytime sleepiness suspicious for sleep apnea . Patient education given  - discussed how weight can impact sleep and risk for sleep disordered breathing - discussed options to assist with weight loss: combination of diet modification, cardiovascular and strength training exercises   - had an extensive discussion regarding the adverse health consequences related to untreated sleep disordered breathing - specifically discussed the risks for hypertension, coronary artery disease, cardiac dysrhythmias, cerebrovascular disease, and diabetes - lifestyle modification discussed   - discussed how sleep disruption can increase risk of accidents, particularly when driving - safe driving practices were discussed   Plan  Patient Instructions  Set up for home sleep study Healthy sleep regimen Do not drive if sleepy Follow-up in 6 weeks to discuss sleep study results and treatment plan

## 2023-01-25 NOTE — Progress Notes (Signed)
Reviewed and agree with assessment/plan.   Coralyn Helling, MD [facility] Pulmonary/Critical Care 01/25/2023, 1:37 PM Pager:  [phone]

## 2023-01-25 NOTE — Patient Instructions (Signed)
Set up for home sleep study  Healthy sleep regimen  Do not drive if sleepy  Follow up in 6 weeks to discuss sleep study results and treatment plan   

## 2023-01-25 NOTE — Progress Notes (Signed)
@Patient  ID: Abigail Marks, female    DOB: [birth year], 67 y.o.   MRN: [id number]  Chief Complaint  Patient presents with   Consult    Referring provider: Joaquim Nam, MD  HPI: 67 year old female seen for sleep consult January 25, 2023 for snoring, restless sleep and daytime sleepiness  TEST/EVENTS :   01/25/2023 Sleep consult  Patient presents for sleep consult today.  Kindly referred by primary care provider Dr. Para March.  Patient complains of snoring, restless sleep, daytime sleepiness. Says it is hard for her to go to sleep and stay asleep. Has tried multiple sleep aides in past including Ambien, Restoril, trazodone, Lunesta, melatonin without any significant help and had significant side effects.  Patient has associated snoring and daytime sleepiness .  Feels tired all the time has no energy.  Feels like she walks around in a fog.  Does have headaches and teeth grinding and teeth clenching.  Does not nap.  No history of congestive heart failure or stroke.  Currently does not take any sleep aids.  Has had no previous sleep study.  Weight is up about 8 pounds over the last 2 years.  Current weight is at 145 pounds with a BMI 25.  Patient goes to sleep between 10 PM and midnight.  Can take hours to go to sleep.  Is up 3-4 times at night.  Up at 8 AM.  She does have removable dental work.  Epworth score is 2.      01/25/2023   11:00 AM  Results of the Epworth flowsheet  Sitting and reading 0  Watching TV 0  Sitting, inactive in a public place (e.g. a theatre or a meeting) 0  As a passenger in a car for an hour without a break 0  Lying down to rest in the afternoon when circumstances permit 2  Sitting and talking to someone 0  Sitting quietly after a lunch without alcohol 0  In a car, while stopped for a few minutes in traffic 0  Total score 2   Social history.  Patient is widowed.  Her daughter lives with her.  She is retired Engineer, civil (consulting).  Has 2 adult children.  She quit smoking over  20 years ago.  Social alcohol.  No drug use.  Family history positive for heart disease, coagulation disorders, cancer.  Past Surgical History:  Procedure Laterality Date   ABDOMINAL HYSTERECTOMY     Partial,prolapse   BLADDER SUSPENSION  03/17/2005   CATARACT EXTRACTION W/PHACO Right 11/11/2015   Procedure: CATARACT EXTRACTION PHACO AND INTRAOCULAR LENS PLACEMENT (IOC);  Surgeon: Sallee Lange, MD;  Location: [facility] ORS;  Service: Ophthalmology;  Laterality: Right;  [place] 00:55 AP% 23.0 CDE 22.84 fluid pack lot # [id number]   CATARACT EXTRACTION W/PHACO Left 11/01/2015   Procedure: CATARACT EXTRACTION PHACO AND INTRAOCULAR LENS PLACEMENT (IOC);  Surgeon: Sallee Lange, MD;  Location: [facility];  Service: Ophthalmology;  Laterality: Left;  [place]            1:05 AP%        23 CDE       28.79       fluid casette lot # [id number]  exp5/31/2018   CHOLECYSTECTOMY     COLONOSCOPY WITH PROPOFOL N/A 02/13/2022   Procedure: COLONOSCOPY WITH PROPOFOL;  Surgeon: Wyline Mood, MD;  Location: [facility];  Service: Gastroenterology;  Laterality: N/A;   LIPOMA EXCISION     OOPHORECTOMY     bilateral cysts     Allergies  Allergen Reactions   Atorvastatin     REACTION: muscle pain   Bupropion Hcl     REACTION: decreased  BP   Buspar [Buspirone]     Inc in appetite   Ezetimibe-Simvastatin     REACTION: nausea   Lunesta [Eszopiclone] Other (See Comments)    Lack of effect   Mirtazapine     REACTION: swelling   Molnupiravir     GI upset.    Nsaids Other (See Comments)    gi   Omeprazole-Sodium Bicarbonate     REACTION: GI upset   Paroxetine     REACTION: felt bad   Raloxifene     REACTION: leg pain   Rosuvastatin     REACTION: increased ALT   Temazepam     nightmares   Trazodone And Nefazodone     nightmares   Zolpidem Tartrate     intolerant    Immunization History  Administered Date(s) Administered   Influenza,inj,Quad PF,6+ Mos 06/01/2019   Influenza-Unspecified  06/05/2014, 05/18/2015, 06/18/2020   PFIZER(Purple Top)SARS-COV-2 Vaccination 08/21/2019, 09/11/2019, 08/13/2020   PNEUMOCOCCAL CONJUGATE-20 01/13/2022   Pneumococcal Polysaccharide-23 10/31/2020   Tdap 12/23/2012   Zoster, Live 08/18/2015    Past Medical History:  Diagnosis Date   Anemia 03/2010   Arthritis    Complication of anesthesia    Depression    GERD (gastroesophageal reflux disease)    Herniated lumbar intervertebral disc 10/2000   Injections   History of hiatal hernia    Hyperlipidemia    Hypothyroidism    PONV (postoperative nausea and vomiting)    Thyroid disease    Hypothyroidism s/p radioactive ablation   Tremor     Tobacco History: Social History   Tobacco Use  Smoking Status Former   Types: Cigarettes   Quit date: 08/17/2004   Years since quitting: 18.4   Passive exposure: Never  Smokeless Tobacco Never  Tobacco Comments   Social smoker.  May not even smoke a pack per week.  01/25/2023 hfb   Counseling given: Not Answered Tobacco comments: Social smoker.  May not even smoke a pack per week.  01/25/2023 hfb   Outpatient Medications Prior to Visit  Medication Sig Dispense Refill   Calcium Carbonate-Vitamin D 600-400 MG-UNIT tablet Take 2 tablets by mouth daily.      FLUoxetine (PROZAC) 20 MG capsule TAKE 2 CAPSULES BY MOUTH DAILY. 180 capsule 1   levothyroxine (SYNTHROID) 100 MCG tablet TAKE 1 TABLET EVERY DAY ON EMPTY STOMACHWITH A GLASS OF WATER AT LEAST 30-60 MINBEFORE BREAKFAST 90 tablet 3   Multiple Vitamins-Minerals (ICAPS AREDS 2 PO) Take 1 tablet by mouth in the morning and at bedtime.     pravastatin (PRAVACHOL) 10 MG tablet Take 1 tablet (10 mg total) by mouth daily. 90 tablet 3   primidone (MYSOLINE) 50 MG tablet TAKE 1 TABLET BY MOUTH AT BEDTIME 90 tablet 1   No facility-administered medications prior to visit.     Review of Systems:   Constitutional:   No  weight loss, night sweats,  Fevers, chills,  +atigue, or  lassitude.  HEENT:    No headaches,  Difficulty swallowing,  Tooth/dental problems, or  Sore throat,                No sneezing, itching, ear ache, nasal congestion, post nasal drip,   CV:  No chest pain,  Orthopnea, PND, swelling in lower extremities, anasarca, dizziness, palpitations, syncope.   GI  No heartburn, indigestion, abdominal pain, nausea, vomiting, diarrhea,  change in bowel habits, loss of appetite, bloody stools.   Resp: No shortness of breath with exertion or at rest.  No excess mucus, no productive cough,  No non-productive cough,  No coughing up of blood.  No change in color of mucus.  No wheezing.  No chest wall deformity  Skin: no rash or lesions.  GU: no dysuria, change in color of urine, no urgency or frequency.  No flank pain, no hematuria   MS:  No joint pain or swelling.  No decreased range of motion.  No back pain.    Physical Exam  BP 112/70 (BP Location: Left Arm, Patient Position: Sitting, Cuff Size: Normal)   Pulse 66   Ht 5\' 3"  (1.6 m)   Wt 145 lb (65.8 kg)   SpO2 96%   BMI 25.69 kg/m   GEN: A/Ox3; pleasant , NAD, well nourished    HEENT:  [place]/AT,  EACs-clear, TMs-wnl, NOSE-clear, THROAT-clear, no lesions, no postnasal drip or exudate noted. Class 2 MP airway   NECK:  Supple w/ fair ROM; no JVD; normal carotid impulses w/o bruits; no thyromegaly or nodules palpated; no lymphadenopathy.    RESP  Clear  P & A; w/o, wheezes/ rales/ or rhonchi. no accessory muscle use, no dullness to percussion  CARD:  RRR, no m/r/g, no peripheral edema, pulses intact, no cyanosis or clubbing.  GI:   Soft & nt; nml bowel sounds; no organomegaly or masses detected.   Musco: Warm bil, no deformities or joint swelling noted.   Neuro: alert, no focal deficits noted.    Skin: Warm, no lesions or rashes    Lab Results:  CBC    Component Value Date/Time   WBC 6.8 01/13/2022 1455   RBC 3.89 01/13/2022 1455   HGB 12.4 01/13/2022 1455   HCT 37.2 01/13/2022 1455   PLT 270.0 01/13/2022  1455   MCV 95.5 01/13/2022 1455   MCH 32.4 02/27/2020 1845   MCHC 33.4 01/13/2022 1455   RDW 12.1 01/13/2022 1455   LYMPHSABS 2.4 01/13/2022 1455   MONOABS 0.7 01/13/2022 1455   EOSABS 0.1 01/13/2022 1455   BASOSABS 0.1 01/13/2022 1455    BMET    Component Value Date/Time   NA 138 01/13/2022 1455   K 3.9 01/13/2022 1455   K 4.0 04/28/2016 0000   CL 101 01/13/2022 1455   CO2 31 01/13/2022 1455   GLUCOSE 95 01/13/2022 1455   BUN 17 01/13/2022 1455   CREATININE 0.7 12/22/2022 0000   CREATININE 0.73 01/13/2022 1455   CREATININE 0.72 04/28/2016 0000   CREATININE 0.76 03/07/2014 0000   CALCIUM 9.7 01/13/2022 1455   GFRNONAA >60 02/27/2020 1845   GFRAA >60 02/27/2020 1845    BNP No results found for: "BNP"  ProBNP No results found for: "PROBNP"  Imaging: No results found.        No data to display          No results found for: "NITRICOXIDE"      Assessment & Plan:   No problem-specific Assessment & Plan notes found for this encounter.     Rubye Oaks, NP 01/25/2023

## 2023-01-25 NOTE — Assessment & Plan Note (Signed)
Chronic insomnia- check sleep study  Healthy sleep regimen discussed .

## 2023-01-26 ENCOUNTER — Encounter: Payer: Self-pay | Admitting: Obstetrics and Gynecology

## 2023-01-29 DIAGNOSIS — M810 Age-related osteoporosis without current pathological fracture: Secondary | ICD-10-CM | POA: Diagnosis not present

## 2023-02-08 ENCOUNTER — Ambulatory Visit: Payer: PPO

## 2023-02-08 DIAGNOSIS — G4733 Obstructive sleep apnea (adult) (pediatric): Secondary | ICD-10-CM

## 2023-02-08 DIAGNOSIS — R0683 Snoring: Secondary | ICD-10-CM

## 2023-02-15 DIAGNOSIS — G4733 Obstructive sleep apnea (adult) (pediatric): Secondary | ICD-10-CM | POA: Diagnosis not present

## 2023-02-22 ENCOUNTER — Other Ambulatory Visit: Payer: Self-pay | Admitting: Family Medicine

## 2023-03-10 ENCOUNTER — Encounter: Payer: Self-pay | Admitting: Orthopedic Surgery

## 2023-03-10 ENCOUNTER — Other Ambulatory Visit: Payer: Self-pay

## 2023-03-10 ENCOUNTER — Ambulatory Visit: Payer: PPO | Admitting: Orthopedic Surgery

## 2023-03-10 DIAGNOSIS — M25562 Pain in left knee: Secondary | ICD-10-CM

## 2023-03-10 DIAGNOSIS — M25462 Effusion, left knee: Secondary | ICD-10-CM

## 2023-03-10 MED ORDER — METHYLPREDNISOLONE ACETATE 40 MG/ML IJ SUSP
40.0000 mg | INTRAMUSCULAR | Status: AC | PRN
Start: 2023-03-10 — End: 2023-03-10
  Administered 2023-03-10: 40 mg via INTRA_ARTICULAR

## 2023-03-10 MED ORDER — LIDOCAINE HCL 1 % IJ SOLN
5.0000 mL | INTRAMUSCULAR | Status: AC | PRN
Start: 2023-03-10 — End: 2023-03-10
  Administered 2023-03-10: 5 mL

## 2023-03-10 MED ORDER — BUPIVACAINE HCL 0.25 % IJ SOLN
4.0000 mL | INTRAMUSCULAR | Status: AC | PRN
Start: 2023-03-10 — End: 2023-03-10
  Administered 2023-03-10: 4 mL via INTRA_ARTICULAR

## 2023-03-10 NOTE — Progress Notes (Signed)
Office Visit Note   Patient: Abigail Marks           Date of Birth: Aug 05, 1956           MRN: [id number] Visit Date: 03/10/2023 Requested by: Joaquim Nam, MD [address] [place],  [place] [address] PCP: Joaquim Nam, MD  Subjective: Chief Complaint  Patient presents with   Left Knee - Pain    HPI: Abigail Marks is a 67 y.o. female who presents to the office reporting left knee pain.  Has known history of episodic left knee pain on the medial side.  She has a known diagnosis of mild arthritis but she states this does not really feel like that.  Has improved since 2 to 3 weeks ago.  Taking some over-the-counter medication without much relief.  Denies any back pain or definite mechanical symptoms..                ROS: All systems reviewed are negative as they relate to the chief complaint within the history of present illness.  Patient denies fevers or chills.  Assessment & Plan: Visit Diagnoses:  1. Left knee pain, unspecified chronicity     Plan: Impression is left knee pain with trace effusion and medial joint line tenderness.  Statistically this may represent degenerative meniscal tear.  Aspiration injection performed today.  She will follow-up as needed but these injections have helped her in the past.  Follow-Up Instructions: No follow-ups on file.   Orders:  Orders Placed This Encounter  Procedures   XR KNEE 3 VIEW LEFT   No orders of the defined types were placed in this encounter.     Procedures: Large Joint Inj: L knee on 03/10/2023 9:15 AM Indications: diagnostic evaluation, joint swelling and pain Details: 18 G 1.5 in needle, superolateral approach  Arthrogram: No  Medications: 5 mL lidocaine 1 %; 40 mg methylPREDNISolone acetate 40 MG/ML; 4 mL bupivacaine 0.25 % Outcome: tolerated well, no immediate complications Procedure, treatment alternatives, risks and benefits explained, specific risks discussed. Consent was given by the patient.  Immediately prior to procedure a time out was called to verify the correct patient, procedure, equipment, support staff and site/side marked as required. Patient was prepped and draped in the usual sterile fashion.       Clinical Data: No additional findings.  Objective: Vital Signs: There were no vitals taken for this visit.  Physical Exam:  Constitutional: Patient appears well-developed HEENT:  Head: Normocephalic Eyes:EOM are normal Neck: Normal range of motion Cardiovascular: Normal rate Pulmonary/chest: Effort normal Neurologic: Patient is alert Skin: Skin is warm Psychiatric: Patient has normal mood and affect  Ortho Exam: Ortho exam demonstrates full range of motion at left knee with trace effusion.  Medial joint line tenderness is present.  Collateral crucial ligaments are stable.  Pedal pulses palpable.  No groin pain with internal/external rotation of the leg.  Gait is normal.  Specialty Comments:  No specialty comments available.  Imaging: XR KNEE 3 VIEW LEFT  Result Date: 03/10/2023 AP lateral merchant radiographs left knee reviewed.  Minimal degenerative changes present.  Joint spaces maintained.  Geode present in the posterior aspect of the medial femoral condyle.  Unchanged in appearance from prior radiographs.  No acute fracture    PMFS History: Patient Active Problem List   Diagnosis Date Noted   Snoring 01/21/2023   Tinnitus 01/14/2022   Healthcare maintenance 01/14/2022   Macular degeneration 01/13/2022   Skin lesion 02/26/2021  H/O: hysterectomy 08/29/2020   At high risk for breast cancer 08/29/2020   Osteoporosis 08/29/2020   Dizziness 01/27/2017   Hyperglycemia 10/19/2016   GERD (gastroesophageal reflux disease) 07/06/2014   Advance care planning 04/05/2014   Medicare welcome exam 12/25/2012   Elevated blood pressure reading without diagnosis of hypertension 01/01/2012   Tremor 03/25/2011   UNSPECIFIED PTOSIS OF EYELID 09/28/2007    Hypothyroidism 02/03/2007   HLD (hyperlipidemia) 02/03/2007   Depression 02/03/2007   IBS 02/03/2007   INSOMNIA 02/03/2007   Past Medical History:  Diagnosis Date   Anemia 03/2010   Arthritis    Complication of anesthesia    Depression    GERD (gastroesophageal reflux disease)    Herniated lumbar intervertebral disc 10/2000   Injections   History of hiatal hernia    Hyperlipidemia    Hypothyroidism    PONV (postoperative nausea and vomiting)    Thyroid disease    Hypothyroidism s/p radioactive ablation   Tremor     Family History  Problem Relation Age of Onset   Diabetes Mother    Hypertension Mother    Cancer Mother        breast   Heart disease Mother        MI   Breast cancer Mother    Cancer Sister        Breast   Breast cancer Sister    Heart disease Sister    Healthy Daughter    Hypertension Son    Kidney disease Son    Colon cancer Neg Hx     Past Surgical History:  Procedure Laterality Date   ABDOMINAL HYSTERECTOMY     Partial,prolapse   BLADDER SUSPENSION  03/17/2005   CATARACT EXTRACTION W/PHACO Right 11/11/2015   Procedure: CATARACT EXTRACTION PHACO AND INTRAOCULAR LENS PLACEMENT (IOC);  Surgeon: Sallee Lange, MD;  Location: [facility];  Service: Ophthalmology;  Laterality: Right;  [place] 00:55 AP% 23.0 CDE 22.84 fluid pack lot # [id number]   CATARACT EXTRACTION W/PHACO Left 11/01/2015   Procedure: CATARACT EXTRACTION PHACO AND INTRAOCULAR LENS PLACEMENT (IOC);  Surgeon: Sallee Lange, MD;  Location: [facility];  Service: Ophthalmology;  Laterality: Left;  [place]            1:05 AP%        23 CDE       28.79       fluid casette lot # [id number]  exp5/31/2018   CHOLECYSTECTOMY     COLONOSCOPY WITH PROPOFOL N/A 02/13/2022   Procedure: COLONOSCOPY WITH PROPOFOL;  Surgeon: Wyline Mood, MD;  Location: [facility];  Service: Gastroenterology;  Laterality: N/A;   LIPOMA EXCISION     OOPHORECTOMY     bilateral cysts   Social History   Occupational History    Occupation: Research officer, political party at [employer]  Tobacco Use   Smoking status: Former    Current packs/day: 0.00    Types: Cigarettes    Quit date: 08/17/2004    Years since quitting: 18.5    Passive exposure: Never   Smokeless tobacco: Never   Tobacco comments:    Social smoker.  May not even smoke a pack per week.  01/25/2023 hfb  Vaping Use   Vaping status: Never Used  Substance and Sexual Activity   Alcohol use: Not Currently   Drug use: No   Sexual activity: Not on file

## 2023-03-17 ENCOUNTER — Ambulatory Visit: Payer: PPO | Admitting: Orthopedic Surgery

## 2023-03-18 ENCOUNTER — Other Ambulatory Visit: Payer: Self-pay | Admitting: Family Medicine

## 2023-03-18 ENCOUNTER — Ambulatory Visit: Payer: PPO | Admitting: Orthopedic Surgery

## 2023-03-19 ENCOUNTER — Encounter: Payer: Self-pay | Admitting: Adult Health

## 2023-03-19 ENCOUNTER — Ambulatory Visit: Payer: PPO | Admitting: Adult Health

## 2023-03-24 ENCOUNTER — Ambulatory Visit: Payer: PPO | Admitting: Orthopedic Surgery

## 2023-03-24 ENCOUNTER — Encounter: Payer: Self-pay | Admitting: Orthopedic Surgery

## 2023-03-24 DIAGNOSIS — M25562 Pain in left knee: Secondary | ICD-10-CM

## 2023-03-24 NOTE — Progress Notes (Signed)
Office Visit Note   Patient: Abigail Marks           Date of Birth: 09-29-1955           MRN: [id number] Visit Date: 03/24/2023 Requested by: Joaquim Nam, MD [address] [place],  [place] [address] PCP: Joaquim Nam, MD  Subjective: Chief Complaint  Patient presents with   Left Knee - Pain    HPI: IVY SPRITZER is a 67 y.o. female who presents to the office reporting left knee pain.  Patient had an injection 03/10/2023 patient did well with that injection for about 3 days and then symptoms recurred.  Reports medial pain.  Hard to weight-bear.  She is limping.  Pain does not wake her from sleep.  Tylenol and ibuprofen do not help.  The knee wants to give way.  She had no problems with the knee about 3 months ago..                ROS: All systems reviewed are negative as they relate to the chief complaint within the history of present illness.  Patient denies fevers or chills.  Assessment & Plan: Visit Diagnoses:  1. Left knee pain, unspecified chronicity     Plan: Impression is left knee pain with recurrence of effusion and giving way symptoms.  Radiographs do not show too much in terms of arthritis in the medial compartment.  There is slight joint space narrowing.  Patient had an injection which helped her for several days but that has worn off and she has daily pain with ambulation as well as giving way symptoms.  No symptoms 3 months ago.  Concern at this time would be for meniscal pathology which is causing the effusion and symptoms.  Needs MRI scan of that left knee to rule out meniscal pathology versus stress reaction in the tibia.  Follow-up after that study.  Hinged brace provided for support so she can take care of her dog.  Follow-Up Instructions: No follow-ups on file.   Orders:  Orders Placed This Encounter  Procedures   MR Knee Left w/o contrast   No orders of the defined types were placed in this encounter.     Procedures: No procedures  performed   Clinical Data: No additional findings.  Objective: Vital Signs: There were no vitals taken for this visit.  Physical Exam:  Constitutional: Patient appears well-developed HEENT:  Head: Normocephalic Eyes:EOM are normal Neck: Normal range of motion Cardiovascular: Normal rate Pulmonary/chest: Effort normal Neurologic: Patient is alert Skin: Skin is warm Psychiatric: Patient has normal mood and affect  Ortho Exam: Ortho exam demonstrates medial joint line tenderness with positive McMurray compression testing.  Range of motion full.  Collateral cruciate ligaments stable.  Mild effusion present.  No groin pain with internal/external rotation of the leg.  Specialty Comments:  No specialty comments available.  Imaging: No results found.   PMFS History: Patient Active Problem List   Diagnosis Date Noted   Snoring 01/21/2023   Tinnitus 01/14/2022   Healthcare maintenance 01/14/2022   Macular degeneration 01/13/2022   Skin lesion 02/26/2021   H/O: hysterectomy 08/29/2020   At high risk for breast cancer 08/29/2020   Osteoporosis 08/29/2020   Dizziness 01/27/2017   Hyperglycemia 10/19/2016   GERD (gastroesophageal reflux disease) 07/06/2014   Advance care planning 04/05/2014   Medicare welcome exam 12/25/2012   Elevated blood pressure reading without diagnosis of hypertension 01/01/2012   Tremor 03/25/2011   UNSPECIFIED  PTOSIS OF EYELID 09/28/2007   Hypothyroidism 02/03/2007   HLD (hyperlipidemia) 02/03/2007   Depression 02/03/2007   IBS 02/03/2007   INSOMNIA 02/03/2007   Past Medical History:  Diagnosis Date   Anemia 03/2010   Arthritis    Complication of anesthesia    Depression    GERD (gastroesophageal reflux disease)    Herniated lumbar intervertebral disc 10/2000   Injections   History of hiatal hernia    Hyperlipidemia    Hypothyroidism    PONV (postoperative nausea and vomiting)    Thyroid disease    Hypothyroidism s/p radioactive ablation    Tremor     Family History  Problem Relation Age of Onset   Diabetes Mother    Hypertension Mother    Cancer Mother        breast   Heart disease Mother        MI   Breast cancer Mother    Cancer Sister        Breast   Breast cancer Sister    Heart disease Sister    Healthy Daughter    Hypertension Son    Kidney disease Son    Colon cancer Neg Hx     Past Surgical History:  Procedure Laterality Date   ABDOMINAL HYSTERECTOMY     Partial,prolapse   BLADDER SUSPENSION  03/17/2005   CATARACT EXTRACTION W/PHACO Right 11/11/2015   Procedure: CATARACT EXTRACTION PHACO AND INTRAOCULAR LENS PLACEMENT (IOC);  Surgeon: Sallee Lange, MD;  Location: [facility] ORS;  Service: Ophthalmology;  Laterality: Right;  [place] 00:55 AP% 23.0 CDE 22.84 fluid pack lot # [id number]   CATARACT EXTRACTION W/PHACO Left 11/01/2015   Procedure: CATARACT EXTRACTION PHACO AND INTRAOCULAR LENS PLACEMENT (IOC);  Surgeon: Sallee Lange, MD;  Location: [facility] OR[facility];  Service: Ophthalmology;  Laterality: Left;  [place]            1:05 AP%        23 CDE       28.79       fluid casette lot # [id number]  exp5/31/2018   CHOLECYSTECTOMY     COLONOSCOPY WITH PROPOFOL N/A 02/13/2022   Procedure: COLONOSCOPY WITH PROPOFOL;  Surgeon: Wyline Mood, MD;  Location: [facility] END[facility]COPY;  Service: Gastroenterology;  Laterality: N/A;   LIPOMA EXCISION     OOPHORECTOMY     bilateral cysts   Social History   Occupational History   Occupation: Research officer, political party at [employer]  Tobacco Use   Smoking status: Former    Current packs/day: 0.00    Types: Cigarettes    Quit date: 08/17/2004    Years since quitting: 18.6    Passive exposure: Never   Smokeless tobacco: Never   Tobacco comments:    Social smoker.  May not even smoke a pack per week.  01/25/2023 hfb  Vaping Use   Vaping status: Never Used  Substance and Sexual Activity   Alcohol use: Not Currently   Drug use: No   Sexual activity: Not on file

## 2023-04-12 ENCOUNTER — Ambulatory Visit
Admission: RE | Admit: 2023-04-12 | Discharge: 2023-04-12 | Disposition: A | Discharge status: Home / Self Care | Payer: PPO | Source: Ambulatory Visit | Attending: Orthopedic Surgery | Admitting: Orthopedic Surgery

## 2023-04-12 DIAGNOSIS — M23322 Other meniscus derangements, posterior horn of medial meniscus, left knee: Secondary | ICD-10-CM | POA: Diagnosis not present

## 2023-04-12 DIAGNOSIS — M25462 Effusion, left knee: Secondary | ICD-10-CM | POA: Diagnosis not present

## 2023-04-12 DIAGNOSIS — M25562 Pain in left knee: Secondary | ICD-10-CM

## 2023-04-13 ENCOUNTER — Encounter: Payer: Self-pay | Admitting: Orthopedic Surgery

## 2023-04-13 NOTE — Progress Notes (Signed)
Does she have f/u?

## 2023-04-28 ENCOUNTER — Ambulatory Visit: Payer: PPO | Admitting: Orthopedic Surgery

## 2023-04-28 DIAGNOSIS — M25462 Effusion, left knee: Secondary | ICD-10-CM

## 2023-04-30 ENCOUNTER — Ambulatory Visit: Payer: PPO | Admitting: Orthopedic Surgery

## 2023-04-30 ENCOUNTER — Encounter: Payer: Self-pay | Admitting: Orthopedic Surgery

## 2023-04-30 NOTE — Progress Notes (Signed)
Office Visit Note   Patient: Abigail Marks           Date of Birth: 01/06/1956           MRN: [id number] Visit Date: 04/28/2023 Requested by: Joaquim Nam, MD [address] [place],  [place] [address] PCP: Joaquim Nam, MD  Subjective: Chief Complaint  Patient presents with   Other     Review MRI    HPI: Abigail Marks is a 67 y.o. female who presents to the office reporting left knee pain.  Since she was last seen she has had an MRI scan which is reviewed with the patient.  She describes primarily medial sided pain.  Does not report much of an effusion.  Did have a cortisone injection which did not help.  She is managing with the brace.  The pain comes and goes.  Does have a daughter at home.  Mother had a history of deep vein thrombosis.  Micayah likes to walk for exercise.  Ibuprofen makes her sick.  Has been taking Tylenol.  MRI scan is reviewed with the patient.  Does show reasonably intact articular surfaces in the medial and lateral joint spaces.  A little bit more wear behind the patella.  Degenerative medial meniscal tear is present which is likely her pain generator..                ROS: All systems reviewed are negative as they relate to the chief complaint within the history of present illness.  Patient denies fevers or chills.  Assessment & Plan: Visit Diagnoses:  1. Effusion, left knee     Plan: Impression is left knee medial meniscal tear without much effusion but with continued pain.  Talked a lot about operative and nonoperative treatment options.  She is going to opt for nonoperative treatment at this time.  Should she develop more mechanical symptoms persistent pain or significant and constant effusions and I think that could indicate that the oblique horizontal cleavage type tear could be developing into something that needs surgical removal.  She will let us know if she wants to proceed with intervention.  Follow-Up Instructions: No follow-ups  on file.   Orders:  No orders of the defined types were placed in this encounter.  No orders of the defined types were placed in this encounter.     Procedures: No procedures performed   Clinical Data: No additional findings.  Objective: Vital Signs: There were no vitals taken for this visit.  Physical Exam:  Constitutional: Patient appears well-developed HEENT:  Head: Normocephalic Eyes:EOM are normal Neck: Normal range of motion Cardiovascular: Normal rate Pulmonary/chest: Effort normal Neurologic: Patient is alert Skin: Skin is warm Psychiatric: Patient has normal mood and affect  Ortho Exam: Ortho exam demonstrates normal gait alignment.  Good quad strength.  Full range of motion of the left knee.  Medial joint line tenderness is present.  No effusion is present.  Collateral and cruciate ligaments are stable.  Patellofemoral crepitus is present.  Specialty Comments:  No specialty comments available.  Imaging: No results found.   PMFS History: Patient Active Problem List   Diagnosis Date Noted   Snoring 01/21/2023   Tinnitus 01/14/2022   Healthcare maintenance 01/14/2022   Macular degeneration 01/13/2022   Skin lesion 02/26/2021   H/O: hysterectomy 08/29/2020   At high risk for breast cancer 08/29/2020   Osteoporosis 08/29/2020   Dizziness 01/27/2017   Hyperglycemia 10/19/2016   GERD (gastroesophageal reflux  disease) 07/06/2014   Advance care planning 04/05/2014   Medicare welcome exam 12/25/2012   Elevated blood pressure reading without diagnosis of hypertension 01/01/2012   Tremor 03/25/2011   UNSPECIFIED PTOSIS OF EYELID 09/28/2007   Hypothyroidism 02/03/2007   HLD (hyperlipidemia) 02/03/2007   Depression 02/03/2007   IBS 02/03/2007   INSOMNIA 02/03/2007   Past Medical History:  Diagnosis Date   Anemia 03/2010   Arthritis    Complication of anesthesia    Depression    GERD (gastroesophageal reflux disease)    Herniated lumbar intervertebral  disc 10/2000   Injections   History of hiatal hernia    Hyperlipidemia    Hypothyroidism    PONV (postoperative nausea and vomiting)    Thyroid disease    Hypothyroidism s/p radioactive ablation   Tremor     Family History  Problem Relation Age of Onset   Diabetes Mother    Hypertension Mother    Cancer Mother        breast   Heart disease Mother        MI   Breast cancer Mother    Cancer Sister        Breast   Breast cancer Sister    Heart disease Sister    Healthy Daughter    Hypertension Son    Kidney disease Son    Colon cancer Neg Hx     Past Surgical History:  Procedure Laterality Date   ABDOMINAL HYSTERECTOMY     Partial,prolapse   BLADDER SUSPENSION  03/17/2005   CATARACT EXTRACTION W/PHACO Right 11/11/2015   Procedure: CATARACT EXTRACTION PHACO AND INTRAOCULAR LENS PLACEMENT (IOC);  Surgeon: Sallee Lange, MD;  Location: [facility] ORS;  Service: Ophthalmology;  Laterality: Right;  [place] 00:55 AP% 23.0 CDE 22.84 fluid pack lot # [id number]   CATARACT EXTRACTION W/PHACO Left 11/01/2015   Procedure: CATARACT EXTRACTION PHACO AND INTRAOCULAR LENS PLACEMENT (IOC);  Surgeon: Sallee Lange, MD;  Location: [facility] ORS;  Service: Ophthalmology;  Laterality: Left;  [place]            1:05 AP%        23 CDE       28.79       fluid casette lot # [id number]  exp5/31/2018   CHOLECYSTECTOMY     COLONOSCOPY WITH PROPOFOL N/A 02/13/2022   Procedure: COLONOSCOPY WITH PROPOFOL;  Surgeon: Wyline Mood, MD;  Location: [facility] ENDOSCOPY;  Service: Gastroenterology;  Laterality: N/A;   LIPOMA EXCISION     OOPHORECTOMY     bilateral cysts   Social History   Occupational History   Occupation: Research officer, political party at [employer]  Tobacco Use   Smoking status: Former    Current packs/day: 0.00    Types: Cigarettes    Quit date: 08/17/2004    Years since quitting: 18.7    Passive exposure: Never   Smokeless tobacco: Never   Tobacco comments:    Social smoker.  May not even smoke a pack per  week.  01/25/2023 hfb  Vaping Use   Vaping status: Never Used  Substance and Sexual Activity   Alcohol use: Not Currently   Drug use: No   Sexual activity: Not on file

## 2023-05-17 ENCOUNTER — Other Ambulatory Visit: Payer: Self-pay | Admitting: Surgical

## 2023-05-17 ENCOUNTER — Encounter: Payer: Self-pay | Admitting: Orthopedic Surgery

## 2023-05-17 MED ORDER — TRAMADOL HCL 50 MG PO TABS: 50.0000 mg | ORAL_TABLET | Freq: Every day | ORAL | 0 refills | Status: DC | PRN

## 2023-05-17 NOTE — Telephone Encounter (Signed)
Thx blue sheet done thx

## 2023-05-17 NOTE — Telephone Encounter (Signed)
Sent in tramadol to help with pain

## 2023-05-21 ENCOUNTER — Ambulatory Visit: Payer: PPO | Admitting: Adult Health

## 2023-05-21 ENCOUNTER — Encounter: Payer: Self-pay | Admitting: Adult Health

## 2023-05-21 VITALS — BP 130/60 | HR 65 | Temp 98.0°F | Ht 63.0 in | Wt 148.6 lb

## 2023-05-21 DIAGNOSIS — Z23 Encounter for immunization: Secondary | ICD-10-CM

## 2023-05-21 DIAGNOSIS — G4733 Obstructive sleep apnea (adult) (pediatric): Secondary | ICD-10-CM | POA: Insufficient documentation

## 2023-05-21 DIAGNOSIS — S83232A Complex tear of medial meniscus, current injury, left knee, initial encounter: Secondary | ICD-10-CM | POA: Diagnosis not present

## 2023-05-21 NOTE — Assessment & Plan Note (Signed)
Mild obstructive sleep apnea-with significant symptom burden. Begin CPAP therapy begin auto CPAP 5-15.  Patient wants to try a DreamWear nasal mask.  Patient education given on sleep apnea whenever sleep study results  - discussed how weight can impact sleep and risk for sleep disordered breathing - discussed options to assist with weight loss: combination of diet modification, cardiovascular and strength training exercises   - had an extensive discussion regarding the adverse health consequences related to untreated sleep disordered breathing - specifically discussed the risks for hypertension, coronary artery disease, cardiac dysrhythmias, cerebrovascular disease, and diabetes - lifestyle modification discussed   - discussed how sleep disruption can increase risk of accidents, particularly when driving - safe driving practices were discussed   Plan  Patient Instructions  Begin CPAP At bedtime, wear all night long for at least 6hr or more  Try Dream wear nasal mask .  Healthy sleep regimen Do not drive if sleepy Follow-up in 3 months and As needed  -Friday evening Virtual clinic .

## 2023-05-21 NOTE — Progress Notes (Signed)
@Patient  ID: Abigail Marks, female    DOB: [birth year], 67 y.o.   MRN: [id number]  Chief Complaint  Patient presents with   Follow-up    Referring provider: Joaquim Nam, MD  HPI: 67 year old female seen for sleep consult January 25, 2023 for snoring and daytime sleepiness found to have mild obstructive sleep apnea  TEST/EVENTS :  Home sleep study February 08, 2023 Home sleep study AHI 13.3/hour and SpO2 low at 81%  05/21/2023 Follow up : OSA  Patient presents for a follow-up visit.  Patient was seen in June for sleep consult for snoring, daytime sleepiness.  She was set up for home sleep study was done February 08, 2023 that showed mild sleep apnea with AHI 13.3/hour and SpO2 low at 81%.  We discussed her sleep study results in detail went over treatment options.  We discussed weight loss, oral appliance, positional sleep and CPAP therapy.  Patient would like proceed with CPAP therapy .   Allergies  Allergen Reactions   Atorvastatin     REACTION: muscle pain   Bupropion Hcl     REACTION: decreased  BP   Buspar [Buspirone]     Inc in appetite   Ezetimibe-Simvastatin     REACTION: nausea   Lunesta [Eszopiclone] Other (See Comments)    Lack of effect   Mirtazapine     REACTION: swelling   Molnupiravir     GI upset.    Nsaids Other (See Comments)    gi   Omeprazole-Sodium Bicarbonate     REACTION: GI upset   Paroxetine     REACTION: felt bad   Raloxifene     REACTION: leg pain   Rosuvastatin     REACTION: increased ALT   Temazepam     nightmares   Trazodone And Nefazodone     nightmares   Zolpidem Tartrate     intolerant    Immunization History  Administered Date(s) Administered   Influenza,inj,Quad PF,6+ Mos 06/01/2019   Influenza-Unspecified 06/05/2014, 05/18/2015, 06/18/2020   PFIZER(Purple Top)SARS-COV-2 Vaccination 08/21/2019, 09/11/2019, 08/13/2020   PNEUMOCOCCAL CONJUGATE-20 01/13/2022   Pneumococcal Polysaccharide-23 10/31/2020   Tdap 12/23/2012    Zoster, Live 08/18/2015    Past Medical History:  Diagnosis Date   Anemia 03/2010   Arthritis    Complication of anesthesia    Depression    GERD (gastroesophageal reflux disease)    Herniated lumbar intervertebral disc 10/2000   Injections   History of hiatal hernia    Hyperlipidemia    Hypothyroidism    PONV (postoperative nausea and vomiting)    Thyroid disease    Hypothyroidism s/p radioactive ablation   Tremor     Tobacco History: Social History   Tobacco Use  Smoking Status Former   Current packs/day: 0.00   Types: Cigarettes   Quit date: 08/17/2004   Years since quitting: 18.7   Passive exposure: Never  Smokeless Tobacco Never  Tobacco Comments   Social smoker.  May not even smoke a pack per week.  01/25/2023 hfb   Counseling given: Not Answered Tobacco comments: Social smoker.  May not even smoke a pack per week.  01/25/2023 hfb   Outpatient Medications Prior to Visit  Medication Sig Dispense Refill   Calcium Carbonate-Vitamin D 600-400 MG-UNIT tablet Take 2 tablets by mouth daily.      FLUoxetine (PROZAC) 20 MG capsule TAKE 2 CAPSULES BY MOUTH DAILY. 180 capsule 1   levothyroxine (SYNTHROID) 100 MCG tablet TAKE 1 TABLET EVERY DAY ON EMPTY  STOMACHWITH A GLASS OF WATER AT LEAST 30-60 MINBEFORE BREAKFAST 90 tablet 3   Multiple Vitamins-Minerals (ICAPS AREDS 2 PO) Take 1 tablet by mouth in the morning and at bedtime.     pravastatin (PRAVACHOL) 10 MG tablet TAKE 1 TABLET BY MOUTH DAILY 90 tablet 3   primidone (MYSOLINE) 50 MG tablet TAKE 1 TABLET BY MOUTH AT BEDTIME 90 tablet 1   PROLIA 60 MG/ML SOSY injection Inject 60 mg into the skin every 6 (six) months.     traMADol (ULTRAM) 50 MG tablet Take 1 tablet (50 mg total) by mouth daily as needed. 14 tablet 0   No facility-administered medications prior to visit.     Review of Systems:   Constitutional:   No  weight loss, night sweats,  Fevers, chills,  +fatigue, or  lassitude.  HEENT:   No headaches,   Difficulty swallowing,  Tooth/dental problems, or  Sore throat,                No sneezing, itching, ear ache, nasal congestion, post nasal drip,   CV:  No chest pain,  Orthopnea, PND, swelling in lower extremities, anasarca, dizziness, palpitations, syncope.   GI  No heartburn, indigestion, abdominal pain, nausea, vomiting, diarrhea, change in bowel habits, loss of appetite, bloody stools.   Resp: No shortness of breath with exertion or at rest.  No excess mucus, no productive cough,  No non-productive cough,  No coughing up of blood.  No change in color of mucus.  No wheezing.  No chest wall deformity  Skin: no rash or lesions.  GU: no dysuria, change in color of urine, no urgency or frequency.  No flank pain, no hematuria   MS:  No joint pain or swelling.  No decreased range of motion.  No back pain.    Physical Exam  BP 130/60 (BP Location: Left Arm, Patient Position: Sitting, Cuff Size: Normal)   Pulse 65   Temp 98 F (36.7 C) (Temporal)   Ht 5\' 3"  (1.6 m)   Wt 148 lb 9.6 oz (67.4 kg)   SpO2 97%   BMI 26.32 kg/m   GEN: A/Ox3; pleasant , NAD, well nourished    HEENT:  [place]/AT,   NOSE-clear, THROAT-clear, no lesions, no postnasal drip or exudate noted. Class 2 MP airway   NECK:  Supple w/ fair ROM; no JVD; normal carotid impulses w/o bruits; no thyromegaly or nodules palpated; no lymphadenopathy.    RESP  Clear  P & A; w/o, wheezes/ rales/ or rhonchi. no accessory muscle use, no dullness to percussion  CARD:  RRR, no m/r/g, no peripheral edema, pulses intact, no cyanosis or clubbing.  GI:   Soft & nt; nml bowel sounds; no organomegaly or masses detected.   Musco: Warm bil, no deformities or joint swelling noted.   Neuro: alert, no focal deficits noted.    Skin: Warm, no lesions or rashes    Lab Results:    BMET   BNP No results found for: "BNP"  ProBNP No results found for: "PROBNP"  Imaging: No results found.  Administration History     None            No data to display          No results found for: "NITRICOXIDE"      Assessment & Plan:   OSA (obstructive sleep apnea) Mild obstructive sleep apnea-with significant symptom burden. Begin CPAP therapy begin auto CPAP 5-15.  Patient wants to try a DreamWear  nasal mask.  Patient education given on sleep apnea whenever sleep study results  - discussed how weight can impact sleep and risk for sleep disordered breathing - discussed options to assist with weight loss: combination of diet modification, cardiovascular and strength training exercises   - had an extensive discussion regarding the adverse health consequences related to untreated sleep disordered breathing - specifically discussed the risks for hypertension, coronary artery disease, cardiac dysrhythmias, cerebrovascular disease, and diabetes - lifestyle modification discussed   - discussed how sleep disruption can increase risk of accidents, particularly when driving - safe driving practices were discussed   Plan  Patient Instructions  Begin CPAP At bedtime, wear all night long for at least 6hr or more  Try Dream wear nasal mask .  Healthy sleep regimen Do not drive if sleepy Follow-up in 3 months and As needed  -Friday evening Virtual clinic .       Rubye Oaks, NP 05/21/2023

## 2023-05-21 NOTE — Patient Instructions (Addendum)
Begin CPAP At bedtime, wear all night long for at least 6hr or more  Try Dream wear nasal mask .  Healthy sleep regimen Do not drive if sleepy Follow-up in 3 months and As needed  -Friday evening Virtual clinic .

## 2023-05-25 ENCOUNTER — Telehealth: Payer: Self-pay | Admitting: Orthopedic Surgery

## 2023-05-25 NOTE — Telephone Encounter (Signed)
Left voicemail message for patient to return call to discuss surgery date for left knee arthroscopy with Dr.  August Saucer.  Left my name and direct number. Will wait to hear from patient.

## 2023-05-27 ENCOUNTER — Encounter: Payer: Self-pay | Admitting: Orthopedic Surgery

## 2023-05-31 ENCOUNTER — Other Ambulatory Visit: Payer: Self-pay | Admitting: Surgical

## 2023-05-31 ENCOUNTER — Encounter: Payer: Self-pay | Admitting: Orthopedic Surgery

## 2023-05-31 DIAGNOSIS — X58XXXA Exposure to other specified factors, initial encounter: Secondary | ICD-10-CM | POA: Diagnosis not present

## 2023-05-31 DIAGNOSIS — Y999 Unspecified external cause status: Secondary | ICD-10-CM | POA: Diagnosis not present

## 2023-05-31 DIAGNOSIS — M2242 Chondromalacia patellae, left knee: Secondary | ICD-10-CM | POA: Diagnosis not present

## 2023-05-31 DIAGNOSIS — G8918 Other acute postprocedural pain: Secondary | ICD-10-CM | POA: Diagnosis not present

## 2023-05-31 DIAGNOSIS — S83242A Other tear of medial meniscus, current injury, left knee, initial encounter: Secondary | ICD-10-CM | POA: Diagnosis not present

## 2023-05-31 MED ORDER — METHOCARBAMOL 500 MG PO TABS: 500.0000 mg | ORAL_TABLET | Freq: Three times a day (TID) | ORAL | 1 refills | Status: DC | PRN

## 2023-05-31 MED ORDER — OXYCODONE HCL 5 MG PO TABS: 5.0000 mg | ORAL_TABLET | Freq: Four times a day (QID) | ORAL | 0 refills | Status: DC | PRN

## 2023-05-31 MED ORDER — ASPIRIN 81 MG PO CHEW: 81.0000 mg | CHEWABLE_TABLET | Freq: Every day | ORAL | 0 refills | Status: AC

## 2023-06-01 ENCOUNTER — Telehealth: Payer: Self-pay | Admitting: *Deleted

## 2023-06-01 ENCOUNTER — Other Ambulatory Visit: Payer: Self-pay | Admitting: Surgical

## 2023-06-01 MED ORDER — HYDROCODONE-ACETAMINOPHEN 5-325 MG PO TABS: 1.0000 | ORAL_TABLET | ORAL | 0 refills | Status: DC | PRN

## 2023-06-01 NOTE — Telephone Encounter (Signed)
Pt called had surgery yesterday and states the meds that were prescribes to her has caused some severe headaches and wants to know if you can prescribe something different.

## 2023-06-01 NOTE — Telephone Encounter (Signed)
Sent in norco, dont take with oxycodone

## 2023-06-01 NOTE — Telephone Encounter (Signed)
Pt was called and advised and stated understanding

## 2023-06-07 ENCOUNTER — Encounter: Payer: Self-pay | Admitting: Surgical

## 2023-06-07 ENCOUNTER — Ambulatory Visit (INDEPENDENT_AMBULATORY_CARE_PROVIDER_SITE_OTHER): Payer: PPO | Admitting: Surgical

## 2023-06-07 ENCOUNTER — Other Ambulatory Visit: Payer: Self-pay | Admitting: Family Medicine

## 2023-06-07 DIAGNOSIS — Z9889 Other specified postprocedural states: Secondary | ICD-10-CM

## 2023-06-07 DIAGNOSIS — M25462 Effusion, left knee: Secondary | ICD-10-CM

## 2023-06-07 MED ORDER — HYDROCODONE-ACETAMINOPHEN 5-325 MG PO TABS: 1.0000 | ORAL_TABLET | Freq: Four times a day (QID) | ORAL | 0 refills | Status: DC | PRN

## 2023-06-08 ENCOUNTER — Encounter: Payer: Self-pay | Admitting: Surgical

## 2023-06-08 NOTE — Progress Notes (Signed)
Post-Op Visit Note   Patient: Abigail Marks           Date of Birth: 18-Apr-1956           MRN: [id number] Visit Date: 06/07/2023 PCP: Joaquim Nam, MD   Assessment & Plan:  Chief Complaint:  Chief Complaint  Patient presents with   Left Knee - Follow-up    Left knee scope 05/31/2023   Visit Diagnoses: No diagnosis found.  Plan: Patient is a 67 year old female who presents s/p left knee arthroscopy on 05/31/2023.  About a week out from surgery.  She has ran out of hydrocodone and is just taking Tylenol now.  No fevers or chills.  No chest pain/shortness of breath/calf pain.  She is ambulatory without assistance though she still has some pain with ambulation.  She has upcoming beach trip with some of her girlfriends but she feels that she is going to back out of this trip as she does not want to aggravate her knee pain further which I think is reasonable.  Taking aspirin for DVT prophylaxis.  On exam, patient has 3 degrees extension and 90 degrees of knee flexion.  She does have mild to moderate effusion that is present.  Incisions are healing well with sutures intact.  Sutures removed and replaced with Steri-Strips.  No calf tenderness.  Negative Homans' sign.  Able to perform straight leg raise without extensor lag.  No evidence of cellulitis.  Plan is continue with knee range of motion exercises.  Continue with straight leg raises to optimize quad strength.  Start stationary bike exercises.  Need to focus on getting her leg a little straighter and this was demonstrated for her today.  Did offer aspiration but she wants to hold off on this.  Norco refilled.  Follow-up in 4 weeks for clinical recheck with Dr. August Saucer.  Follow-Up Instructions: No follow-ups on file.   Orders:  No orders of the defined types were placed in this encounter.  Meds ordered this encounter  Medications   HYDROcodone-acetaminophen (NORCO/VICODIN) 5-325 MG tablet    Sig: Take 1 tablet by mouth every 6  (six) hours as needed for moderate pain (pain score 4-6). Do not take with oxycodone    Dispense:  24 tablet    Refill:  0    Recent oxycodone caused headaches    Imaging: No results found.  PMFS History: Patient Active Problem List   Diagnosis Date Noted   OSA (obstructive sleep apnea) 05/21/2023   Snoring 01/21/2023   Tinnitus 01/14/2022   Healthcare maintenance 01/14/2022   Macular degeneration 01/13/2022   Skin lesion 02/26/2021   H/O: hysterectomy 08/29/2020   At high risk for breast cancer 08/29/2020   Osteoporosis 08/29/2020   Dizziness 01/27/2017   Hyperglycemia 10/19/2016   GERD (gastroesophageal reflux disease) 07/06/2014   Advance care planning 04/05/2014   Medicare welcome exam 12/25/2012   Elevated blood pressure reading without diagnosis of hypertension 01/01/2012   Tremor 03/25/2011   UNSPECIFIED PTOSIS OF EYELID 09/28/2007   Hypothyroidism 02/03/2007   HLD (hyperlipidemia) 02/03/2007   Depression 02/03/2007   IBS 02/03/2007   INSOMNIA 02/03/2007   Past Medical History:  Diagnosis Date   Anemia 03/2010   Arthritis    Complication of anesthesia    Depression    GERD (gastroesophageal reflux disease)    Herniated lumbar intervertebral disc 10/2000   Injections   History of hiatal hernia    Hyperlipidemia    Hypothyroidism    PONV (postoperative  nausea and vomiting)    Thyroid disease    Hypothyroidism s/p radioactive ablation   Tremor     Family History  Problem Relation Age of Onset   Diabetes Mother    Hypertension Mother    Cancer Mother        breast   Heart disease Mother        MI   Breast cancer Mother    Cancer Sister        Breast   Breast cancer Sister    Heart disease Sister    Healthy Daughter    Hypertension Son    Kidney disease Son    Colon cancer Neg Hx     Past Surgical History:  Procedure Laterality Date   ABDOMINAL HYSTERECTOMY     Partial,prolapse   BLADDER SUSPENSION  03/17/2005   CATARACT EXTRACTION W/PHACO  Right 11/11/2015   Procedure: CATARACT EXTRACTION PHACO AND INTRAOCULAR LENS PLACEMENT (IOC);  Surgeon: Sallee Lange, MD;  Location: [facility] ORS;  Service: Ophthalmology;  Laterality: Right;  [place] 00:55 AP% 23.0 CDE 22.84 fluid pack lot # [id number]   CATARACT EXTRACTION W/PHACO Left 11/01/2015   Procedure: CATARACT EXTRACTION PHACO AND INTRAOCULAR LENS PLACEMENT (IOC);  Surgeon: Sallee Lange, MD;  Location: [facility] ORS;  Service: Ophthalmology;  Laterality: Left;  [place]            1:05 AP%        23 CDE       28.79       fluid casette lot # [id number]  exp5/31/2018   CHOLECYSTECTOMY     COLONOSCOPY WITH PROPOFOL N/A 02/13/2022   Procedure: COLONOSCOPY WITH PROPOFOL;  Surgeon: Wyline Mood, MD;  Location: [facility] ENDOSCOPY;  Service: Gastroenterology;  Laterality: N/A;   LIPOMA EXCISION     OOPHORECTOMY     bilateral cysts   Social History   Occupational History   Occupation: Research officer, political party at [employer]  Tobacco Use   Smoking status: Former    Current packs/day: 0.00    Types: Cigarettes    Quit date: 08/17/2004    Years since quitting: 18.8    Passive exposure: Never   Smokeless tobacco: Never   Tobacco comments:    Social smoker.  May not even smoke a pack per week.  01/25/2023 hfb  Vaping Use   Vaping status: Never Used  Substance and Sexual Activity   Alcohol use: Not Currently   Drug use: No   Sexual activity: Not on file

## 2023-06-14 DIAGNOSIS — G4733 Obstructive sleep apnea (adult) (pediatric): Secondary | ICD-10-CM | POA: Diagnosis not present

## 2023-06-14 DIAGNOSIS — G4709 Other insomnia: Secondary | ICD-10-CM | POA: Diagnosis not present

## 2023-07-08 DIAGNOSIS — H43813 Vitreous degeneration, bilateral: Secondary | ICD-10-CM | POA: Diagnosis not present

## 2023-07-08 DIAGNOSIS — Z961 Presence of intraocular lens: Secondary | ICD-10-CM | POA: Diagnosis not present

## 2023-07-08 DIAGNOSIS — H353131 Nonexudative age-related macular degeneration, bilateral, early dry stage: Secondary | ICD-10-CM | POA: Diagnosis not present

## 2023-07-08 DIAGNOSIS — H524 Presbyopia: Secondary | ICD-10-CM | POA: Diagnosis not present

## 2023-07-08 DIAGNOSIS — Z9889 Other specified postprocedural states: Secondary | ICD-10-CM | POA: Diagnosis not present

## 2023-07-09 ENCOUNTER — Ambulatory Visit (INDEPENDENT_AMBULATORY_CARE_PROVIDER_SITE_OTHER): Payer: PPO | Admitting: Orthopedic Surgery

## 2023-07-09 ENCOUNTER — Encounter: Payer: Self-pay | Admitting: Orthopedic Surgery

## 2023-07-09 DIAGNOSIS — Z9889 Other specified postprocedural states: Secondary | ICD-10-CM

## 2023-07-09 MED ORDER — TRAMADOL HCL 50 MG PO TABS
ORAL_TABLET | ORAL | 0 refills | Status: DC
Start: 2023-07-09 — End: 2024-05-01

## 2023-07-09 NOTE — Progress Notes (Unsigned)
Post-Op Visit Note   Patient: Abigail Marks           Date of Birth: 10/03/1955           MRN: [id number] Visit Date: 07/09/2023 PCP: Abigail Nam, MD   Assessment & Plan:  Chief Complaint:  Chief Complaint  Patient presents with   Left Knee - Routine Post Op   Visit Diagnoses:  1. S/P left knee arthroscopy     Plan: [name] is a patient who underwent left knee arthroscopy and debridement on 05/31/2023.  Still has some pain in that left knee region.  Taking Tylenol.  Doing stationary bike but also walking on the treadmill.  Overall doing well.  Cannot really take anti-inflammatories.  Takes occasional Norco.  On examination she has about a 5 degree flexion contracture with trace effusion and full flexion.  No real focal joint line tenderness.  Plan is to transition all TRAM in 6-week return for clinical recheck.  No calf tenderness negative Homans today.  Follow-Up Instructions: No follow-ups on file.   Orders:  No orders of the defined types were placed in this encounter.  Meds ordered this encounter  Medications   traMADol (ULTRAM) 50 MG tablet    Sig: 1 po q 8 hrs prn pain    Dispense:  30 tablet    Refill:  0    Imaging: No results found.  PMFS History: Patient Active Problem List   Diagnosis Date Noted   OSA (obstructive sleep apnea) 05/21/2023   Snoring 01/21/2023   Tinnitus 01/14/2022   Healthcare maintenance 01/14/2022   Macular degeneration 01/13/2022   Skin lesion 02/26/2021   H/O: hysterectomy 08/29/2020   At high risk for breast cancer 08/29/2020   Osteoporosis 08/29/2020   Dizziness 01/27/2017   Hyperglycemia 10/19/2016   GERD (gastroesophageal reflux disease) 07/06/2014   Advance care planning 04/05/2014   Medicare welcome exam 12/25/2012   Elevated blood pressure reading without diagnosis of hypertension 01/01/2012   Tremor 03/25/2011   UNSPECIFIED PTOSIS OF EYELID 09/28/2007   Hypothyroidism 02/03/2007   HLD (hyperlipidemia)  02/03/2007   Depression 02/03/2007   IBS 02/03/2007   INSOMNIA 02/03/2007   Past Medical History:  Diagnosis Date   Anemia 03/2010   Arthritis    Complication of anesthesia    Depression    GERD (gastroesophageal reflux disease)    Herniated lumbar intervertebral disc 10/2000   Injections   History of hiatal hernia    Hyperlipidemia    Hypothyroidism    PONV (postoperative nausea and vomiting)    Thyroid disease    Hypothyroidism s/p radioactive ablation   Tremor     Family History  Problem Relation Age of Onset   Diabetes Mother    Hypertension Mother    Cancer Mother        breast   Heart disease Mother        MI   Breast cancer Mother    Cancer Sister        Breast   Breast cancer Sister    Heart disease Sister    Healthy Daughter    Hypertension Son    Kidney disease Son    Colon cancer Neg Hx     Past Surgical History:  Procedure Laterality Date   ABDOMINAL HYSTERECTOMY     Partial,prolapse   BLADDER SUSPENSION  03/17/2005   CATARACT EXTRACTION W/PHACO Right 11/11/2015   Procedure: CATARACT EXTRACTION PHACO AND INTRAOCULAR LENS PLACEMENT (IOC);  Surgeon: Sallee Lange,  MD;  Location: [facility] ORS;  Service: Ophthalmology;  Laterality: Right;  [place] 00:55 AP% 23.0 CDE 22.84 fluid pack lot # [id number]   CATARACT EXTRACTION W/PHACO Left 11/01/2015   Procedure: CATARACT EXTRACTION PHACO AND INTRAOCULAR LENS PLACEMENT (IOC);  Surgeon: Sallee Lange, MD;  Location: [facility] ORS;  Service: Ophthalmology;  Laterality: Left;  [place]            1:05 AP%        23 CDE       28.79       fluid casette lot # [id number]  exp5/31/2018   CHOLECYSTECTOMY     COLONOSCOPY WITH PROPOFOL N/A 02/13/2022   Procedure: COLONOSCOPY WITH PROPOFOL;  Surgeon: Wyline Mood, MD;  Location: [facility] ENDOSCOPY;  Service: Gastroenterology;  Laterality: N/A;   LIPOMA EXCISION     OOPHORECTOMY     bilateral cysts   Social History   Occupational History   Occupation: Research officer, political party at [employer]  Tobacco Use   Smoking status: Former    Current packs/day: 0.00    Types: Cigarettes    Quit date: 08/17/2004    Years since quitting: 18.9    Passive exposure: Never   Smokeless tobacco: Never   Tobacco comments:    Social smoker.  May not even smoke a pack per week.  01/25/2023 hfb  Vaping Use   Vaping status: Never Used  Substance and Sexual Activity   Alcohol use: Not Currently   Drug use: No   Sexual activity: Not on file

## 2023-07-15 DIAGNOSIS — G4733 Obstructive sleep apnea (adult) (pediatric): Secondary | ICD-10-CM | POA: Diagnosis not present

## 2023-07-15 DIAGNOSIS — G4709 Other insomnia: Secondary | ICD-10-CM | POA: Diagnosis not present

## 2023-07-26 DIAGNOSIS — M810 Age-related osteoporosis without current pathological fracture: Secondary | ICD-10-CM | POA: Diagnosis not present

## 2023-07-27 ENCOUNTER — Encounter: Payer: Self-pay | Admitting: Adult Health

## 2023-07-27 ENCOUNTER — Telehealth: Payer: PPO | Admitting: Adult Health

## 2023-07-27 DIAGNOSIS — G4733 Obstructive sleep apnea (adult) (pediatric): Secondary | ICD-10-CM

## 2023-07-27 NOTE — Patient Instructions (Signed)
Continue on CPAP At bedtime, wear all night long for at least 6hr or more  Healthy sleep regimen Do not drive if sleepy Follow-up in 6 months and As needed  -Virtual or In person

## 2023-07-27 NOTE — Assessment & Plan Note (Signed)
Excellent control and compliance Adjust CPAP pressure 7 to 12cmH2o.  Discuss with DME regarding headgear issues .   Plan  There are no Patient Instructions on file for this visit.

## 2023-07-27 NOTE — Progress Notes (Signed)
Virtual Visit via Video Note  I connected with Corey Skains on 07/27/23 at  2:00 PM EST by a video enabled telemedicine application and verified that I am speaking with the correct person using two identifiers.  Location: Patient: Home  Provider: Office    I discussed the limitations of evaluation and management by telemedicine and the availability of in person appointments. The patient expressed understanding and agreed to proceed.  History of Present Illness: 67 year old female seen for sleep consult January 25, 2023 for snoring found to have mild obstructive sleep apnea  Today's virtual visit is a 48-month follow-up for sleep apnea.  Patient was seen last visit after recent home sleep study showing mild sleep apnea.  She was started on CPAP therapy.  Patient says she is trying to wear CPAP each night.  Does feel that pressures are not strong enough at times.  CPAP headgear seems to pull her hair at times.   CPAP download shows excellent compliance with 100% usage.  Daily average usage at 8.5 hours.  Patient is on auto CPAP 5 to 15 cm H2O.  AHI 2.5/hour daily average pressure at 7.7 cm H2O. Using nasal mask .   Past Medical History:  Diagnosis Date   Anemia 03/2010   Arthritis    Complication of anesthesia    Depression    GERD (gastroesophageal reflux disease)    Herniated lumbar intervertebral disc 10/2000   Injections   History of hiatal hernia    Hyperlipidemia    Hypothyroidism    PONV (postoperative nausea and vomiting)    Thyroid disease    Hypothyroidism s/p radioactive ablation   Tremor    Current Outpatient Medications on File Prior to Visit  Medication Sig Dispense Refill   Calcium Carbonate-Vitamin D 600-400 MG-UNIT tablet Take 2 tablets by mouth daily.      FLUoxetine (PROZAC) 20 MG capsule TAKE 2 CAPSULES BY MOUTH DAILY. 180 capsule 1   HYDROcodone-acetaminophen (NORCO/VICODIN) 5-325 MG tablet Take 1 tablet by mouth every 6 (six) hours as needed for moderate pain  (pain score 4-6). Do not take with oxycodone 24 tablet 0   levothyroxine (SYNTHROID) 100 MCG tablet TAKE 1 TABLET EVERY DAY ON EMPTY STOMACHWITH A GLASS OF WATER AT LEAST 30-60 MINBEFORE BREAKFAST 90 tablet 3   methocarbamol (ROBAXIN) 500 MG tablet Take 1 tablet (500 mg total) by mouth every 8 (eight) hours as needed for muscle spasms. 30 tablet 1   Multiple Vitamins-Minerals (ICAPS AREDS 2 PO) Take 1 tablet by mouth in the morning and at bedtime.     pravastatin (PRAVACHOL) 10 MG tablet TAKE 1 TABLET BY MOUTH DAILY 90 tablet 3   primidone (MYSOLINE) 50 MG tablet TAKE 1 TABLET BY MOUTH AT BEDTIME 90 tablet 1   PROLIA 60 MG/ML SOSY injection Inject 60 mg into the skin every 6 (six) months.     traMADol (ULTRAM) 50 MG tablet 1 po q 8 hrs prn pain 30 tablet 0   No current facility-administered medications on file prior to visit.     Observations/Objective: Home sleep study February 08, 2023 Home sleep study AHI 13.3/hour and SpO2 low at 81%   Assessment and Plan: OSA-excellent control and compliance on CPAP  Change to auto CPAP 7-12cmH2O.   Plan  . Patient Instructions  Continue on CPAP At bedtime, wear all night long for at least 6hr or more  Healthy sleep regimen Do not drive if sleepy Follow-up in 6 months and As needed  -Virtual or  In person      Follow Up Instructions:    I discussed the assessment and treatment plan with the patient. The patient was provided an opportunity to ask questions and all were answered. The patient agreed with the plan and demonstrated an understanding of the instructions.   The patient was advised to call back or seek an in-person evaluation if the symptoms worsen or if the condition fails to improve as anticipated.  I provided 20  minutes of non-face-to-face time during this encounter.   Rubye Oaks, NP

## 2023-08-03 DIAGNOSIS — M810 Age-related osteoporosis without current pathological fracture: Secondary | ICD-10-CM | POA: Diagnosis not present

## 2023-08-06 IMAGING — MG MM DIGITAL DIAGNOSTIC UNILAT*R* W/ TOMO W/ CAD
4 series · 4 of 12 positions shown · non-contrast
Comparison: Previous exam(s).

CLINICAL DATA: Screening recall for a possible right breast mass.

EXAM:
DIGITAL DIAGNOSTIC UNILATERAL RIGHT MAMMOGRAM WITH TOMOSYNTHESIS AND
CAD; ULTRASOUND RIGHT BREAST LIMITED
TECHNIQUE: Right digital diagnostic mammography and breast tomosynthesis was
performed. The images were evaluated with computer-aided detection.;
Targeted ultrasound examination of the right breast was performed

[R CC synth-2D]
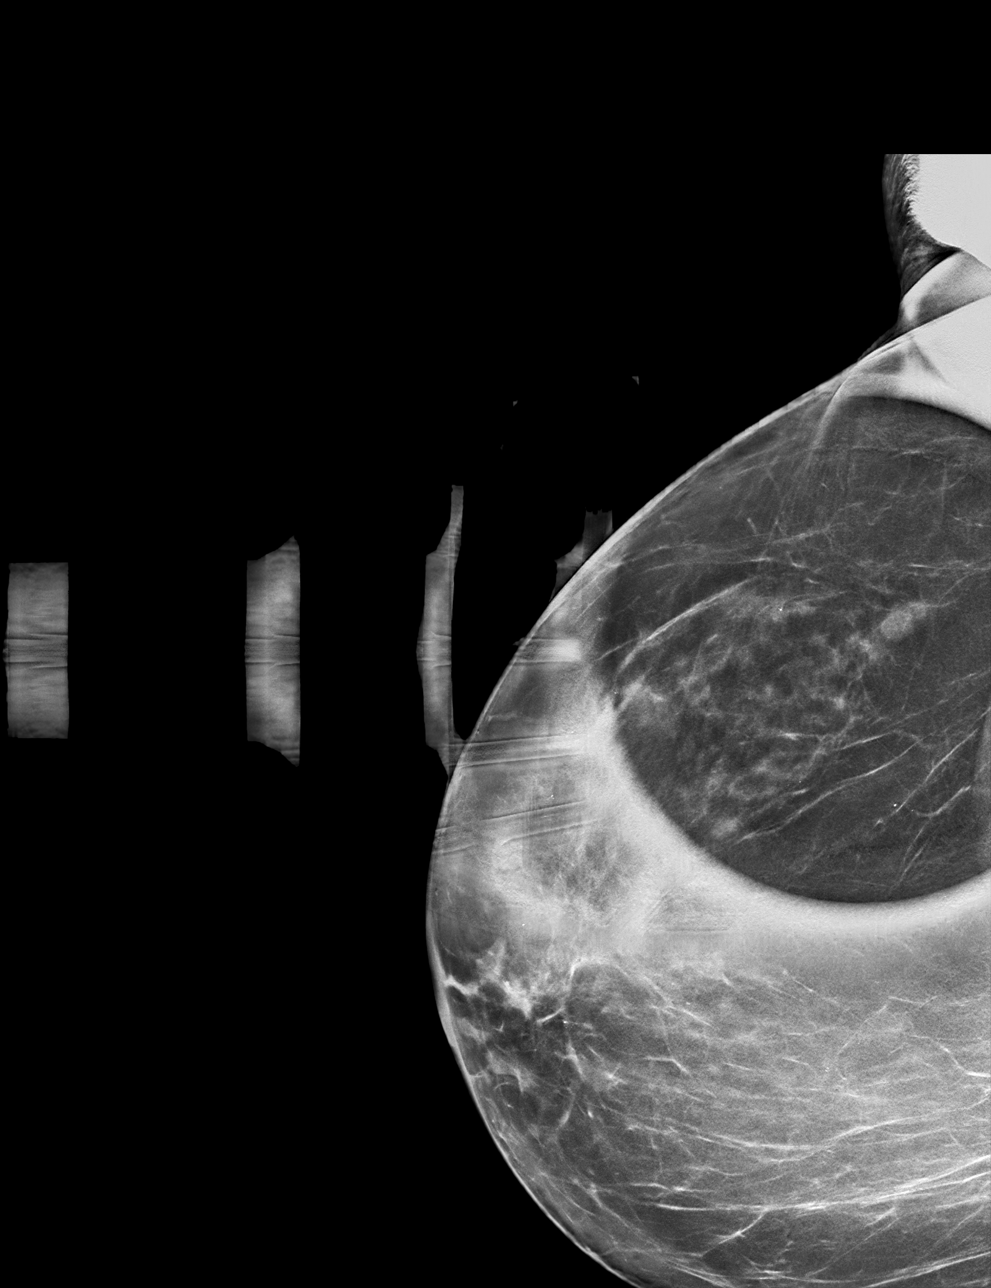

[R MLO synth-2D]
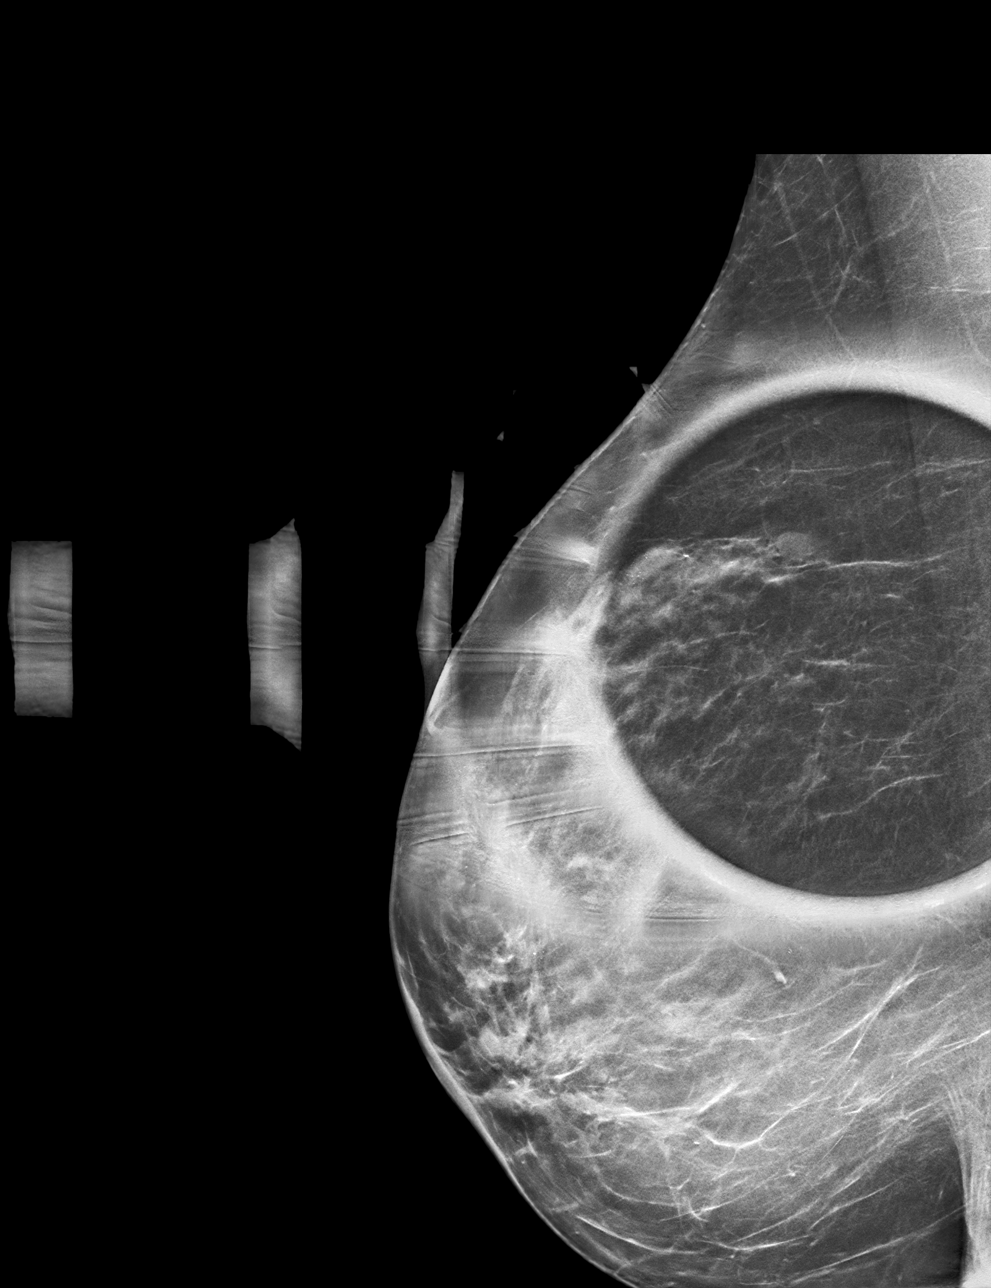

[R MLO tomo · tomo slice 31/60.0]
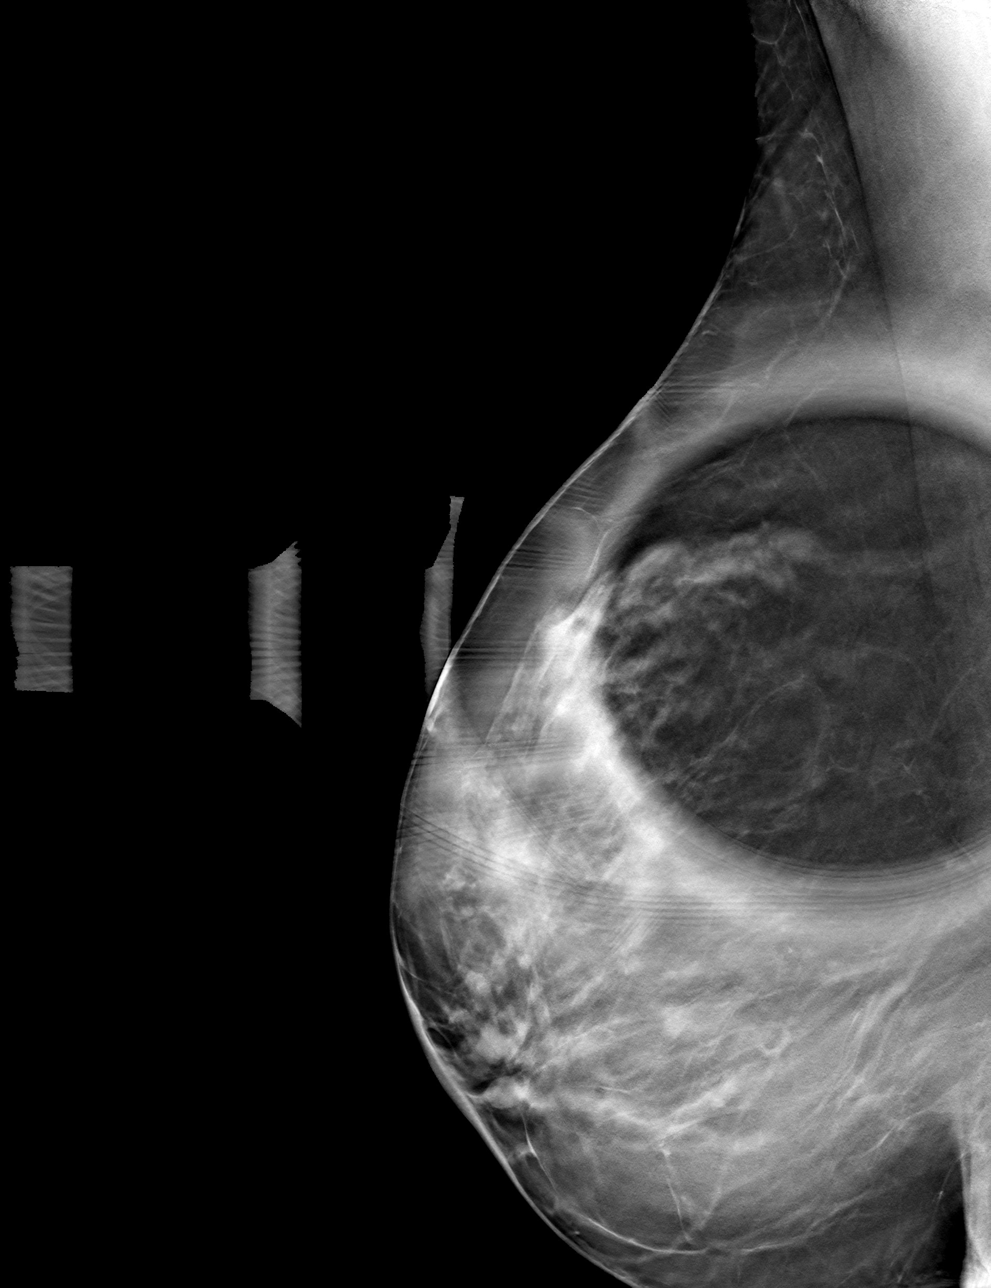

[R CC tomo · tomo slice 33/64.0]
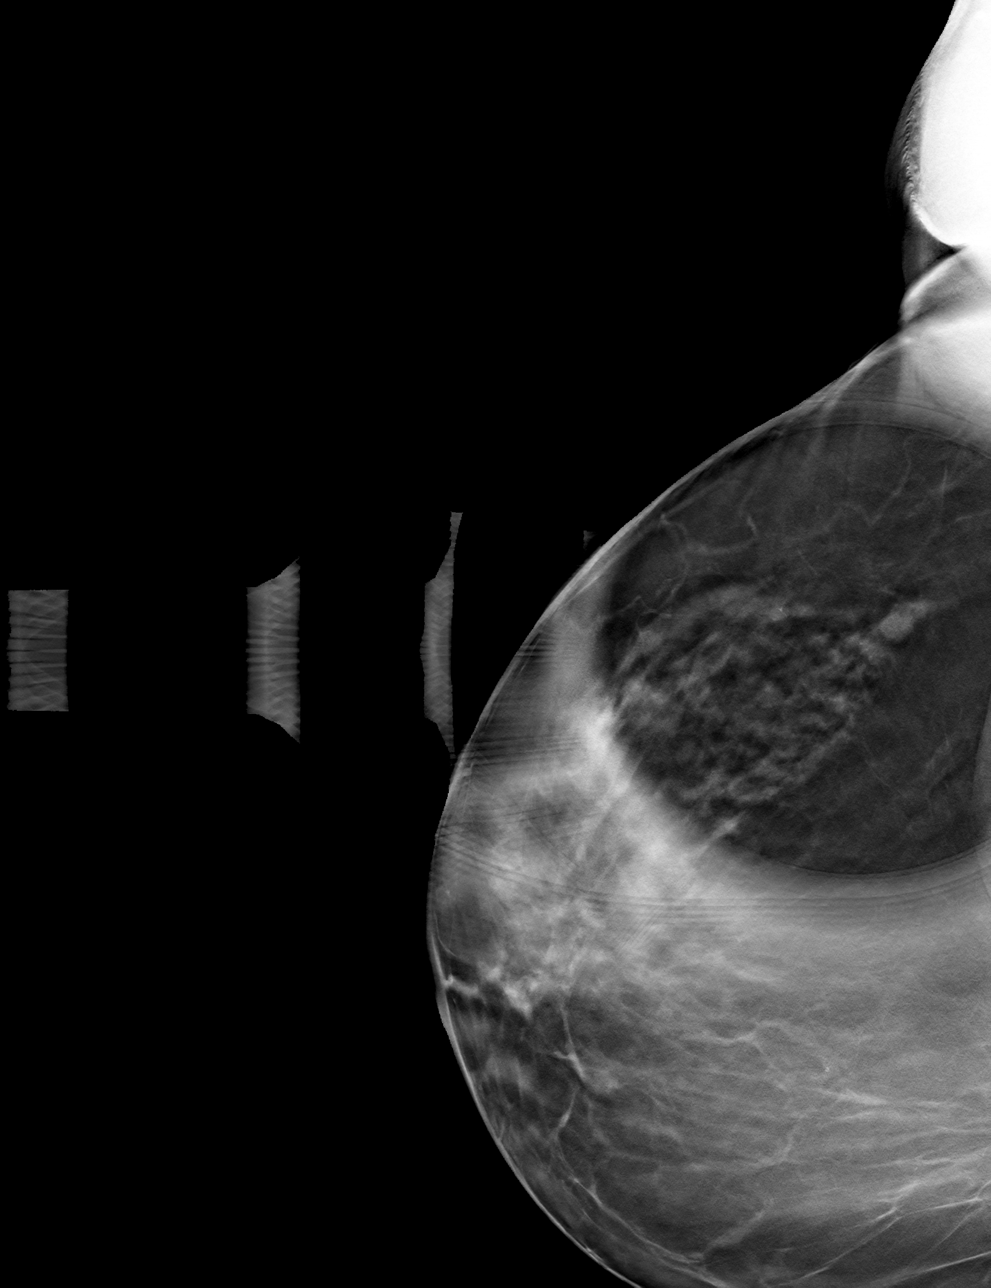

[4 of 12 positions shown; findings below may reference images not displayed]

ACR Breast Density Category b: There are scattered areas of
fibroglandular density.
FINDINGS: Spot compression tomosynthesis images through the upper outer right
breast demonstrates an obscured oval mass.

Ultrasound targeted to the right breast at 10 o'clock, 9 cm from the
nipple demonstrates an anechoic oval circumscribed mass measuring 8
x 3 x 7 mm.
IMPRESSION: The mass in the upper-outer quadrant of the right breast corresponds
with a benign cyst.

RECOMMENDATION:
Screening mammogram in one year.(Code:OG-5-0HC)

I have discussed the findings and recommendations with the patient.
If applicable, a reminder letter will be sent to the patient
regarding the next appointment.

BI-RADS CATEGORY  2: Benign.

## 2023-08-06 IMAGING — US US BREAST*R* LIMITED INC AXILLA
1 series · 6 of 6 positions shown · non-contrast
Comparison: Previous exam(s).

CLINICAL DATA: Screening recall for a possible right breast mass.

EXAM:
DIGITAL DIAGNOSTIC UNILATERAL RIGHT MAMMOGRAM WITH TOMOSYNTHESIS AND
CAD; ULTRASOUND RIGHT BREAST LIMITED
TECHNIQUE: Right digital diagnostic mammography and breast tomosynthesis was
performed. The images were evaluated with computer-aided detection.;
Targeted ultrasound examination of the right breast was performed

[Series 1: us breast*right* limited inc axilla · 0.06mm/px · 6 of 6 slices shown]
[im 1/6]
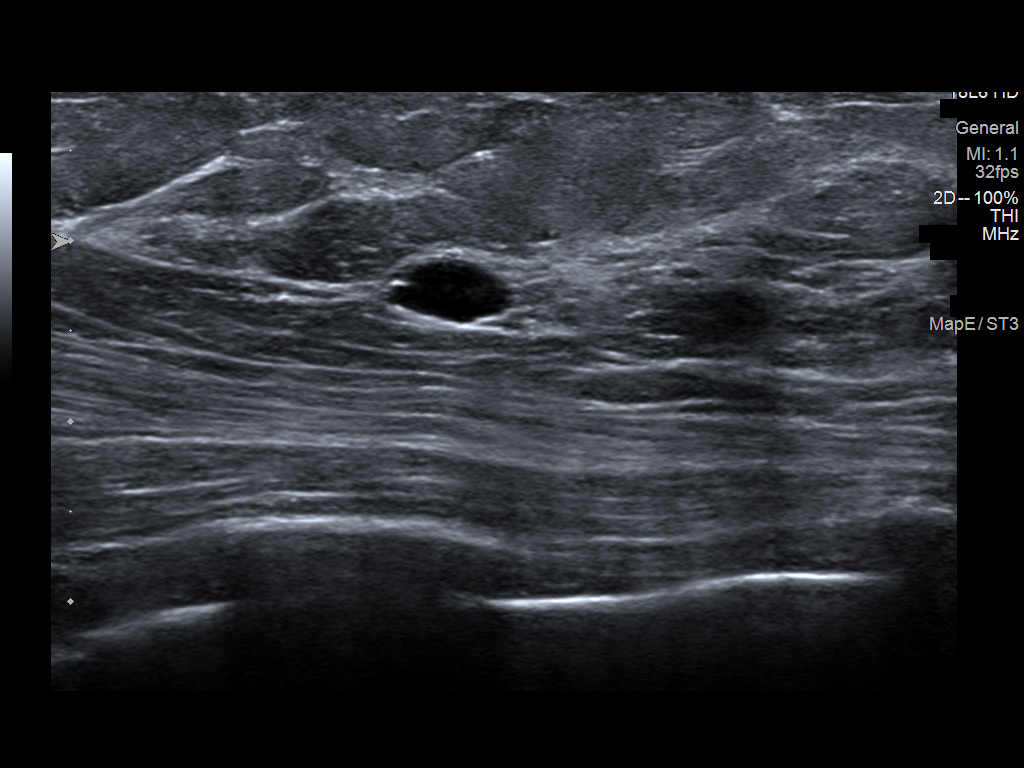
[im 2/6]
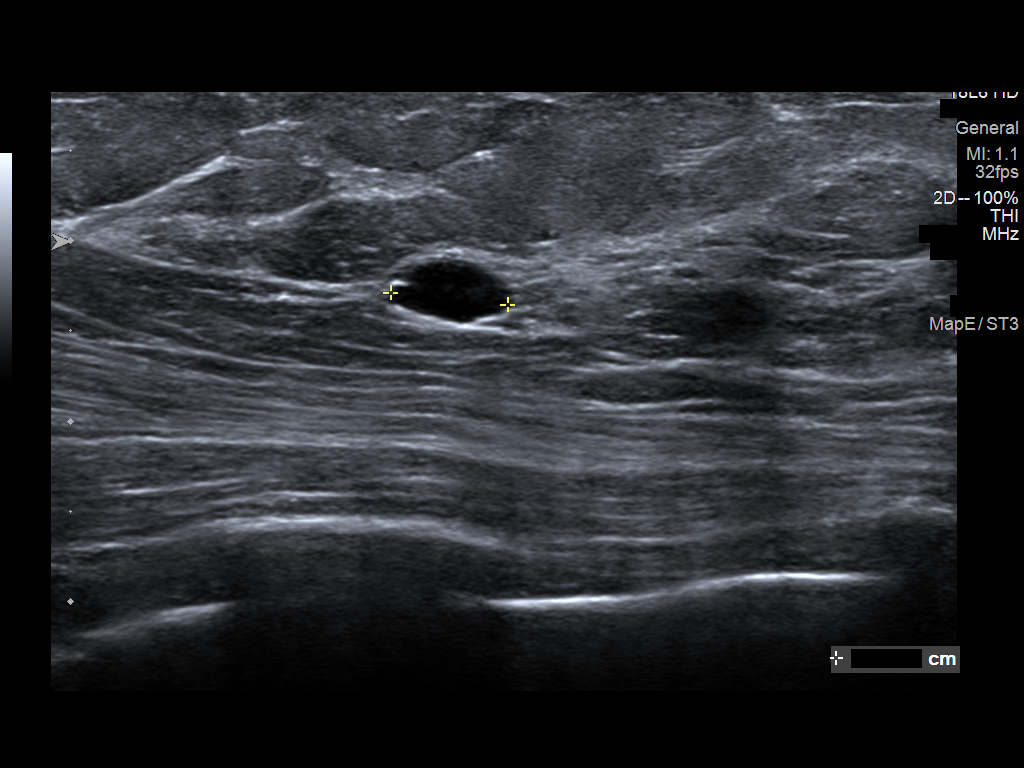
[im 3/6]
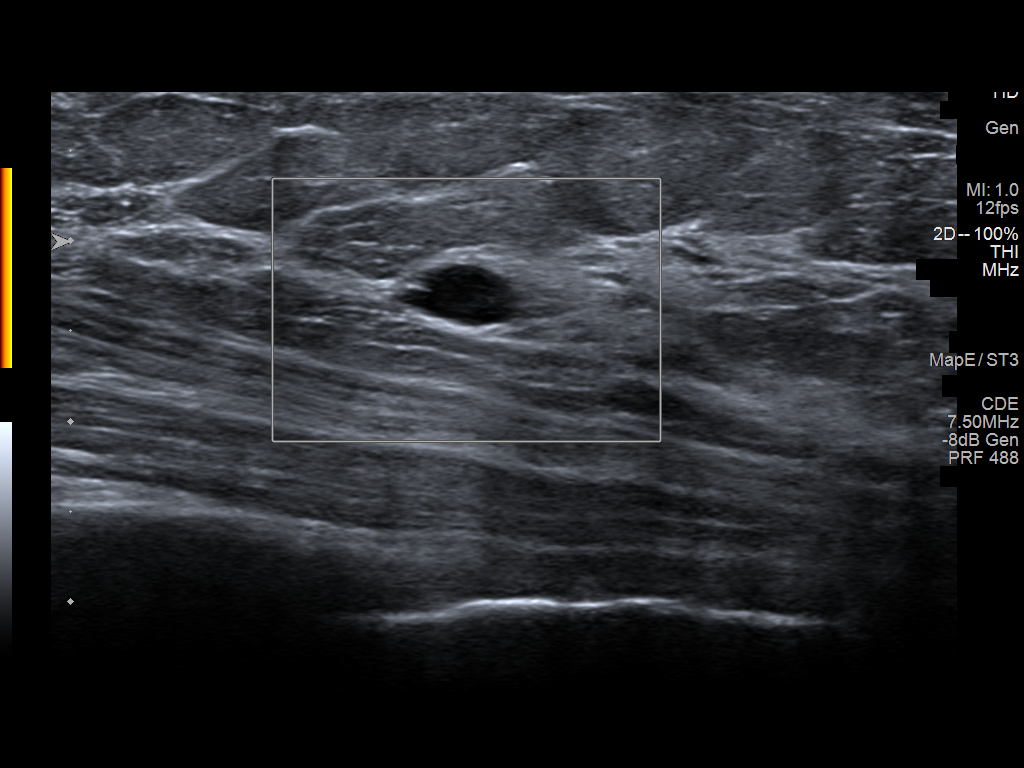
[im 4/6]
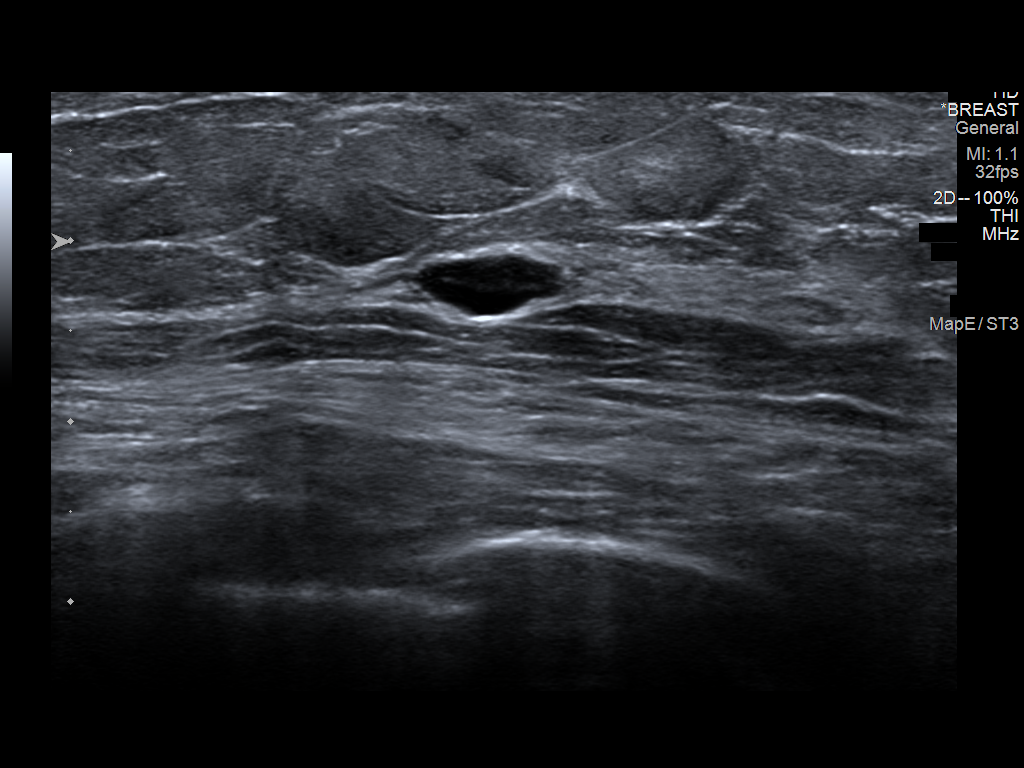
[im 5/6]
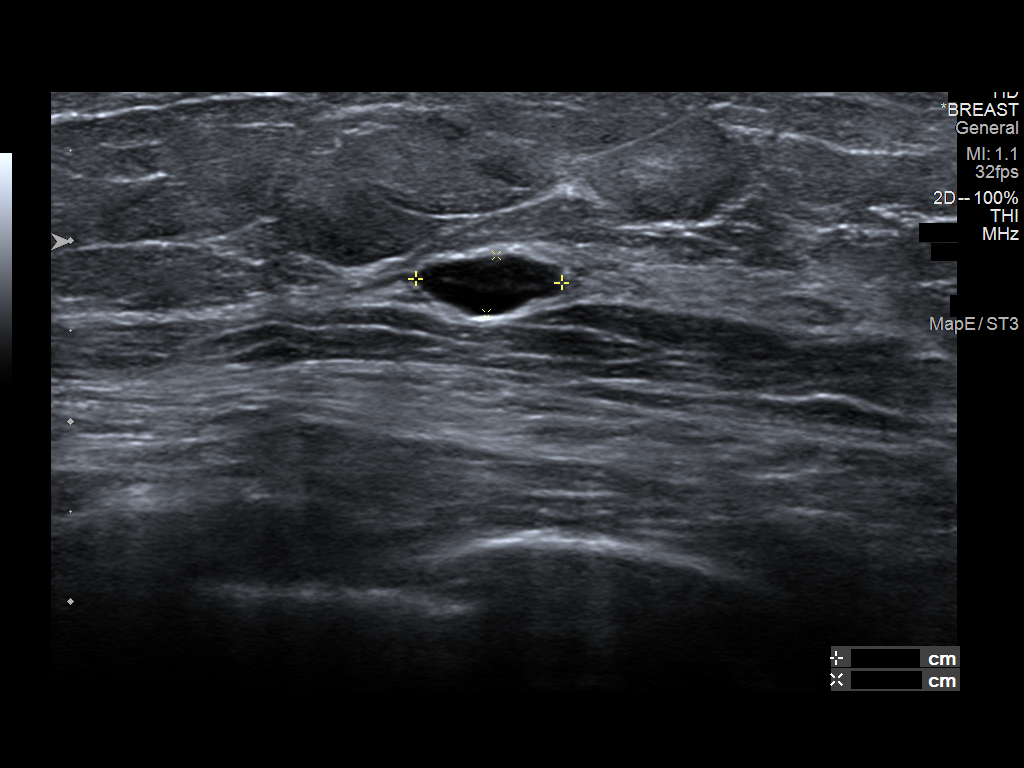
[im 6/6]
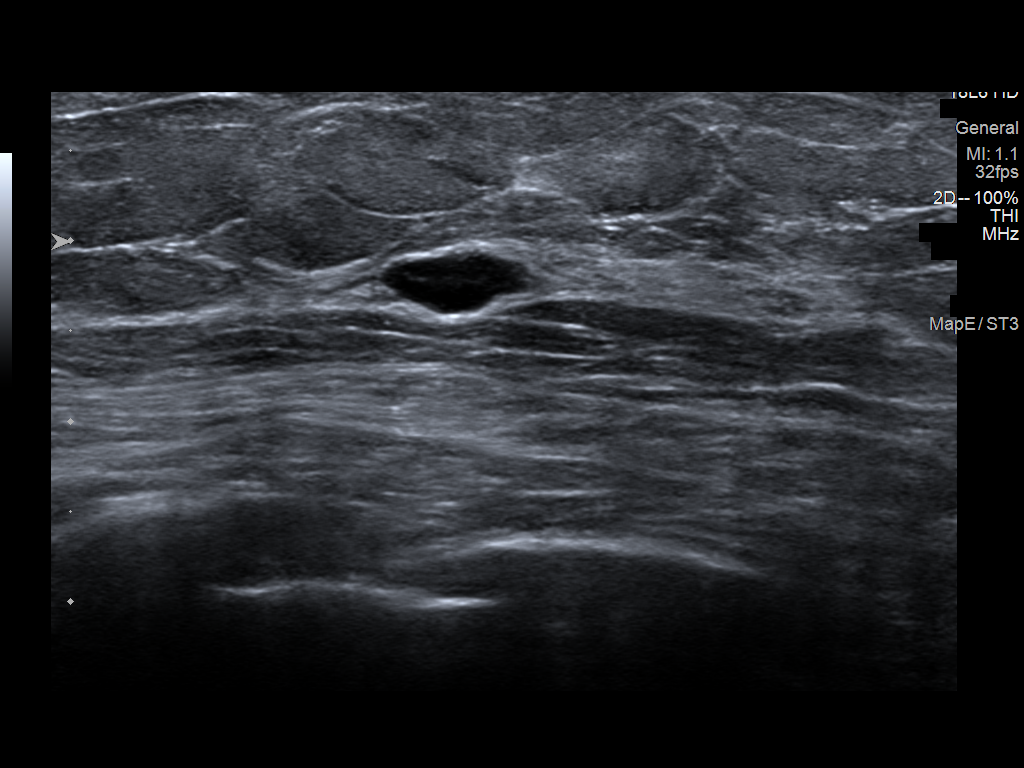

[6 of 6 positions shown; findings below may reference images not displayed]

ACR Breast Density Category b: There are scattered areas of
fibroglandular density.
FINDINGS: Spot compression tomosynthesis images through the upper outer right
breast demonstrates an obscured oval mass.

Ultrasound targeted to the right breast at 10 o'clock, 9 cm from the
nipple demonstrates an anechoic oval circumscribed mass measuring 8
x 3 x 7 mm.
IMPRESSION: The mass in the upper-outer quadrant of the right breast corresponds
with a benign cyst.

RECOMMENDATION:
Screening mammogram in one year.(Code:OG-5-0HC)

I have discussed the findings and recommendations with the patient.
If applicable, a reminder letter will be sent to the patient
regarding the next appointment.

BI-RADS CATEGORY  2: Benign.

## 2023-08-14 DIAGNOSIS — G4709 Other insomnia: Secondary | ICD-10-CM | POA: Diagnosis not present

## 2023-08-14 DIAGNOSIS — G4733 Obstructive sleep apnea (adult) (pediatric): Secondary | ICD-10-CM | POA: Diagnosis not present

## 2023-08-23 ENCOUNTER — Encounter: Payer: Self-pay | Admitting: Family Medicine

## 2023-08-25 ENCOUNTER — Encounter: Payer: PPO | Admitting: Orthopedic Surgery

## 2023-08-25 ENCOUNTER — Other Ambulatory Visit: Payer: Self-pay | Admitting: Family Medicine

## 2023-08-25 ENCOUNTER — Telehealth: Payer: Self-pay | Admitting: Orthopedic Surgery

## 2023-08-25 MED ORDER — FLUOXETINE HCL 20 MG PO CAPS: 60.0000 mg | ORAL_CAPSULE | Freq: Every day | ORAL | 0 refills | Status: DC

## 2023-08-25 NOTE — Telephone Encounter (Signed)
 Patient called and said she needs to be rescheduled because she has a fever and sick. CB#[phone]

## 2023-08-25 NOTE — Telephone Encounter (Signed)
 See MyChart message.  I sent the prescription for the increased dose of fluoxetine but please check with patient about her status/mood and let me know.  Thanks.

## 2023-08-25 NOTE — Telephone Encounter (Signed)
 rescheduled

## 2023-08-26 ENCOUNTER — Other Ambulatory Visit: Payer: Self-pay | Admitting: Family Medicine

## 2023-08-26 NOTE — Telephone Encounter (Signed)
 Williams, Abigail Marks, CMA to Me  08/26/23  4:56 PM Spoke with patient states that she has not been on the increase to notice how she is doing. She hasn't been on the medication long enough to tell if its making a difference but overall she is doing good.  ============= Noted. Thanks.

## 2023-09-01 ENCOUNTER — Ambulatory Visit: Payer: Medicare HMO | Admitting: Orthopedic Surgery

## 2023-09-01 ENCOUNTER — Encounter: Payer: Self-pay | Admitting: Orthopedic Surgery

## 2023-09-01 DIAGNOSIS — Z9889 Other specified postprocedural states: Secondary | ICD-10-CM

## 2023-09-01 NOTE — Progress Notes (Signed)
 Post-Op Visit Note   Patient: Abigail Marks           Date of Birth: 04-Jul-1956           MRN: [id number] Visit Date: 09/01/2023 PCP: Donnie Galea, MD   Assessment & Plan:  Chief Complaint:  Chief Complaint  Patient presents with   Left Knee - Routine Post Op, Follow-up    left knee arthroscopy and debridement on 05/31/2023.   Visit Diagnoses: No diagnosis found.  Plan: Abigail Marks is a patient is 3 months out now from left knee arthroscopy.  Overall doing well.  Stairs are better going up and down.  Still has some swelling but overall is improved.  On examination she has full extension and full flexion.  Mild effusion present.  No real focal joint line tenderness.  Quad strength and tone excellent in both legs.  Plan at this time is to continue with nonweightbearing quad strengthening exercises.  I think she can follow-up in 2 to 3 months if she needs aspiration of the knee.  Her workout regimen is very well-suited at this time to her rehabilitative stage.  Follow-Up Instructions: No follow-ups on file.   Orders:  No orders of the defined types were placed in this encounter.  No orders of the defined types were placed in this encounter.   Imaging: No results found.  PMFS History: Patient Active Problem List   Diagnosis Date Noted   OSA (obstructive sleep apnea) 05/21/2023   Snoring 01/21/2023   Tinnitus 01/14/2022   Healthcare maintenance 01/14/2022   Macular degeneration 01/13/2022   Skin lesion 02/26/2021   H/O: hysterectomy 08/29/2020   At high risk for breast cancer 08/29/2020   Osteoporosis 08/29/2020   Dizziness 01/27/2017   Hyperglycemia 10/19/2016   GERD (gastroesophageal reflux disease) 07/06/2014   Advance care planning 04/05/2014   Medicare welcome exam 12/25/2012   Elevated blood pressure reading without diagnosis of hypertension 01/01/2012   Tremor 03/25/2011   UNSPECIFIED PTOSIS OF EYELID 09/28/2007   Hypothyroidism 02/03/2007   HLD  (hyperlipidemia) 02/03/2007   Depression 02/03/2007   IBS 02/03/2007   INSOMNIA 02/03/2007   Past Medical History:  Diagnosis Date   Anemia 03/2010   Arthritis    Complication of anesthesia    Depression    GERD (gastroesophageal reflux disease)    Herniated lumbar intervertebral disc 10/2000   Injections   History of hiatal hernia    Hyperlipidemia    Hypothyroidism    PONV (postoperative nausea and vomiting)    Thyroid  disease    Hypothyroidism s/p radioactive ablation   Tremor     Family History  Problem Relation Age of Onset   Diabetes Mother    Hypertension Mother    Cancer Mother        breast   Heart disease Mother        MI   Breast cancer Mother    Cancer Sister        Breast   Breast cancer Sister    Heart disease Sister    Healthy Daughter    Hypertension Son    Kidney disease Son    Colon cancer Neg Hx     Past Surgical History:  Procedure Laterality Date   ABDOMINAL HYSTERECTOMY     Partial,prolapse   BLADDER SUSPENSION  03/17/2005   CATARACT EXTRACTION W/PHACO Right 11/11/2015   Procedure: CATARACT EXTRACTION PHACO AND INTRAOCULAR LENS PLACEMENT (IOC);  Surgeon: Steven Dingeldein, MD;  Location: [facility] ORS;  Service: Ophthalmology;  Laterality: Right;  [place]  00:55 AP% 23.0 CDE 22.84 fluid pack lot # [id number]   CATARACT EXTRACTION W/PHACO Left 11/01/2015   Procedure: CATARACT EXTRACTION PHACO AND INTRAOCULAR LENS PLACEMENT (IOC);  Surgeon: Steven Dingeldein, MD;  Location: [facility] ORS;  Service: Ophthalmology;  Laterality: Left;  [place]             1:05 AP%        23 CDE       28.79       fluid casette lot # [id number]  exp5/31/2018   CHOLECYSTECTOMY     COLONOSCOPY WITH PROPOFOL  N/A 02/13/2022   Procedure: COLONOSCOPY WITH PROPOFOL ;  Surgeon: Luke Salaam, MD;  Location: [facility] ENDOSCOPY;  Service: Gastroenterology;  Laterality: N/A;   LIPOMA EXCISION     OOPHORECTOMY     bilateral cysts   Social History   Occupational History   Occupation: Research officer, political party at  [employer]  Tobacco Use   Smoking status: Former    Current packs/day: 0.00    Types: Cigarettes    Quit date: 08/17/2004    Years since quitting: 19.0    Passive exposure: Never   Smokeless tobacco: Never   Tobacco comments:    Social smoker.  May not even smoke a pack per week.  01/25/2023 hfb  Vaping Use   Vaping status: Never Used  Substance and Sexual Activity   Alcohol use: Not Currently   Drug use: No   Sexual activity: Not on file

## 2023-11-19 ENCOUNTER — Telehealth: Admitting: Adult Health

## 2023-11-19 ENCOUNTER — Encounter: Payer: Self-pay | Admitting: Adult Health

## 2023-11-19 DIAGNOSIS — G4733 Obstructive sleep apnea (adult) (pediatric): Secondary | ICD-10-CM

## 2023-11-19 NOTE — Progress Notes (Signed)
 Virtual Visit via Video Note  I connected with Abigail Marks on 11/19/23 at  2:30 PM EDT by a video enabled telemedicine application and verified that I am speaking with the correct person using two identifiers.  Location: Patient: Home  Provider: Office    I discussed the limitations of evaluation and management by telemedicine and the availability of in person appointments. The patient expressed understanding and agreed to proceed.  History of Present Illness: 68 year old female seen for sleep consult June 2024 for snoring found to have mild obstructive sleep apnea.  Today's video visit is a 34-month follow-up.  Patient has mild obstructive sleep apnea.  She was started on CPAP therapy.    Feels that she is benefiting from CPAP but not as much as she did when she first started using it..  Last visit was having some pressure issues.  Patient's CPAP was changed to CPAP auto 7 to 12 cm H2O.  Since last visit patient feels okay but still is tired and feels pressure is too low .  Does not feel that the pressure is strong enough. CPAP download shows 80% compliance.  Daily average usage at 8 hours.  Patient is on auto CPAP 7 to 12 cm H2O.  AHI 3.8/hour.  Minimum leaks. She is using a nasal mask.  DME company is [employer]  Past Medical History:  Diagnosis Date   Anemia 03/2010   Arthritis    Complication of anesthesia    Depression    GERD (gastroesophageal reflux disease)    Herniated lumbar intervertebral disc 10/2000   Injections   History of hiatal hernia    Hyperlipidemia    Hypothyroidism    PONV (postoperative nausea and vomiting)    Thyroid disease    Hypothyroidism s/p radioactive ablation   Tremor     Current Outpatient Medications on File Prior to Visit  Medication Sig Dispense Refill   Calcium Carbonate-Vitamin D 600-400 MG-UNIT tablet Take 2 tablets by mouth daily.      FLUoxetine (PROZAC) 20 MG capsule Take 3 capsules (60 mg total) by mouth daily. 270 capsule 0    HYDROcodone-acetaminophen (NORCO/VICODIN) 5-325 MG tablet Take 1 tablet by mouth every 6 (six) hours as needed for moderate pain (pain score 4-6). Do not take with oxycodone 24 tablet 0   levothyroxine (SYNTHROID) 100 MCG tablet TAKE 1 TABLET EVERY DAY ON EMPTY STOMACHWITH A GLASS OF WATER AT LEAST 30-60 MINBEFORE BREAKFAST 90 tablet 3   methocarbamol (ROBAXIN) 500 MG tablet Take 1 tablet (500 mg total) by mouth every 8 (eight) hours as needed for muscle spasms. 30 tablet 1   Multiple Vitamins-Minerals (ICAPS AREDS 2 PO) Take 1 tablet by mouth in the morning and at bedtime.     pravastatin (PRAVACHOL) 10 MG tablet TAKE 1 TABLET BY MOUTH DAILY 90 tablet 3   primidone (MYSOLINE) 50 MG tablet TAKE 1 TABLET BY MOUTH AT BEDTIME 90 tablet 1   PROLIA 60 MG/ML SOSY injection Inject 60 mg into the skin every 6 (six) months.     traMADol (ULTRAM) 50 MG tablet 1 po q 8 hrs prn pain 30 tablet 0   No current facility-administered medications on file prior to visit.    Observations/Objective: 11/19/2023 Appears well in NAD   Assessment and Plan: Moderate struct of sleep apnea.  Patient has significant symptom burden with daytime sleepiness.  Feels that CPAP pressure is not high enough.  Will adjust CPAP auto to 10 to 15 cm H2O.  Download  in 1 month  Plan  Patient Instructions  Continue on CPAP At bedtime, wear all night long for at least 6hr or more  CPAP pressure changed to auto CPAP 10 to 15 cm H2O. CPAP download in 1 month Healthy sleep regimen Do not drive if sleepy Follow-up in 4 months and As needed  -Virtual or In person     Follow Up Instructions:    I discussed the assessment and treatment plan with the patient. The patient was provided an opportunity to ask questions and all were answered. The patient agreed with the plan and demonstrated an understanding of the instructions.   The patient was advised to call back or seek an in-person evaluation if the symptoms worsen or if the condition  fails to improve as anticipated.  I provided 21 minutes of non-face-to-face time during this encounter.   Rubye Oaks, NP

## 2023-11-19 NOTE — Patient Instructions (Addendum)
 Continue on CPAP At bedtime, wear all night long for at least 6hr or more  CPAP pressure changed to auto CPAP 10 to 15 cm H2O. CPAP download in 1 month Healthy sleep regimen Do not drive if sleepy Follow-up in 4 months and As needed  -Virtual or In person

## 2023-11-26 ENCOUNTER — Other Ambulatory Visit: Payer: Self-pay | Admitting: Family Medicine

## 2023-12-01 NOTE — Telephone Encounter (Signed)
 LOV: 01/18/2023 NOV: Nothing scheduled Last refill: FLUoxetine (PROZAC) 20 MG capsule  08/25/23 270 capsules 0 refills

## 2023-12-02 NOTE — Telephone Encounter (Signed)
 ERx

## 2023-12-04 ENCOUNTER — Other Ambulatory Visit: Payer: Self-pay | Admitting: Family Medicine

## 2023-12-23 DIAGNOSIS — Z124 Encounter for screening for malignant neoplasm of cervix: Secondary | ICD-10-CM | POA: Diagnosis not present

## 2023-12-23 DIAGNOSIS — Z1231 Encounter for screening mammogram for malignant neoplasm of breast: Secondary | ICD-10-CM | POA: Diagnosis not present

## 2023-12-23 DIAGNOSIS — E7800 Pure hypercholesterolemia, unspecified: Secondary | ICD-10-CM | POA: Diagnosis not present

## 2023-12-23 DIAGNOSIS — Z01419 Encounter for gynecological examination (general) (routine) without abnormal findings: Secondary | ICD-10-CM | POA: Diagnosis not present

## 2023-12-23 DIAGNOSIS — Z87411 Personal history of vaginal dysplasia: Secondary | ICD-10-CM | POA: Diagnosis not present

## 2023-12-23 DIAGNOSIS — Z13220 Encounter for screening for lipoid disorders: Secondary | ICD-10-CM | POA: Diagnosis not present

## 2023-12-23 DIAGNOSIS — Z13228 Encounter for screening for other metabolic disorders: Secondary | ICD-10-CM | POA: Diagnosis not present

## 2023-12-23 DIAGNOSIS — Z1329 Encounter for screening for other suspected endocrine disorder: Secondary | ICD-10-CM | POA: Diagnosis not present

## 2023-12-23 DIAGNOSIS — M810 Age-related osteoporosis without current pathological fracture: Secondary | ICD-10-CM | POA: Diagnosis not present

## 2023-12-23 DIAGNOSIS — Z130 Encounter for screening for diseases of the blood and blood-forming organs and certain disorders involving the immune mechanism: Secondary | ICD-10-CM | POA: Diagnosis not present

## 2023-12-23 DIAGNOSIS — Z6825 Body mass index (BMI) 25.0-25.9, adult: Secondary | ICD-10-CM | POA: Diagnosis not present

## 2023-12-23 DIAGNOSIS — N952 Postmenopausal atrophic vaginitis: Secondary | ICD-10-CM | POA: Diagnosis not present

## 2023-12-23 DIAGNOSIS — E039 Hypothyroidism, unspecified: Secondary | ICD-10-CM | POA: Diagnosis not present

## 2023-12-23 DIAGNOSIS — Z1321 Encounter for screening for nutritional disorder: Secondary | ICD-10-CM | POA: Diagnosis not present

## 2023-12-23 DIAGNOSIS — Z131 Encounter for screening for diabetes mellitus: Secondary | ICD-10-CM | POA: Diagnosis not present

## 2023-12-23 LAB — LIPID PANEL
Cholesterol: 237 — AB (ref 0–200)
HDL: 56 (ref 35–70)
LDL Cholesterol: 156
Triglycerides: 138 (ref 40–160)

## 2023-12-23 LAB — HEPATIC FUNCTION PANEL
ALT: 23 U/L (ref 7–35)
AST: 22 (ref 13–35)

## 2023-12-23 LAB — BASIC METABOLIC PANEL WITH GFR
Creatinine: 0.7 (ref 0.5–1.1)
Glucose: 86

## 2023-12-23 LAB — CBC AND DIFFERENTIAL
Hemoglobin: 12.1 (ref 12.0–16.0)
Platelets: 271 10*3/uL (ref 150–400)
WBC: 7.1

## 2023-12-23 LAB — TSH: TSH: 10.9 — AB (ref 0.41–5.90)

## 2023-12-23 LAB — HEMOGLOBIN A1C: Hemoglobin A1C: 4.9

## 2023-12-23 LAB — VITAMIN D 25 HYDROXY (VIT D DEFICIENCY, FRACTURES): Vit D, 25-Hydroxy: 54.3

## 2023-12-24 ENCOUNTER — Encounter: Payer: Self-pay | Admitting: Family Medicine

## 2023-12-24 NOTE — Telephone Encounter (Signed)
 I have printed this and placed them in the tray with that stacks that says already in EPIC.

## 2023-12-24 NOTE — Telephone Encounter (Unsigned)
 Copied from CRM [id number]. Topic: Clinical - Lab/Test Results >> Dec 24, 2023 11:27 AM Clydene Darner H wrote: Reason for CRM: Patient called this morning to inquire whether Dr. Vallarie Gauze has received her lab results from yesterday. I informed that the nurse has printed the results and provided them to Dr. Vallarie Gauze for review, as per the nurse's message. Patient verbalized understanding.

## 2023-12-28 ENCOUNTER — Other Ambulatory Visit: Payer: Self-pay | Admitting: Family Medicine

## 2023-12-28 ENCOUNTER — Encounter: Payer: Self-pay | Admitting: Family Medicine

## 2023-12-28 DIAGNOSIS — E039 Hypothyroidism, unspecified: Secondary | ICD-10-CM

## 2023-12-28 DIAGNOSIS — E785 Hyperlipidemia, unspecified: Secondary | ICD-10-CM

## 2023-12-28 LAB — THYROXINE (T4) FREE, DIRECT: T4,Free (Direct): 1.09

## 2023-12-28 MED ORDER — LEVOTHYROXINE SODIUM 112 MCG PO TABS: 112.0000 ug | ORAL_TABLET | Freq: Every day | ORAL | 3 refills | Status: DC

## 2023-12-28 MED ORDER — PRAVASTATIN SODIUM 10 MG PO TABS: 20.0000 mg | ORAL_TABLET | Freq: Every day | ORAL | Status: DC

## 2023-12-28 NOTE — Telephone Encounter (Signed)
 Patient is needing a call back regarding her labs today

## 2024-01-05 ENCOUNTER — Ambulatory Visit: Admitting: Orthopedic Surgery

## 2024-01-07 ENCOUNTER — Encounter: Payer: Self-pay | Admitting: Family Medicine

## 2024-01-10 ENCOUNTER — Other Ambulatory Visit: Payer: Self-pay | Admitting: Family Medicine

## 2024-01-10 MED ORDER — LORATADINE 10 MG PO TABS: 10.0000 mg | ORAL_TABLET | Freq: Every day | ORAL | Status: AC

## 2024-02-25 ENCOUNTER — Other Ambulatory Visit: Payer: Self-pay | Admitting: Family Medicine

## 2024-02-29 ENCOUNTER — Other Ambulatory Visit: Payer: Self-pay | Admitting: Family Medicine

## 2024-03-01 NOTE — Telephone Encounter (Signed)
 LOV: 01/18/23 NOV: NOTHING SCHEDULED Last Refill: FLUoxetine  (PROZAC ) 20 MG capsule  12/02/23 270 CAPSULES 0 REFILLS

## 2024-03-16 ENCOUNTER — Ambulatory Visit

## 2024-03-16 VITALS — Ht 63.0 in | Wt 141.0 lb

## 2024-03-16 DIAGNOSIS — Z0000 Encounter for general adult medical examination without abnormal findings: Secondary | ICD-10-CM

## 2024-03-16 NOTE — Progress Notes (Signed)
 Subjective:   Abigail Marks is a 68 y.o. who presents for a Medicare Wellness preventive visit.  As a reminder, Annual Wellness Visits don't include a physical exam, and some assessments may be limited, especially if this visit is performed virtually. We may recommend an in-person follow-up visit with your provider if needed.  Visit Complete: Virtual I connected with  Abigail Marks on 03/16/24 by a audio enabled telemedicine application and verified that I am speaking with the correct person using two identifiers.  Patient Location: Home  Provider Location: Office/Clinic  I discussed the limitations of evaluation and management by telemedicine. The patient expressed understanding and agreed to proceed.  Vital Signs: Because this visit was a virtual/telehealth visit, some criteria may be missing or patient reported. Any vitals not documented were not able to be obtained and vitals that have been documented are patient reported.  VideoDeclined- This patient declined Librarian, academic. Therefore the visit was completed with audio only.  Persons Participating in Visit: Patient.  AWV Questionnaire: Yes: Patient Medicare AWV questionnaire was completed by the patient on 03/12/24; I have confirmed that all information answered by patient is correct and no changes since this date.  Cardiac Risk Factors include: advanced age (>20men, >11 women);dyslipidemia     Objective:    Today's Vitals   03/16/24 1541  Weight: 141 lb (64 kg)  Height: 5' 3 (1.6 m)   Body mass index is 24.98 kg/m.     03/16/2024    3:49 PM 01/14/2023    1:10 PM 02/13/2022    9:45 AM 12/31/2021    1:08 PM 02/27/2020    6:10 PM 11/15/2019    9:20 AM 03/15/2019   10:52 AM  Advanced Directives  Does Patient Have a Medical Advance Directive? Yes Yes Yes No Yes Yes Yes  Type of Estate agent of [place];Living will Healthcare Power of [place];Living will Living  will  Healthcare Power of [place];Living will Healthcare Power of [place];Living will Healthcare Power of [place];Living will  Copy of Healthcare Power of Attorney in Chart? No - copy requested No - copy requested     No - copy requested   Would patient like information on creating a medical advance directive?    No - Patient declined No - Patient declined       Data saved with a previous flowsheet row definition    Current Medications (verified) Outpatient Encounter Medications as of 03/16/2024  Medication Sig   Calcium Carbonate-Vitamin D  600-400 MG-UNIT tablet Take 2 tablets by mouth daily.    FLUoxetine  (PROZAC ) 20 MG capsule TAKE 3 CAPSULES BY MOUTH ONCE DAILY   levothyroxine  (SYNTHROID ) 112 MCG tablet Take 1 tablet (112 mcg total) by mouth daily before breakfast.   loratadine  (CLARITIN ) 10 MG tablet Take 1 tablet (10 mg total) by mouth daily.   Multiple Vitamins-Minerals (ICAPS AREDS 2 PO) Take 1 tablet by mouth in the morning and at bedtime.   pravastatin  (PRAVACHOL ) 10 MG tablet TAKE 1 TABLET BY MOUTH DAILY   primidone  (MYSOLINE ) 50 MG tablet TAKE 1 TABLET BY MOUTH AT BEDTIME   PROLIA 60 MG/ML SOSY injection Inject 60 mg into the skin every 6 (six) months.   HYDROcodone -acetaminophen  (NORCO/VICODIN) 5-325 MG tablet Take 1 tablet by mouth every 6 (six) hours as needed for moderate pain (pain score 4-6). Do not take with oxycodone    methocarbamol  (ROBAXIN ) 500 MG tablet Take 1 tablet (500 mg total) by mouth every 8 (eight) hours  as needed for muscle spasms.   traMADol  (ULTRAM ) 50 MG tablet 1 po q 8 hrs prn pain   No facility-administered encounter medications on file as of 03/16/2024.    Allergies (verified) Atorvastatin, Bupropion hcl, Buspar  [buspirone ], Ezetimibe-simvastatin , Lunesta  [eszopiclone ], Mirtazapine, Molnupiravir , Nsaids, Omeprazole-sodium bicarbonate, Paroxetine, Raloxifene, Rosuvastatin, Temazepam , Trazodone and nefazodone, and Zolpidem tartrate   History: Past  Medical History:  Diagnosis Date   Allergy    Anemia 03/2010   Anxiety    Arthritis    Complication of anesthesia    Depression    GERD (gastroesophageal reflux disease)    Herniated lumbar intervertebral disc 10/2000   Injections   History of hiatal hernia    Hyperlipidemia    Hypothyroidism    PONV (postoperative nausea and vomiting)    Sleep apnea    Thyroid  disease    Hypothyroidism s/p radioactive ablation   Tremor    Past Surgical History:  Procedure Laterality Date   ABDOMINAL HYSTERECTOMY     Partial,prolapse   BLADDER SUSPENSION  03/17/2005   CATARACT EXTRACTION W/PHACO Right 11/11/2015   Procedure: CATARACT EXTRACTION PHACO AND INTRAOCULAR LENS PLACEMENT (IOC);  Surgeon: Steven Dingeldein, MD;  Location: [facility] ORS;  Service: Ophthalmology;  Laterality: Right;  US  00:55 AP% 23.0 CDE 22.84 fluid pack lot # [id number]   CATARACT EXTRACTION W/PHACO Left 11/01/2015   Procedure: CATARACT EXTRACTION PHACO AND INTRAOCULAR LENS PLACEMENT (IOC);  Surgeon: Steven Dingeldein, MD;  Location: [facility] ORS;  Service: Ophthalmology;  Laterality: Left;  US             1:05 AP%        23 CDE       28.79       fluid casette lot # [id number]  exp5/31/2018   CHOLECYSTECTOMY     COLONOSCOPY WITH PROPOFOL  N/A 02/13/2022   Procedure: COLONOSCOPY WITH PROPOFOL ;  Surgeon: Therisa Bi, MD;  Location: [facility];  Service: Gastroenterology;  Laterality: N/A;   EYE SURGERY  cataracts both eyes   LIPOMA EXCISION     OOPHORECTOMY     bilateral cysts   TUBAL LIGATION     Family History  Problem Relation Age of Onset   Diabetes Mother    Hypertension Mother    Cancer Mother        breast   Heart disease Mother        MI   Breast cancer Mother    COPD Mother    Cancer Sister        Breast   Breast cancer Sister    Heart disease Sister    Healthy Daughter    Hypertension Son    Kidney disease Son    Cancer Son    Colon cancer Neg Hx    Social History   Socioeconomic History    Marital status: Widowed    Spouse name: Not on file   Number of children: 2   Years of education: Not on file   Highest education level: Associate degree: occupational, Scientist, product/process development, or vocational program  Occupational History   Occupation: Research officer, political party at [employer]  Tobacco Use   Smoking status: Former    Current packs/day: 0.00    Types: Cigarettes    Quit date: 08/17/2004    Years since quitting: 19.5    Passive exposure: Never   Smokeless tobacco: Never   Tobacco comments:    Social smoker.  May not even smoke a pack per week.  01/25/2023 hfb  Vaping Use   Vaping  status: Never Used  Substance and Sexual Activity   Alcohol use: Not Currently    Comment: social   Drug use: No   Sexual activity: Not Currently    Birth control/protection: Post-menopausal  Other Topics Concern   Not on file  Social History Narrative   From [place] .   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   Widowed 12/2017.   Daughter local.   Social Drivers of Health   Financial Resource Strain: Low Risk  (03/12/2024)   Overall Financial Resource Strain (CARDIA)    Difficulty of Paying Living Expenses: Not very hard  Food Insecurity: No Food Insecurity (03/12/2024)   Hunger Vital Sign    Worried About Running Out of Food in the Last Year: Never true    Ran Out of Food in the Last Year: Never true  Transportation Needs: No Transportation Needs (03/12/2024)   PRAPARE - Administrator, Civil Service (Medical): No    Lack of Transportation (Non-Medical): No  Physical Activity: Insufficiently Active (03/12/2024)   Exercise Vital Sign    Days of Exercise per Week: 7 days    Minutes of Exercise per Session: 20 min  Stress: No Stress Concern Present (03/12/2024)   [employer] of Occupational Health - Occupational Stress Questionnaire    Feeling of Stress: Only a little  Social Connections: Moderately Isolated (03/12/2024)   Social Connection and Isolation Panel    Frequency  of Communication with Friends and Family: Once a week    Frequency of Social Gatherings with Friends and Family: More than three times a week    Attends Religious Services: Patient declined    Database administrator or Organizations: Yes    Attends Banker Meetings: 1 to 4 times per year    Marital Status: Widowed    Tobacco Counseling Counseling given: Not Answered Tobacco comments: Social smoker.  May not even smoke a pack per week.  01/25/2023 hfb    Clinical Intake:  Pre-visit preparation completed: Yes  Pain : No/denies pain     BMI - recorded: 24.98 Nutritional Risks: None Diabetes: No  Lab Results  Component Value Date   HGBA1C 4.9 12/23/2023   HGBA1C 5.2 12/22/2022   HGBA1C 5.0 02/24/2018     How often do you need to have someone help you when you read instructions, pamphlets, or other written materials from your doctor or pharmacy?: 1 - Never  Interpreter Needed?: No  Comments: pts daughter lives with her Information entered by :: B.Corinne Goucher,LPN   Activities of Daily Living     03/12/2024    9:41 AM  In your present state of health, do you have any difficulty performing the following activities:  Hearing? 0  Vision? 0  Difficulty concentrating or making decisions? 1  Walking or climbing stairs? 0  Dressing or bathing? 0  Doing errands, shopping? 0  Preparing Food and eating ? N  Using the Toilet? N  In the past six months, have you accidently leaked urine? N  Do you have problems with loss of bowel control? N  Managing your Medications? N  Managing your Finances? N  Housekeeping or managing your Housekeeping? N    Patient Care Team: Cleatus Arlyss RAMAN, MD as PCP - Diedre Curlene Agent, MD as Consulting Physician (Obstetrics and Gynecology) Myrna Adine Anes, MD as Consulting Physician (Ophthalmology)  I have updated your Care Teams any recent Medical Services you may have received from other providers in the past year.  Assessment:   This is a routine wellness examination for Abigail Marks.  Hearing/Vision screen Hearing Screening - Comments:: Pt says her hearing is good despite ringing in ears Vision Screening - Comments:: Pt says her vision is good w/glasses;readers only Dr Myrna   Goals Addressed             This Visit's Progress    DIET - EAT MORE FRUITS AND VEGETABLES   On track    03/16/24     Patient Stated       03/16/24-Stay healthy       Depression Screen     03/16/2024    3:47 PM 01/18/2023    2:10 PM 01/14/2023    1:09 PM 01/13/2022    2:42 PM 12/31/2021    1:07 PM 03/10/2018   11:50 AM 10/19/2016    8:21 AM  PHQ 2/9 Scores  PHQ - 2 Score 0 1 0 5 0 6 0  PHQ- 9 Score  13  20       Fall Risk     03/12/2024    9:41 AM 01/18/2023    2:10 PM 01/14/2023    1:11 PM 01/10/2023   12:18 PM 12/31/2021    1:09 PM  Fall Risk   Falls in the past year? 0 0 0 0 1  Number falls in past yr:  0 0 0 0  Injury with Fall? 0 0 0 0 0  Risk for fall due to : No Fall Risks No Fall Risks No Fall Risks  History of fall(s)  Follow up Falls prevention discussed;Education provided Falls evaluation completed Falls prevention discussed;Falls evaluation completed  Falls evaluation completed;Falls prevention discussed      Data saved with a previous flowsheet row definition    MEDICARE RISK AT HOME:  Medicare Risk at Home Any stairs in or around the home?: (Patient-Rptd) No If so, are there any without handrails?: (Patient-Rptd) No Home free of loose throw rugs in walkways, pet beds, electrical cords, etc?: (Patient-Rptd) Yes Adequate lighting in your home to reduce risk of falls?: (Patient-Rptd) Yes Life alert?: (Patient-Rptd) No Use of a cane, walker or w/c?: (Patient-Rptd) No Grab bars in the bathroom?: (Patient-Rptd) No Shower chair or bench in shower?: (Patient-Rptd) No Elevated toilet seat or a handicapped toilet?: (Patient-Rptd) Yes  TIMED UP AND GO:  Was the test performed?  No  Cognitive Function:  6CIT completed        03/16/2024    3:50 PM 01/14/2023    1:12 PM  6CIT Screen  What Year? 0 points 0 points  What month? 0 points 0 points  What time? 0 points 0 points  Count back from 20 0 points 0 points  Months in reverse 0 points 0 points  Repeat phrase 4 points 0 points  Total Score 4 points 0 points    Immunizations Immunization History  Administered Date(s) Administered   Fluad Trivalent(High Dose 65+) 05/21/2023   Influenza,inj,Quad PF,6+ Mos 06/01/2019   Influenza-Unspecified 06/05/2014, 05/18/2015, 06/18/2020   PFIZER(Purple Top)SARS-COV-2 Vaccination 08/21/2019, 09/11/2019, 08/13/2020   PNEUMOCOCCAL CONJUGATE-20 01/13/2022   Pneumococcal Polysaccharide-23 10/31/2020   Tdap 12/23/2012   Zoster, Live 08/18/2015    Screening Tests Health Maintenance  Topic Date Due   Zoster Vaccines- Shingrix (1 of 2) 08/14/2004   DTaP/Tdap/Td (2 - Td or Tdap) 12/24/2022   INFLUENZA VACCINE  03/17/2024   MAMMOGRAM  12/21/2024   Medicare Annual Wellness (AWV)  03/16/2025   Colonoscopy  02/14/2032   Pneumococcal Vaccine: 50+  Years  Completed   DEXA SCAN  Completed   Hepatitis C Screening  Completed   Hepatitis B Vaccines  Aged Out   HPV VACCINES  Aged Out   Meningococcal B Vaccine  Aged Out   COVID-19 Vaccine  Discontinued    Health Maintenance  Health Maintenance Due  Topic Date Due   Zoster Vaccines- Shingrix (1 of 2) 08/14/2004   DTaP/Tdap/Td (2 - Td or Tdap) 12/24/2022   Health Maintenance Items Addressed: None needed at this time:pt will go to pharmacy to get Shingles if she decides to receive it.  Additional Screening:  Vision Screening: Recommended annual ophthalmology exams for early detection of glaucoma and other disorders of the eye. Would you like a referral to an eye doctor? No    Dental Screening: Recommended annual dental exams for proper oral hygiene  Community Resource Referral / Chronic Care Management: CRR required this visit?  No   CCM  required this visit?  Appt scheduled with PCP   Plan:    I have personally reviewed and noted the following in the patient's chart:   Medical and social history Use of alcohol, tobacco or illicit drugs  Current medications and supplements including opioid prescriptions. Patient is not currently taking opioid prescriptions. Functional ability and status Nutritional status Physical activity Advanced directives List of other physicians Hospitalizations, surgeries, and ER visits in previous 12 months Vitals Screenings to include cognitive, depression, and falls Referrals and appointments  In addition, I have reviewed and discussed with patient certain preventive protocols, quality metrics, and best practice recommendations. A written personalized care plan for preventive services as well as general preventive health recommendations were provided to patient.   Erminio LITTIE Saris, LPN   2/68/7974   After Visit Summary: (MyChart) Due to this being a telephonic visit, the after visit summary with patients personalized plan was offered to patient via MyChart   Notes: Nothing significant to report at this time.

## 2024-03-16 NOTE — Patient Instructions (Addendum)
 Ms. Bozard , Thank you for taking time out of your busy schedule to complete your Annual Wellness Visit with me. I enjoyed our conversation and look forward to speaking with you again next year. I, as well as your care team,  appreciate your ongoing commitment to your health goals. Please review the following plan we discussed and let me know if I can assist you in the future. Your Game plan/ To Do List      Follow up Visits: We will see or speak with you next year for your Next Medicare AWV with our clinical staff :03/20/25 @ 3:40pm Have you seen your provider in the last 6 months (3 months if uncontrolled diabetes)? No  Clinician Recommendations:  Aim for 30 minutes of exercise or brisk walking, 6-8 glasses of water , and 5 servings of fruits and vegetables each day.       This is a list of the screenings recommended for you:  Health Maintenance  Topic Date Due   Zoster (Shingles) Vaccine (1 of 2) 08/14/2004   DTaP/Tdap/Td vaccine (2 - Td or Tdap) 12/24/2022   Flu Shot  03/17/2024   Mammogram  12/21/2024   Medicare Annual Wellness Visit  03/16/2025   Colon Cancer Screening  02/14/2032   Pneumococcal Vaccine for age over 18  Completed   DEXA scan (bone density measurement)  Completed   Hepatitis C Screening  Completed   Hepatitis B Vaccine  Aged Out   HPV Vaccine  Aged Out   Meningitis B Vaccine  Aged Out   COVID-19 Vaccine  Discontinued    Advanced directives: (Copy Requested) Please bring a copy of your health care power of attorney and living will to the office to be added to your chart at your convenience. You can mail to [facility] [address] [place], [place] [address] or email to [email or web address] Advance Care Planning is important because it:  [x]  Makes sure you receive the medical care that is consistent with your values, goals, and preferences  [x]  It provides guidance to your family and loved ones and reduces their decisional burden about  whether or not they are making the right decisions based on your wishes.  Follow the link provided in your after visit summary or read over the paperwork we have mailed to you to help you started getting your Advance Directives in place. If you need assistance in completing these, please reach out to us  so that we can help you!

## 2024-03-28 ENCOUNTER — Other Ambulatory Visit (HOSPITAL_COMMUNITY): Payer: Self-pay | Admitting: Obstetrics and Gynecology

## 2024-03-28 DIAGNOSIS — M810 Age-related osteoporosis without current pathological fracture: Secondary | ICD-10-CM

## 2024-04-03 ENCOUNTER — Telehealth: Payer: Self-pay

## 2024-04-03 ENCOUNTER — Ambulatory Visit
Admission: RE | Admit: 2024-04-03 | Discharge: 2024-04-03 | Disposition: A | Discharge status: Home / Self Care | Source: Ambulatory Visit | Attending: Obstetrics and Gynecology | Admitting: Obstetrics and Gynecology

## 2024-04-03 VITALS — BP 149/61 | HR 73 | Temp 97.5°F | Resp 18

## 2024-04-03 DIAGNOSIS — M810 Age-related osteoporosis without current pathological fracture: Secondary | ICD-10-CM | POA: Insufficient documentation

## 2024-04-03 DIAGNOSIS — Z79620 Long term (current) use of immunosuppressive biologic: Secondary | ICD-10-CM | POA: Diagnosis not present

## 2024-04-03 LAB — CALCIUM: Calcium: 9.7 mg/dL (ref 8.9–10.3)

## 2024-04-03 MED ORDER — DENOSUMAB 60 MG/ML ~~LOC~~ SOSY
60.0000 mg | PREFILLED_SYRINGE | SUBCUTANEOUS | Status: DC
  Administered 2024-04-03: 60 mg via SUBCUTANEOUS
  Filled 2024-04-03 (×2): qty 1

## 2024-04-03 NOTE — Telephone Encounter (Signed)
 Yes, please get her scheduled for a fasting lab visit.  I put in a future order for TSH and lipid panel.    Please verify her current levothyroxine  dose and pravastatin  dose.  Thanks.

## 2024-04-03 NOTE — Telephone Encounter (Signed)
 Copied from CRM #[id number]. Topic: Clinical - Medical Advice >> Apr 03, 2024  3:23 PM Thersia BROCKS wrote: Reason for CRM: Patient called in wanting to know if she needs to be scheduled for a lab for her thyroid  as she is still tired, would like a callback regarding this

## 2024-04-04 MED ORDER — PRAVASTATIN SODIUM 20 MG PO TABS: 20.0000 mg | ORAL_TABLET | Freq: Every day | ORAL | Status: DC

## 2024-04-04 NOTE — Telephone Encounter (Signed)
 Noted. Thanks.

## 2024-04-04 NOTE — Addendum Note (Signed)
 Addended by: CLEATUS ARLYSS RAMAN on: 04/04/2024 01:55 PM   Modules accepted: Orders

## 2024-04-04 NOTE — Telephone Encounter (Addendum)
 Spoke to pt she is scheduled for 04/10/24 @ 8:45am for fasting labs pt also confirmed that she is taking Levothoroxine 112 mcg and the Pravastatin  20 mg

## 2024-04-04 NOTE — Telephone Encounter (Signed)
 LM for pt to return call.

## 2024-04-10 ENCOUNTER — Other Ambulatory Visit (INDEPENDENT_AMBULATORY_CARE_PROVIDER_SITE_OTHER)

## 2024-04-10 DIAGNOSIS — E039 Hypothyroidism, unspecified: Secondary | ICD-10-CM | POA: Diagnosis not present

## 2024-04-10 DIAGNOSIS — E785 Hyperlipidemia, unspecified: Secondary | ICD-10-CM | POA: Diagnosis not present

## 2024-04-10 LAB — LIPID PANEL
Cholesterol: 165 mg/dL (ref 0–200)
HDL: 53 mg/dL (ref >39.00)
LDL Cholesterol: 96 mg/dL (ref 0–99)
NonHDL: 111.97
Total CHOL/HDL Ratio: 3
Triglycerides: 82 mg/dL (ref 0.0–149.0)
VLDL: 16.4 mg/dL (ref 0.0–40.0)

## 2024-04-10 LAB — TSH: TSH: 0.15 u[IU]/mL — ABNORMAL LOW (ref 0.35–5.50)

## 2024-04-12 ENCOUNTER — Ambulatory Visit: Payer: Self-pay | Admitting: Family Medicine

## 2024-05-01 ENCOUNTER — Ambulatory Visit: Admitting: Family Medicine

## 2024-05-01 ENCOUNTER — Encounter: Payer: Self-pay | Admitting: Family Medicine

## 2024-05-01 VITALS — BP 132/82 | HR 64 | Temp 98.5°F | Ht 63.0 in | Wt 140.6 lb

## 2024-05-01 DIAGNOSIS — R251 Tremor, unspecified: Secondary | ICD-10-CM | POA: Diagnosis not present

## 2024-05-01 DIAGNOSIS — Z7189 Other specified counseling: Secondary | ICD-10-CM

## 2024-05-01 DIAGNOSIS — E785 Hyperlipidemia, unspecified: Secondary | ICD-10-CM

## 2024-05-01 DIAGNOSIS — E039 Hypothyroidism, unspecified: Secondary | ICD-10-CM | POA: Diagnosis not present

## 2024-05-01 DIAGNOSIS — F32A Depression, unspecified: Secondary | ICD-10-CM

## 2024-05-01 DIAGNOSIS — Z23 Encounter for immunization: Secondary | ICD-10-CM

## 2024-05-01 DIAGNOSIS — Z0000 Encounter for general adult medical examination without abnormal findings: Secondary | ICD-10-CM

## 2024-05-01 DIAGNOSIS — M810 Age-related osteoporosis without current pathological fracture: Secondary | ICD-10-CM

## 2024-05-01 MED ORDER — FLUOXETINE HCL 20 MG PO CAPS: 60.0000 mg | ORAL_CAPSULE | Freq: Every day | ORAL | 3 refills | Status: AC

## 2024-05-01 MED ORDER — PRIMIDONE 50 MG PO TABS: 50.0000 mg | ORAL_TABLET | Freq: Every day | ORAL | 3 refills | Status: AC

## 2024-05-01 MED ORDER — AZITHROMYCIN 500 MG PO TABS: 500.0000 mg | ORAL_TABLET | Freq: Every day | ORAL | 0 refills | Status: AC

## 2024-05-01 MED ORDER — LEVOTHYROXINE SODIUM 112 MCG PO TABS: 112.0000 ug | ORAL_TABLET | Freq: Every day | ORAL | Status: DC

## 2024-05-01 MED ORDER — PRAVASTATIN SODIUM 20 MG PO TABS: 20.0000 mg | ORAL_TABLET | Freq: Every day | ORAL | 3 refills | Status: AC

## 2024-05-01 NOTE — Patient Instructions (Addendum)
 Please check with pulmonary about checking your OSA/CPAP numbers.   Flu shot today.  Tetanus when possible.  1/2 tab levothyroxine  on Sundays and recheck TSH in about 6-8 weeks.  Update me as needed, especially if the tremor isn't better.  Take care.  Glad to see you.

## 2024-05-01 NOTE — Progress Notes (Unsigned)
 Osteoporosis.  On prolia .  Vit D prev done.  No ADE on med. DXA 2024.    Taking primidone  at baseline.  Still with tremor, unclear if related to suppressed TSH.  No dysphagia.    Elevated Cholesterol: Using medications without problems:yes Muscle aches: no Diet compliance: yes Exercise:yes Labs d/w pt.   Mood d/w pt.  Still on prozac .  Sleep has been variable and that likely affects her mood.  Still using CPAP but still waking tired.    Taking 112mcg levothyroxine .  Low TSH.  Compliant.  Discussed.  Flu 2025 Shingles discussed with patient PNA 2023 Tetanus 2014, d/w pt.   Covid prev done Colonoscopy 2023 Breast cancer screening per gynecology Bone density test per gynecology Advance directive- Shanda Sweger would be designated if patient were incapacitated.   She travelling to the [place] for a medical trip.  I thanked her for her effort. D/w pt about zithromax  prn for traveller's diarrhea.   PMH and SH reviewed  Meds, vitals, and allergies reviewed.   ROS: Per HPI unless specifically indicated in ROS section   GEN: nad, alert and oriented HEENT: mucous membranes moist NECK: supple w/o LA CV: rrr. PULM: ctab, no inc wob ABD: soft, +bs EXT: no edema SKIN: well perfused.  B hand tremor noted.

## 2024-05-03 NOTE — Assessment & Plan Note (Signed)
 Would decrease levothyroxine  slightly based on TSH. Take 1/2 tab levothyroxine  on Sundays, 1 tab a day otherwise, and recheck TSH in about 6-8 weeks.  Update me as needed, especially if the tremor isn't better.

## 2024-05-03 NOTE — Assessment & Plan Note (Signed)
 Flu 2025 Shingles discussed with patient PNA 2023 Tetanus 2014, d/w pt.   Covid prev done Colonoscopy 2023 Breast cancer screening per gynecology Bone density test per gynecology Advance directive- Shanda Sweger would be designated if patient were incapacitated.   She travelling to the [place] for a medical trip.  I thanked her for her effort. D/w pt about zithromax  prn for traveller's diarrhea.

## 2024-05-03 NOTE — Assessment & Plan Note (Signed)
 On prolia .  Vit D prev done.  No ADE on med. DXA 2024.   Would continue as is.

## 2024-05-03 NOTE — Assessment & Plan Note (Signed)
 Still with tremor, unclear if related to suppressed TSH.  No dysphagia.  Continue primidone  for now.  We are addressing hypo versus hyperthyroidism.

## 2024-05-03 NOTE — Assessment & Plan Note (Signed)
Continue pravastatin.  Continue work on diet and exercise.

## 2024-05-03 NOTE — Assessment & Plan Note (Signed)
 Still on prozac .  Sleep has been variable and that likely affects her mood.  Still using CPAP but still waking tired.   Continue Prozac .  I asked her to check with pulmonary.

## 2024-05-03 NOTE — Assessment & Plan Note (Signed)
Advance directive- Vanessa Sweger would be designated if patient were incapacitated.  

## 2024-06-19 ENCOUNTER — Encounter: Payer: Self-pay | Admitting: Radiology

## 2024-07-19 DIAGNOSIS — H43813 Vitreous degeneration, bilateral: Secondary | ICD-10-CM | POA: Diagnosis not present

## 2024-07-19 DIAGNOSIS — H353131 Nonexudative age-related macular degeneration, bilateral, early dry stage: Secondary | ICD-10-CM | POA: Diagnosis not present

## 2024-07-19 DIAGNOSIS — Z961 Presence of intraocular lens: Secondary | ICD-10-CM | POA: Diagnosis not present

## 2024-09-04 ENCOUNTER — Other Ambulatory Visit: Payer: Self-pay | Admitting: Family Medicine

## 2024-09-04 DIAGNOSIS — E039 Hypothyroidism, unspecified: Secondary | ICD-10-CM

## 2024-09-04 NOTE — Telephone Encounter (Signed)
 Patient was supposed to come back for TSH recheck from sept 2025 visit; okay to refill?

## 2024-09-06 NOTE — Telephone Encounter (Signed)
 Sent with note to call for lab visit appointment.  Please check to make sure she gets a lab visit scheduled.  Thanks.

## 2024-09-14 ENCOUNTER — Other Ambulatory Visit

## 2024-09-14 DIAGNOSIS — E039 Hypothyroidism, unspecified: Secondary | ICD-10-CM

## 2024-09-14 LAB — TSH: TSH: 15.85 u[IU]/mL — ABNORMAL HIGH (ref 0.35–5.50)

## 2024-09-18 ENCOUNTER — Ambulatory Visit: Payer: Self-pay | Admitting: Family Medicine

## 2024-09-19 NOTE — Telephone Encounter (Signed)
 Left message to return call to office. Ok to [employer] below.

## 2024-09-20 ENCOUNTER — Other Ambulatory Visit (HOSPITAL_COMMUNITY): Payer: Self-pay | Admitting: Obstetrics and Gynecology

## 2024-09-20 ENCOUNTER — Other Ambulatory Visit: Payer: Self-pay | Admitting: Family Medicine

## 2024-09-20 DIAGNOSIS — E039 Hypothyroidism, unspecified: Secondary | ICD-10-CM

## 2024-09-20 MED ORDER — LEVOTHYROXINE SODIUM 112 MCG PO TABS: ORAL_TABLET | ORAL | 1 refills | Status: AC

## 2024-09-20 NOTE — Progress Notes (Signed)
 Last Prolia  dose 04/03/2024  Next dose is due 10/04/2024 Calcium on 08/19/2024 wnl (in [facility] tab)  Sherry Pennant, PharmD, MPH, BCPS, CPP Clinical Pharmacist

## 2024-09-20 NOTE — Telephone Encounter (Unsigned)
 Copied from CRM #[id number]. Topic: Clinical - Lab/Test Results >> Sep 19, 2024  5:02 PM China J wrote: Reason for CRM: Patient is returning a call for lab results. She wants Dr. Cleatus to know that she takes 112 MCG of her levothyroxine  once daily on an empty stomach along with a glass of water  30-60 minutes before breakfast. She does this routine for 6 days, and on Sunday, she takes half a tablet.  Please call [phone] for further information of next steps.

## 2024-11-22 ENCOUNTER — Other Ambulatory Visit

## 2025-03-20 ENCOUNTER — Ambulatory Visit
